# Patient Record
Sex: Female | Born: 1965 | Race: Black or African American | Hispanic: No | Marital: Single | Smoking: Never smoker
Health system: Southern US, Community
[De-identification: ages and names within clinical notes are randomized; demographics above are authoritative.]

## PROBLEM LIST (undated history)

## (undated) DIAGNOSIS — G8929 Other chronic pain: Secondary | ICD-10-CM

## (undated) DIAGNOSIS — E785 Hyperlipidemia, unspecified: Secondary | ICD-10-CM

## (undated) DIAGNOSIS — E669 Obesity, unspecified: Secondary | ICD-10-CM

## (undated) DIAGNOSIS — C801 Malignant (primary) neoplasm, unspecified: Secondary | ICD-10-CM

## (undated) DIAGNOSIS — I10 Essential (primary) hypertension: Secondary | ICD-10-CM

## (undated) DIAGNOSIS — Z8541 Personal history of malignant neoplasm of cervix uteri: Secondary | ICD-10-CM

## (undated) DIAGNOSIS — J45909 Unspecified asthma, uncomplicated: Secondary | ICD-10-CM

## (undated) DIAGNOSIS — Z8543 Personal history of malignant neoplasm of ovary: Secondary | ICD-10-CM

## (undated) HISTORY — DX: Obesity, unspecified: E66.9

## (undated) HISTORY — DX: Personal history of malignant neoplasm of cervix uteri: Z85.41

## (undated) HISTORY — DX: Personal history of malignant neoplasm of ovary: Z85.43

## (undated) HISTORY — DX: Unspecified asthma, uncomplicated: J45.909

## (undated) HISTORY — DX: Other chronic pain: G89.29

## (undated) HISTORY — DX: Hyperlipidemia, unspecified: E78.5

## (undated) HISTORY — PX: ABDOMINAL HYSTERECTOMY: SHX81

---

## 2009-04-14 ENCOUNTER — Encounter: Admission: RE | Admit: 2009-04-14 | Discharge: 2009-04-14 | Payer: Self-pay | Admitting: Internal Medicine

## 2010-05-01 ENCOUNTER — Emergency Department (HOSPITAL_COMMUNITY)
Admission: EM | Admit: 2010-05-01 | Discharge: 2010-05-01 | Payer: Self-pay | Source: Home / Self Care | Admitting: Emergency Medicine

## 2010-05-01 ENCOUNTER — Encounter (INDEPENDENT_AMBULATORY_CARE_PROVIDER_SITE_OTHER): Payer: Self-pay | Admitting: Emergency Medicine

## 2010-05-01 ENCOUNTER — Ambulatory Visit: Payer: Self-pay | Admitting: Surgery

## 2010-10-08 LAB — URIC ACID: Uric Acid, Serum: 8.2 mg/dL — ABNORMAL HIGH (ref 2.4–7.0)

## 2011-09-26 ENCOUNTER — Emergency Department (HOSPITAL_COMMUNITY): Payer: Medicare Other

## 2011-09-26 ENCOUNTER — Other Ambulatory Visit: Payer: Self-pay

## 2011-09-26 ENCOUNTER — Encounter (HOSPITAL_COMMUNITY): Payer: Self-pay | Admitting: Emergency Medicine

## 2011-09-26 ENCOUNTER — Emergency Department (HOSPITAL_COMMUNITY)
Admission: EM | Admit: 2011-09-26 | Discharge: 2011-09-26 | Disposition: A | Discharge status: Home / Self Care | Payer: Medicare Other | Attending: Emergency Medicine | Admitting: Emergency Medicine

## 2011-09-26 DIAGNOSIS — E119 Type 2 diabetes mellitus without complications: Secondary | ICD-10-CM | POA: Insufficient documentation

## 2011-09-26 DIAGNOSIS — E876 Hypokalemia: Secondary | ICD-10-CM

## 2011-09-26 DIAGNOSIS — I10 Essential (primary) hypertension: Secondary | ICD-10-CM | POA: Insufficient documentation

## 2011-09-26 DIAGNOSIS — R42 Dizziness and giddiness: Secondary | ICD-10-CM | POA: Insufficient documentation

## 2011-09-26 DIAGNOSIS — R5381 Other malaise: Secondary | ICD-10-CM | POA: Insufficient documentation

## 2011-09-26 DIAGNOSIS — Z79899 Other long term (current) drug therapy: Secondary | ICD-10-CM | POA: Insufficient documentation

## 2011-09-26 HISTORY — DX: Hyperlipidemia, unspecified: E78.5

## 2011-09-26 HISTORY — DX: Essential (primary) hypertension: I10

## 2011-09-26 HISTORY — DX: Malignant (primary) neoplasm, unspecified: C80.1

## 2011-09-26 LAB — DIFFERENTIAL
Basophils Absolute: 0 10*3/uL (ref 0.0–0.1)
Basophils Relative: 0 % (ref 0–1)
Eosinophils Absolute: 0.2 10*3/uL (ref 0.0–0.7)
Eosinophils Relative: 3 % (ref 0–5)
Monocytes Absolute: 0.5 10*3/uL (ref 0.1–1.0)
Neutro Abs: 5 10*3/uL (ref 1.7–7.7)
Neutrophils Relative %: 63 % (ref 43–77)

## 2011-09-26 LAB — POCT I-STAT, CHEM 8
BUN: 18 mg/dL (ref 6–23)
Chloride: 96 mEq/L (ref 96–112)
Creatinine, Ser: 1 mg/dL (ref 0.50–1.10)
Glucose, Bld: 87 mg/dL (ref 70–99)
HCT: 38 % (ref 36.0–46.0)
Hemoglobin: 12.9 g/dL (ref 12.0–15.0)
Potassium: 2.6 mEq/L — CL (ref 3.5–5.1)
Sodium: 140 mEq/L (ref 135–145)
TCO2: 33 mmol/L (ref 0–100)

## 2011-09-26 LAB — URINALYSIS, ROUTINE W REFLEX MICROSCOPIC
Bilirubin Urine: NEGATIVE
Glucose, UA: NEGATIVE mg/dL
Hgb urine dipstick: NEGATIVE
Ketones, ur: NEGATIVE mg/dL
Nitrite: NEGATIVE
Protein, ur: NEGATIVE mg/dL
Specific Gravity, Urine: 1.022 (ref 1.005–1.030)
Urobilinogen, UA: 1 mg/dL (ref 0.0–1.0)
pH: 6.5 (ref 5.0–8.0)

## 2011-09-26 LAB — COMPREHENSIVE METABOLIC PANEL
ALT: 15 U/L (ref 0–35)
AST: 21 U/L (ref 0–37)
Albumin: 3.9 g/dL (ref 3.5–5.2)
Alkaline Phosphatase: 83 U/L (ref 39–117)
Calcium: 9.9 mg/dL (ref 8.4–10.5)
Creatinine, Ser: 0.85 mg/dL (ref 0.50–1.10)
GFR calc Af Amer: >90 mL/min (ref >90)
Glucose, Bld: 87 mg/dL (ref 70–99)
Potassium: 2.5 mEq/L — CL (ref 3.5–5.1)
Sodium: 137 mEq/L (ref 135–145)
Total Bilirubin: 0.4 mg/dL (ref 0.3–1.2)
Total Protein: 8 g/dL (ref 6.0–8.3)

## 2011-09-26 LAB — GLUCOSE, CAPILLARY: Glucose-Capillary: 86 mg/dL (ref 70–99)

## 2011-09-26 LAB — CBC
MCH: 29.5 pg (ref 26.0–34.0)
MCHC: 34.3 g/dL (ref 30.0–36.0)
MCV: 86.1 fL (ref 78.0–100.0)
Platelets: 275 10*3/uL (ref 150–400)
RBC: 4.17 MIL/uL (ref 3.87–5.11)
RDW: 14.1 % (ref 11.5–15.5)

## 2011-09-26 MED ORDER — SODIUM CHLORIDE 0.9 % IV BOLUS (SEPSIS)
1000.0000 mL | Freq: Once | INTRAVENOUS | Status: AC
  Administered 2011-09-26: 1000 mL via INTRAVENOUS

## 2011-09-26 MED ORDER — POTASSIUM CHLORIDE CRYS ER 20 MEQ PO TBCR
40.0000 meq | EXTENDED_RELEASE_TABLET | Freq: Once | ORAL | Status: AC
  Administered 2011-09-26: 40 meq via ORAL
  Filled 2011-09-26: qty 2

## 2011-09-26 MED ORDER — MECLIZINE HCL 50 MG PO TABS: 50.0000 mg | ORAL_TABLET | Freq: Once | ORAL | Status: AC

## 2011-09-26 MED ORDER — POTASSIUM CHLORIDE 10 MEQ/100ML IV SOLN
10.0000 meq | Freq: Once | INTRAVENOUS | Status: AC
  Administered 2011-09-26: 10 meq via INTRAVENOUS
  Filled 2011-09-26: qty 100

## 2011-09-26 MED ORDER — MECLIZINE HCL 25 MG PO TABS
50.0000 mg | ORAL_TABLET | Freq: Once | ORAL | Status: AC
  Administered 2011-09-26: 50 mg via ORAL
  Filled 2011-09-26 (×2): qty 1

## 2011-09-26 NOTE — ED Notes (Signed)
VWU:[id number]<BJ> Expected date:09/26/11<BR> Expected time: 1:27 PM<BR> Means of arrival:Ambulance<BR> Comments:<BR> M4. 46 yo f. Dizzy, near syncope from church. 25 mins.

## 2011-09-26 NOTE — ED Notes (Signed)
Unable to tolerate IV potassium-stated it made her veins tight-MD informed-IV d/ced

## 2011-09-26 NOTE — Discharge Instructions (Signed)
Benign Positional Vertigo Vertigo means you feel like you or your surroundings are moving when they are not. Benign positional vertigo is the most common form of vertigo. Benign means that the cause of your condition is not serious. Benign positional vertigo is more common in older adults. CAUSES  Benign positional vertigo is the result of an upset in the labyrinth system. This is an area in the middle ear that helps control your balance. This may be caused by a viral infection, head injury, or repetitive motion. However, often no specific cause is found. SYMPTOMS  Symptoms of benign positional vertigo occur when you move your head or eyes in different directions. Some of the symptoms may include:  Loss of balance and falls.   Vomiting.   Blurred vision.   Dizziness.   Nausea.   Involuntary eye movements (nystagmus).  DIAGNOSIS  Benign positional vertigo is usually diagnosed by physical exam. If the specific cause of your benign positional vertigo is unknown, your caregiver may perform imaging tests, such as magnetic resonance imaging (MRI) or computed tomography (CT). TREATMENT  Your caregiver may recommend movements or procedures to correct the benign positional vertigo. Medicines such as meclizine, benzodiazepines, and medicines for nausea may be used to treat your symptoms. In rare cases, if your symptoms are caused by certain conditions that affect the inner ear, you may need surgery. HOME CARE INSTRUCTIONS   Follow your caregiver's instructions.   Move slowly. Do not make sudden body or head movements.   Avoid driving.   Avoid operating heavy machinery.   Avoid performing any tasks that would be dangerous to you or others during a vertigo episode.   Drink enough fluids to keep your urine clear or pale yellow.  SEEK IMMEDIATE MEDICAL CARE IF:   You develop problems with walking, weakness, numbness, or using your arms, hands, or legs.   You have difficulty speaking.   You  develop severe headaches.   Your nausea or vomiting continues or gets worse.   You develop visual changes.   Your family or friends notice any behavioral changes.   Your condition gets worse.   You have a fever.   You develop a stiff neck or sensitivity to light.  MAKE SURE YOU:   Understand these instructions.   Will watch your condition.   Will get help right away if you are not doing well or get worse.  Document Released: 04/19/2006 Document Revised: 07/01/2011 Document Reviewed: 04/01/2011 [facility] Patient Information 2012 [employer], [place].Hypokalemia Hypokalemia means a low potassium level in the blood.Potassium is an electrolyte that helps regulate the amount of fluid in the body. It also stimulates muscle contraction and maintains a stable acid-base balance.Most of the body's potassium is inside of cells, and only a very small amount is in the blood. Because the amount in the blood is so small, minor changes can have big effects. PREPARATION FOR TEST Testing for potassium requires taking a blood sample taken by needle from a vein in the arm. The skin is cleaned thoroughly before the sample is drawn. There is no other special preparation needed. NORMAL VALUES Potassium levels below 3.5 mEq/L are abnormally low. Levels above 5.1 mEq/L are abnormally high. Ranges for normal findings may vary among different laboratories and hospitals. You should always check with your doctor after having lab work or other tests done to discuss the meaning of your test results and whether your values are considered within normal limits. MEANING OF TEST  Your caregiver will go over  the test results with you and discuss the importance and meaning of your results, as well as treatment options and the need for additional tests, if necessary. A potassium level is frequently part of a routine medical exam. It is usually included as part of a whole "panel" of tests for several blood salts (such as Sodium  and Chloride). It may be done as part of follow-up when a low potassium level was found in the past or other blood salts are suspected of being out of balance. A low potassium level might be suspected if you have one or more of the following:  Symptoms of weakness.   Abnormal heart rhythms.   High blood pressure and are taking medication to control this, especially water pills (diuretics).   Kidney disease that can affect your potassium level .   Diabetes requiring the use of insulin. The potassium may fall after taking insulin, especially if the diabetes had been out of control for a while.   A condition requiring the use of cortisone-type medication or certain types of antibiotics.   Vomiting and/or diarrhea for more than a day or two.   A stomach or intestinal condition that may not permit appropriate absorption of potassium.   Fainting episodes.   Mental confusion.  OBTAINING TEST RESULTS It is your responsibility to obtain your test results. Ask the lab or department performing the test when and how you will get your results.  Please contact your caregiver directly if you have not received the results within one week. At that time, ask if there is anything different or new you should be doing in relation to the results. TREATMENT Hypokalemia can be treated with potassium supplements taken by mouth and/or adjustments in your current medications. A diet high in potassium is also helpful. Foods with high potassium content are:  Peas, lentils, lima beans, nuts, and dried fruit.   Whole grain and bran cereals and breads.   Fresh fruit, vegetables (bananas, cantaloupe, grapefruit, oranges, tomatoes, honeydew melons, potatoes).   Orange and tomato juices.   Meats. If potassium supplement has been prescribed for you today or your medications have been adjusted, see your personal caregiver in time02 for a re-check.  SEEK MEDICAL CARE IF:  There is a feeling of worsening  weakness.   You experience repeated chest palpitations.   You are diabetic and having difficulty keeping your blood sugars in the normal range.   You are experiencing vomiting and/or diarrhea.   You are having difficulty with any of your regular medications.  SEEK IMMEDIATE MEDICAL CARE IF:  You experience chest pain, shortness of breath, or episodes of dizziness.   You have been having vomiting or diarrhea for more than 2 days.   You have a fainting episode.  MAKE SURE YOU:   Understand these instructions.   Will watch your condition.   Will get help right away if you are not doing well or get worse.  Document Released: 07/12/2005 Document Revised: 07/01/2011 Document Reviewed: 06/22/2008 [employer] Patient Information 2012 [place], [place].

## 2011-09-26 NOTE — ED Notes (Signed)
Per EMS was at church and felt weak and dizzy upon standing

## 2011-09-26 NOTE — ED Notes (Signed)
Gave I Stat 8 results tp PA Thomasene Lot

## 2011-09-26 NOTE — ED Provider Notes (Signed)
History     CSN: [id number]  Arrival date & time 09/26/11  1405   First MD Initiated Contact with Patient 09/26/11 1501     HPI  Patient is a 46 y.o. female presenting with weakness.  Weakness The primary symptoms include dizziness. Primary symptoms do not include headaches, fever, nausea or vomiting.  Dizziness does not occur with nausea, vomiting or weakness.  Additional symptoms do not include weakness.    Past Medical History  Diagnosis Date  . Diabetes mellitus   . Hypertension   . Gout   . Hyperlipidemia   . Cancer     No past surgical history on file.  No family history on file.  History  Substance Use Topics  . Smoking status: Not on file  . Smokeless tobacco: Not on file  . Alcohol Use: No    OB History    Grav Para Term Preterm Abortions TAB SAB Ect Mult Living                  Review of Systems  Constitutional: Negative for fever and chills.  Respiratory: Negative for cough and shortness of breath.   Cardiovascular: Negative for chest pain.  Gastrointestinal: Negative for nausea, vomiting and abdominal pain.  Musculoskeletal: Negative for back pain.  Neurological: Positive for dizziness. Negative for facial asymmetry, speech difficulty, weakness, light-headedness, numbness and headaches.  All other systems reviewed and are negative.    Allergies  Ace inhibitors  Home Medications   Current Outpatient Rx  Name Route Sig Dispense Refill  . ALLOPURINOL 300 MG PO TABS Oral Take 300 mg by mouth daily.    . ATENOLOL 100 MG PO TABS Oral Take 100 mg by mouth daily.    . CHLORTHALIDONE 25 MG PO TABS Oral Take 25 mg by mouth daily.    . FUROSEMIDE 40 MG PO TABS Oral Take 40 mg by mouth daily.    Marland Kitchen LIRAGLUTIDE 18 MG/3ML [place] SOLN Subcutaneous Inject 1.8 mLs into the skin daily.    Marland Kitchen OVER THE COUNTER MEDICATION Oral Take 2 tablets by mouth daily. fiber    . POTASSIUM CHLORIDE CRYS ER 20 MEQ PO TBCR Oral Take 20 mEq by mouth daily.    Marland Kitchen ROSUVASTATIN  CALCIUM 10 MG PO TABS Oral Take 10 mg by mouth daily.    . TRAMADOL HCL 50 MG PO TABS Oral Take 50 mg by mouth daily.      BP 132/82  Pulse 71  Temp(Src) 98.2 F (36.8 C) (Oral)  Resp 22  SpO2 98%  Physical Exam  Vitals reviewed. Constitutional: She is oriented to person, place, and time. Vital signs are normal. She appears well-developed and well-nourished. No distress.  HENT:  Head: Normocephalic and atraumatic.  Right Ear: External ear normal.  Left Ear: External ear normal.  Nose: Nose normal.  Mouth/Throat: Oropharynx is clear and moist. No oropharyngeal exudate.  Eyes: Conjunctivae and EOM are normal. Pupils are equal, round, and reactive to light.  Neck: Neck supple.  Cardiovascular: Normal rate, regular rhythm and normal heart sounds.  Exam reveals no gallop and no friction rub.   No murmur heard. Pulmonary/Chest: Effort normal. She has no wheezes. She has no rales. She exhibits no tenderness.  Abdominal: Soft. Bowel sounds are normal. She exhibits no distension. There is no tenderness.  Lymphadenopathy:    She has no cervical adenopathy.  Neurological: She is alert and oriented to person, place, and time. No cranial nerve deficit. She exhibits normal muscle tone.  Coordination normal.       Symptoms reproducible when sitting patient up  Skin: Skin is warm and dry. No rash noted. No erythema. No pallor.  Psychiatric: She has a normal mood and affect. Her behavior is normal.    ED Course  Procedures   Results for orders placed during the hospital encounter of 09/26/11  GLUCOSE, CAPILLARY      Component Value Range   Glucose-Capillary 86  70 - 99 (mg/dL)   Comment 1 Notify RN    CBC      Component Value Range   WBC 7.9  4.0 - 10.5 (K/uL)   RBC 4.17  3.87 - 5.11 (MIL/uL)   Hemoglobin 12.3  12.0 - 15.0 (g/dL)   HCT 40.1 (*) 02.7 - 46.0 (%)   MCV 86.1  78.0 - 100.0 (fL)   MCH 29.5  26.0 - 34.0 (pg)   MCHC 34.3  30.0 - 36.0 (g/dL)   RDW 25.3  66.4 - 40.3 (%)    Platelets 275  150 - 400 (K/uL)  DIFFERENTIAL      Component Value Range   Neutrophils Relative 63  43 - 77 (%)   Neutro Abs 5.0  1.7 - 7.7 (K/uL)   Lymphocytes Relative 28  12 - 46 (%)   Lymphs Abs 2.2  0.7 - 4.0 (K/uL)   Monocytes Relative 7  3 - 12 (%)   Monocytes Absolute 0.5  0.1 - 1.0 (K/uL)   Eosinophils Relative 3  0 - 5 (%)   Eosinophils Absolute 0.2  0.0 - 0.7 (K/uL)   Basophils Relative 0  0 - 1 (%)   Basophils Absolute 0.0  0.0 - 0.1 (K/uL)  COMPREHENSIVE METABOLIC PANEL      Component Value Range   Sodium 137  135 - 145 (mEq/L)   Potassium 2.5 (*) 3.5 - 5.1 (mEq/L)   Chloride 94 (*) 96 - 112 (mEq/L)   CO2 35 (*) 19 - 32 (mEq/L)   Glucose, Bld 87  70 - 99 (mg/dL)   BUN 18  6 - 23 (mg/dL)   Creatinine, Ser 4.74  0.50 - 1.10 (mg/dL)   Calcium 9.9  8.4 - 25.9 (mg/dL)   Total Protein 8.0  6.0 - 8.3 (g/dL)   Albumin 3.9  3.5 - 5.2 (g/dL)   AST 21  0 - 37 (U/L)   ALT 15  0 - 35 (U/L)   Alkaline Phosphatase 83  39 - 117 (U/L)   Total Bilirubin 0.4  0.3 - 1.2 (mg/dL)   GFR calc non Af Amer 81 (*) >90 (mL/min)   GFR calc Af Amer >90  >90 (mL/min)  URINALYSIS, ROUTINE W REFLEX MICROSCOPIC      Component Value Range   Color, Urine YELLOW  YELLOW    APPearance CLEAR  CLEAR    Specific Gravity, Urine 1.022  1.005 - 1.030    pH 6.5  5.0 - 8.0    Glucose, UA NEGATIVE  NEGATIVE (mg/dL)   Hgb urine dipstick NEGATIVE  NEGATIVE    Bilirubin Urine NEGATIVE  NEGATIVE    Ketones, ur NEGATIVE  NEGATIVE (mg/dL)   Protein, ur NEGATIVE  NEGATIVE (mg/dL)   Urobilinogen, UA 1.0  0.0 - 1.0 (mg/dL)   Nitrite NEGATIVE  NEGATIVE    Leukocytes, UA NEGATIVE  NEGATIVE   POCT I-STAT, CHEM 8      Component Value Range   Sodium 140  135 - 145 (mEq/L)   Potassium 2.6 (*) 3.5 - 5.1 (mEq/L)  Chloride 96  96 - 112 (mEq/L)   BUN 18  6 - 23 (mg/dL)   Creatinine, Ser 1.61  0.50 - 1.10 (mg/dL)   Glucose, Bld 87  70 - 99 (mg/dL)   Calcium, Ion 0.96  0.45 - 1.32 (mmol/L)   TCO2 33  0 - 100  (mmol/L)   Hemoglobin 12.9  12.0 - 15.0 (g/dL)   HCT 40.9  81.1 - 91.4 (%)   Comment NOTIFIED PHYSICIAN     Ct Head Wo Contrast  09/26/2011  *RADIOLOGY REPORT*  Clinical Data: Weak and dizzy  CT HEAD WITHOUT CONTRAST  Technique:  Contiguous axial images were obtained from the base of the skull through the vertex without contrast.  Comparison: None.  Findings: Ventricle size is normal.  Negative for hemorrhage, mass, or infarct.  No edema or mass effect is present.  Calvarium is intact.  IMPRESSION: Normal  Original Report Authenticated By: Camelia Phenes, M.D.     Date: 09/26/2011  Rate: 57  Rhythm: normal sinus rhythm  QRS Axis: normal  Intervals: normal  ST/T Wave abnormalities: normal  Conduction Disutrbances: none  Narrative Interpretation: NML    MDM    5:20 PM Patient reports symptoms significantly improved after medication. Patient ambulated without difficulty and without vertigo. Will discharge home and advised to take meclizine. 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 Patient reports IV was uncomfortable and requests to have it removed prior to having potassium infusion. She has hypokalemia. Has not had EKG changes. Patient states she did not take her potassium is 4. Advised taking her regular dose when she returns home as well. Pt voices understanding and is ready for discharge.      Thomasene Lot, PA-C 09/26/11 1748  Thomasene Lot, PA-C 09/26/11 1749

## 2011-09-27 NOTE — ED Provider Notes (Signed)
Medical screening examination/treatment/procedure(s) were performed by non-physician practitioner and as supervising physician I was immediately available for consultation/collaboration.  Gerhard Munch, MD 09/27/11 Rich Fuchs

## 2015-10-16 DIAGNOSIS — R0602 Shortness of breath: Secondary | ICD-10-CM | POA: Diagnosis not present

## 2015-10-16 DIAGNOSIS — G43909 Migraine, unspecified, not intractable, without status migrainosus: Secondary | ICD-10-CM | POA: Diagnosis not present

## 2015-10-16 DIAGNOSIS — E1140 Type 2 diabetes mellitus with diabetic neuropathy, unspecified: Secondary | ICD-10-CM | POA: Diagnosis not present

## 2015-10-16 DIAGNOSIS — I10 Essential (primary) hypertension: Secondary | ICD-10-CM | POA: Diagnosis not present

## 2015-10-21 ENCOUNTER — Other Ambulatory Visit: Payer: Self-pay

## 2015-10-21 DIAGNOSIS — Z1231 Encounter for screening mammogram for malignant neoplasm of breast: Secondary | ICD-10-CM

## 2015-10-24 ENCOUNTER — Ambulatory Visit
Admission: RE | Admit: 2015-10-24 | Discharge: 2015-10-24 | Disposition: A | Discharge status: Home / Self Care | Payer: Medicare Other | Source: Ambulatory Visit

## 2015-10-24 DIAGNOSIS — Z1231 Encounter for screening mammogram for malignant neoplasm of breast: Secondary | ICD-10-CM | POA: Diagnosis not present

## 2015-11-12 DIAGNOSIS — J029 Acute pharyngitis, unspecified: Secondary | ICD-10-CM | POA: Diagnosis not present

## 2015-11-12 DIAGNOSIS — R0982 Postnasal drip: Secondary | ICD-10-CM | POA: Diagnosis not present

## 2015-11-19 DIAGNOSIS — E7800 Pure hypercholesterolemia, unspecified: Secondary | ICD-10-CM | POA: Diagnosis not present

## 2015-11-19 DIAGNOSIS — R0602 Shortness of breath: Secondary | ICD-10-CM | POA: Diagnosis not present

## 2015-11-19 DIAGNOSIS — I10 Essential (primary) hypertension: Secondary | ICD-10-CM | POA: Diagnosis not present

## 2015-11-19 DIAGNOSIS — R079 Chest pain, unspecified: Secondary | ICD-10-CM | POA: Diagnosis not present

## 2015-11-24 DIAGNOSIS — R0789 Other chest pain: Secondary | ICD-10-CM | POA: Diagnosis not present

## 2015-12-02 DIAGNOSIS — R0609 Other forms of dyspnea: Secondary | ICD-10-CM | POA: Diagnosis not present

## 2015-12-09 DIAGNOSIS — R0602 Shortness of breath: Secondary | ICD-10-CM | POA: Diagnosis not present

## 2015-12-09 DIAGNOSIS — I10 Essential (primary) hypertension: Secondary | ICD-10-CM | POA: Diagnosis not present

## 2015-12-09 DIAGNOSIS — R079 Chest pain, unspecified: Secondary | ICD-10-CM | POA: Diagnosis not present

## 2015-12-09 DIAGNOSIS — E7800 Pure hypercholesterolemia, unspecified: Secondary | ICD-10-CM | POA: Diagnosis not present

## 2016-02-10 DIAGNOSIS — Z0000 Encounter for general adult medical examination without abnormal findings: Secondary | ICD-10-CM | POA: Diagnosis not present

## 2016-02-10 DIAGNOSIS — E559 Vitamin D deficiency, unspecified: Secondary | ICD-10-CM | POA: Diagnosis not present

## 2016-02-10 DIAGNOSIS — R413 Other amnesia: Secondary | ICD-10-CM | POA: Diagnosis not present

## 2016-02-10 DIAGNOSIS — I10 Essential (primary) hypertension: Secondary | ICD-10-CM | POA: Diagnosis not present

## 2016-02-10 DIAGNOSIS — E1140 Type 2 diabetes mellitus with diabetic neuropathy, unspecified: Secondary | ICD-10-CM | POA: Diagnosis not present

## 2017-03-20 ENCOUNTER — Inpatient Hospital Stay: Payer: Self-pay

## 2017-03-20 ENCOUNTER — Encounter: Payer: Self-pay | Admitting: Emergency Medicine

## 2017-03-20 ENCOUNTER — Observation Stay
Admission: EM | Admit: 2017-03-20 | Discharge: 2017-03-21 | Disposition: A | Discharge status: Home / Self Care | Payer: Self-pay | Attending: Internal Medicine | Admitting: Internal Medicine

## 2017-03-20 DIAGNOSIS — R0600 Dyspnea, unspecified: Secondary | ICD-10-CM

## 2017-03-20 DIAGNOSIS — L509 Urticaria, unspecified: Secondary | ICD-10-CM | POA: Insufficient documentation

## 2017-03-20 DIAGNOSIS — E119 Type 2 diabetes mellitus without complications: Secondary | ICD-10-CM | POA: Insufficient documentation

## 2017-03-20 DIAGNOSIS — T783XXA Angioneurotic edema, initial encounter: Secondary | ICD-10-CM

## 2017-03-20 DIAGNOSIS — Z79899 Other long term (current) drug therapy: Secondary | ICD-10-CM | POA: Insufficient documentation

## 2017-03-20 DIAGNOSIS — J0510 Acute epiglottitis without obstruction: Secondary | ICD-10-CM

## 2017-03-20 DIAGNOSIS — Z794 Long term (current) use of insulin: Secondary | ICD-10-CM | POA: Insufficient documentation

## 2017-03-20 DIAGNOSIS — T7840XA Allergy, unspecified, initial encounter: Principal | ICD-10-CM | POA: Insufficient documentation

## 2017-03-20 DIAGNOSIS — I10 Essential (primary) hypertension: Secondary | ICD-10-CM | POA: Insufficient documentation

## 2017-03-20 DIAGNOSIS — M109 Gout, unspecified: Secondary | ICD-10-CM | POA: Insufficient documentation

## 2017-03-20 DIAGNOSIS — R061 Stridor: Secondary | ICD-10-CM

## 2017-03-20 DIAGNOSIS — Z7982 Long term (current) use of aspirin: Secondary | ICD-10-CM | POA: Insufficient documentation

## 2017-03-20 DIAGNOSIS — E785 Hyperlipidemia, unspecified: Secondary | ICD-10-CM | POA: Insufficient documentation

## 2017-03-20 DIAGNOSIS — T782XXA Anaphylactic shock, unspecified, initial encounter: Secondary | ICD-10-CM | POA: Insufficient documentation

## 2017-03-20 LAB — CBC
HCT: 41.5 % (ref 35.0–47.0)
Hemoglobin: 14.2 g/dL (ref 12.0–16.0)
MCH: 28.9 pg (ref 26.0–34.0)
MCHC: 34.3 g/dL (ref 32.0–36.0)
MCV: 84.2 fL (ref 80.0–100.0)
PLATELETS: 371 10*3/uL (ref 150–440)
RBC: 4.93 MIL/uL (ref 3.80–5.20)
RDW: 14.6 % — ABNORMAL HIGH (ref 11.5–14.5)
WBC: 10.2 10*3/uL (ref 3.6–11.0)

## 2017-03-20 LAB — BASIC METABOLIC PANEL
ANION GAP: 8 (ref 5–15)
BUN: 16 mg/dL (ref 6–20)
CO2: 24 mmol/L (ref 22–32)
Calcium: 8.7 mg/dL — ABNORMAL LOW (ref 8.9–10.3)
Chloride: 106 mmol/L (ref 101–111)
Creatinine, Ser: 0.8 mg/dL (ref 0.44–1.00)
GFR calc Af Amer: >60 mL/min (ref >60)
GFR calc non Af Amer: >60 mL/min (ref >60)
GLUCOSE: 137 mg/dL — AB (ref 65–99)
POTASSIUM: 3.9 mmol/L (ref 3.5–5.1)
SODIUM: 138 mmol/L (ref 135–145)

## 2017-03-20 LAB — GLUCOSE, CAPILLARY
Glucose-Capillary: 166 mg/dL — ABNORMAL HIGH (ref 65–99)
Glucose-Capillary: 198 mg/dL — ABNORMAL HIGH (ref 65–99)

## 2017-03-20 MED ORDER — TRAMADOL HCL 50 MG PO TABS
50.0000 mg | ORAL_TABLET | Freq: Every day | ORAL | Status: DC
  Administered 2017-03-20 – 2017-03-21 (×2): 50 mg via ORAL
  Filled 2017-03-20 (×2): qty 1

## 2017-03-20 MED ORDER — ATENOLOL 100 MG PO TABS
100.0000 mg | ORAL_TABLET | Freq: Every day | ORAL | Status: DC
  Administered 2017-03-20 – 2017-03-21 (×2): 100 mg via ORAL
  Filled 2017-03-20 (×2): qty 1

## 2017-03-20 MED ORDER — EPINEPHRINE 0.3 MG/0.3ML IJ SOAJ
INTRAMUSCULAR | Status: AC
  Filled 2017-03-20: qty 0.3

## 2017-03-20 MED ORDER — EPINEPHRINE 0.3 MG/0.3ML IJ SOAJ
INTRAMUSCULAR | Status: AC
  Administered 2017-03-20: 0.3 mg via INTRAMUSCULAR
  Filled 2017-03-20: qty 0.3

## 2017-03-20 MED ORDER — INSULIN ASPART 100 UNIT/ML ~~LOC~~ SOLN
0.0000 [IU] | Freq: Three times a day (TID) | SUBCUTANEOUS | Status: DC
  Administered 2017-03-20: 2 [IU] via SUBCUTANEOUS
  Administered 2017-03-21 (×2): 3 [IU] via SUBCUTANEOUS
  Filled 2017-03-20 (×3): qty 1

## 2017-03-20 MED ORDER — EPINEPHRINE PF 1 MG/ML IJ SOLN
INTRAMUSCULAR | Status: AC
  Filled 2017-03-20: qty 1

## 2017-03-20 MED ORDER — SODIUM CHLORIDE 0.9 % IV SOLN
25.0000 mg | INTRAVENOUS | Status: DC | PRN
  Administered 2017-03-21: 25 mg via INTRAVENOUS
  Filled 2017-03-20: qty 0.5

## 2017-03-20 MED ORDER — EPINEPHRINE 0.3 MG/0.3ML IJ SOAJ
0.3000 mg | Freq: Once | INTRAMUSCULAR | Status: AC
  Administered 2017-03-20: 0.3 mg via INTRAMUSCULAR

## 2017-03-20 MED ORDER — ASPIRIN EC 81 MG PO TBEC
81.0000 mg | DELAYED_RELEASE_TABLET | Freq: Every day | ORAL | Status: DC
  Administered 2017-03-20 – 2017-03-21 (×2): 81 mg via ORAL
  Filled 2017-03-20 (×2): qty 1

## 2017-03-20 MED ORDER — ALLOPURINOL 300 MG PO TABS
300.0000 mg | ORAL_TABLET | Freq: Every day | ORAL | Status: DC
  Administered 2017-03-20 – 2017-03-21 (×2): 300 mg via ORAL
  Filled 2017-03-20 (×2): qty 1

## 2017-03-20 MED ORDER — ALBUTEROL SULFATE (2.5 MG/3ML) 0.083% IN NEBU
2.5000 mg | INHALATION_SOLUTION | Freq: Once | RESPIRATORY_TRACT | Status: AC
  Administered 2017-03-20: 2.5 mg via RESPIRATORY_TRACT
  Filled 2017-03-20: qty 3

## 2017-03-20 MED ORDER — FAMOTIDINE IN NACL 20-0.9 MG/50ML-% IV SOLN
20.0000 mg | Freq: Once | INTRAVENOUS | Status: AC
  Administered 2017-03-20: 20 mg via INTRAVENOUS

## 2017-03-20 MED ORDER — POTASSIUM 99 MG PO TABS: 1.0000 | ORAL_TABLET | Freq: Every day | ORAL | Status: DC

## 2017-03-20 MED ORDER — CHLORTHALIDONE 25 MG PO TABS
25.0000 mg | ORAL_TABLET | Freq: Every day | ORAL | Status: DC
  Administered 2017-03-20 – 2017-03-21 (×2): 25 mg via ORAL
  Filled 2017-03-20 (×2): qty 1

## 2017-03-20 MED ORDER — METHYLPREDNISOLONE SODIUM SUCC 125 MG IJ SOLR
60.0000 mg | Freq: Three times a day (TID) | INTRAMUSCULAR | Status: DC
  Administered 2017-03-20 – 2017-03-21 (×3): 60 mg via INTRAVENOUS
  Filled 2017-03-20 (×3): qty 2

## 2017-03-20 MED ORDER — ACETAMINOPHEN 650 MG RE SUPP: 650.0000 mg | Freq: Four times a day (QID) | RECTAL | Status: DC | PRN

## 2017-03-20 MED ORDER — DIPHENHYDRAMINE HCL 50 MG/ML IJ SOLN
50.0000 mg | Freq: Once | INTRAMUSCULAR | Status: AC
  Administered 2017-03-20: 50 mg via INTRAVENOUS

## 2017-03-20 MED ORDER — METHYLPREDNISOLONE SODIUM SUCC 125 MG IJ SOLR
125.0000 mg | Freq: Once | INTRAMUSCULAR | Status: AC
  Administered 2017-03-20: 125 mg via INTRAVENOUS

## 2017-03-20 MED ORDER — LIRAGLUTIDE 18 MG/3ML ~~LOC~~ SOLN: 10.8000 mg | Freq: Every day | SUBCUTANEOUS | Status: DC

## 2017-03-20 MED ORDER — POTASSIUM CHLORIDE CRYS ER 20 MEQ PO TBCR
20.0000 meq | EXTENDED_RELEASE_TABLET | Freq: Every day | ORAL | Status: DC
  Administered 2017-03-20 – 2017-03-21 (×2): 20 meq via ORAL
  Filled 2017-03-20 (×2): qty 1

## 2017-03-20 MED ORDER — FUROSEMIDE 40 MG PO TABS
40.0000 mg | ORAL_TABLET | Freq: Every day | ORAL | Status: DC
  Administered 2017-03-20 – 2017-03-21 (×2): 40 mg via ORAL
  Filled 2017-03-20 (×2): qty 1

## 2017-03-20 MED ORDER — METHYLPREDNISOLONE SODIUM SUCC 125 MG IJ SOLR
INTRAMUSCULAR | Status: AC
  Filled 2017-03-20: qty 2

## 2017-03-20 MED ORDER — EPINEPHRINE 0.3 MG/0.3ML IJ SOAJ
0.3000 mg | INTRAMUSCULAR | Status: DC | PRN
  Filled 2017-03-20: qty 0.3

## 2017-03-20 MED ORDER — ACETAMINOPHEN 325 MG PO TABS: 650.0000 mg | ORAL_TABLET | Freq: Four times a day (QID) | ORAL | Status: DC | PRN

## 2017-03-20 MED ORDER — ROSUVASTATIN CALCIUM 10 MG PO TABS
10.0000 mg | ORAL_TABLET | Freq: Every day | ORAL | Status: DC
  Administered 2017-03-20 – 2017-03-21 (×2): 10 mg via ORAL
  Filled 2017-03-20 (×2): qty 1

## 2017-03-20 MED ORDER — FAMOTIDINE 20 MG PO TABS
20.0000 mg | ORAL_TABLET | Freq: Two times a day (BID) | ORAL | Status: DC
  Administered 2017-03-20 – 2017-03-21 (×2): 20 mg via ORAL
  Filled 2017-03-20 (×2): qty 1

## 2017-03-20 MED ORDER — FERROUS SULFATE 325 (65 FE) MG PO TABS
325.0000 mg | ORAL_TABLET | Freq: Every day | ORAL | Status: DC
  Administered 2017-03-21: 325 mg via ORAL
  Filled 2017-03-20: qty 1

## 2017-03-20 MED ORDER — DIPHENHYDRAMINE HCL 50 MG/ML IJ SOLN
INTRAMUSCULAR | Status: AC
  Filled 2017-03-20: qty 1

## 2017-03-20 MED ORDER — ENOXAPARIN SODIUM 40 MG/0.4ML ~~LOC~~ SOLN
40.0000 mg | Freq: Two times a day (BID) | SUBCUTANEOUS | Status: DC
  Administered 2017-03-20 – 2017-03-21 (×2): 40 mg via SUBCUTANEOUS
  Filled 2017-03-20 (×2): qty 0.4

## 2017-03-20 MED ORDER — SENNOSIDES-DOCUSATE SODIUM 8.6-50 MG PO TABS: 1.0000 | ORAL_TABLET | Freq: Every evening | ORAL | Status: DC | PRN

## 2017-03-20 MED ORDER — EPINEPHRINE PF 1 MG/10ML IJ SOSY
PREFILLED_SYRINGE | INTRAMUSCULAR | Status: AC
  Filled 2017-03-20: qty 10

## 2017-03-20 MED ORDER — FAMOTIDINE IN NACL 20-0.9 MG/50ML-% IV SOLN
INTRAVENOUS | Status: AC
  Administered 2017-03-20: 20 mg via INTRAVENOUS
  Filled 2017-03-20: qty 50

## 2017-03-20 MED ORDER — DIPHENHYDRAMINE HCL 50 MG/ML IJ SOLN
50.0000 mg | Freq: Once | INTRAMUSCULAR | Status: AC
  Administered 2017-03-20: 50 mg via INTRAVENOUS
  Filled 2017-03-20: qty 1

## 2017-03-20 MED ORDER — ALBUTEROL SULFATE (2.5 MG/3ML) 0.083% IN NEBU
2.5000 mg | INHALATION_SOLUTION | RESPIRATORY_TRACT | Status: DC | PRN
  Administered 2017-03-20: 2.5 mg via RESPIRATORY_TRACT
  Filled 2017-03-20: qty 3

## 2017-03-20 NOTE — H&P (Signed)
[facility] [place] NAME: Nancy Pham    MR#:  [id number]  DATE OF BIRTH:  November 15, 1965  DATE OF ADMISSION:  03/20/2017  PRIMARY CARE PHYSICIAN: Patient, No Pcp Per   REQUESTING/REFERRING PHYSICIAN: Rip Harbour  CHIEF COMPLAINT:  Hives, swelling of the face  HISTORY OF PRESENT ILLNESS:  Nancy Pham  is a 51 y.o. female with a known history of diabetes, hyperlipidemia,hypertension,asthma and obesity is presenting to the ED with a chief complaint of peptic reaction. Patient had a new brand of pineapple juice on Friday following which she noticed hives on her body. Patient took Benadryl multiple times on Friday night and started feeling better on Saturday, but by Saturday evening she started noticing hives and swelling of her face eyelids, lips associated with the whole body itching and tightness in her chest. As patient was short of breath she came to the ED today morning  patient was given EpiPen,Solu-Medrol, [place] Pepcid and hospitalist team is called to admit the patient. During my examination patient is reporting tightness in her chest associated with hoarseness of voice. Reports difficulty swallowing  PAST MEDICAL HISTORY:   Past Medical History:  Diagnosis Date  . Cancer ([place])   . Diabetes mellitus   . Gout   . Hyperlipidemia   . Hypertension     PAST SURGICAL HISTOIRY:  History reviewed. No pertinent surgical history.  SOCIAL HISTORY:   Social History  Substance Use Topics  . Smoking status: Not on file  . Smokeless tobacco: Not on file  . Alcohol use No    FAMILY HISTORY:  History reviewed. No pertinent family history.  DRUG ALLERGIES:   Allergies  Allergen Reactions  . Ace Inhibitors Swelling    REVIEW OF SYSTEMS:  CONSTITUTIONAL: No fever, fatigue or weakness.  EYES: No blurred or double vision.  EARS, NOSE, AND THROAT: No tinnitus or ear pain. Reporting hoarse voice RESPIRATORY: No cough,patient is  reporting tightness in her chest and shortness of breath, reporting minimalwheezing , no hemoptysis.  CARDIOVASCULAR: No chest pain, orthopnea, edema.  GASTROINTESTINAL: No nausea, vomiting, diarrhea or abdominal pain.  GENITOURINARY: No dysuria, hematuria.  ENDOCRINE: No polyuria, nocturia,  HEMATOLOGY: No anemia, easy bruising or bleeding SKIN: reporting diffuse rash with itching MUSCULOSKELETAL: No joint pain or arthritis.   NEUROLOGIC: No tingling, numbness, weakness.  PSYCHIATRY: No anxiety or depression.   MEDICATIONS AT HOME:   Prior to Admission medications   Medication Sig Start Date End Date Taking? Authorizing Provider  aspirin EC 81 MG tablet Take 81 mg by mouth.   Yes [provider]  atenolol (TENORMIN) 100 MG tablet Take 100 mg by mouth daily.   Yes [provider]  ferrous sulfate 325 (65 FE) MG tablet Take 325 mg by mouth daily with breakfast.   Yes [provider]  Liraglutide (VICTOZA) 18 MG/3ML SOLN Inject 1.8 mLs into the skin daily.   Yes [provider]  Potassium 99 MG TABS Take 1 tablet by mouth daily.   Yes [provider]  traMADol (ULTRAM) 50 MG tablet Take 50 mg by mouth daily.   Yes [provider]  allopurinol (ZYLOPRIM) 300 MG tablet Take 300 mg by mouth daily.    [provider]  chlorthalidone (HYGROTON) 25 MG tablet Take 25 mg by mouth daily.    [provider]  furosemide (LASIX) 40 MG tablet Take 40 mg by mouth daily.    [provider]  potassium chloride SA (  K-DUR,KLOR-CON) 20 MEQ tablet Take 20 mEq by mouth daily.    [provider]  rosuvastatin (CRESTOR) 10 MG tablet Take 10 mg by mouth daily.    [provider]      VITAL SIGNS:  Blood pressure 116/80, pulse 83, resp. rate 17, SpO2 95 %.  PHYSICAL EXAMINATION:  GENERAL:  51 y.o.-year-old patient lying in the bed with no acute distress.  EYES: Pupils equal, round, reactive to light and  accommodation. No scleral icterus. Extraocular muscles intact.  HEENT: Head atraumatic, normocephalic. Oropharynx and nasopharynx clear. Lips and eyelids are edematous No tongue swelling noticed, hoarse voice NECK:  Supple, no jugular venous distention. No thyroid enlargement, no tenderness.  LUNGS: moderatebreath sounds bilaterally, diffuse wheezing, no rales,rhonchi or crepitation. No use of accessory muscles of respiration.  CARDIOVASCULAR: S1, S2 normal. No murmurs, rubs, or gallops.  ABDOMEN: Soft, nontender, nondistended. Bowel sounds present. No organomegaly or mass.  EXTREMITIES: No pedal edema, cyanosis, or clubbing.  NEUROLOGIC: Cranial nerves II through XII are intact. Muscle strength 5/5 in all extremities. Sensation intact. Gait not checked.  PSYCHIATRIC: The patient is alert and ORIENTED SKIN : DIFFUSE HIVES  LABORATORY PANEL:   CBC  Recent Labs Lab 03/20/17 1059  WBC 10.2  HGB 14.2  HCT 41.5  PLT 371   ------------------------------------------------------------------------------------------------------------------  Chemistries   Recent Labs Lab 03/20/17 1059  NA 138  K 3.9  CL 106  CO2 24  GLUCOSE 137*  BUN 16  CREATININE 0.80  CALCIUM 8.7*   ------------------------------------------------------------------------------------------------------------------  Cardiac Enzymes No results for input(s): TROPONINI in the last 168 hours. ------------------------------------------------------------------------------------------------------------------  RADIOLOGY:  Dg Chest Port 1 View  Result Date: 03/20/2017 CLINICAL DATA:  Wheezing.  Epiglottitis.  Dyspnea. EXAM: PORTABLE CHEST 1 VIEW COMPARISON:  None. FINDINGS: Both lungs are clear. Heart and mediastinum are within normal limits. The trachea is midline. No acute bone abnormality. IMPRESSION: No active disease. Electronically Signed   By: Markus Daft M.D.   On: 03/20/2017 12:04    EKG:   Orders placed or  performed during the hospital encounter of 09/26/11  . EKG    IMPRESSION AND PLAN:  Nancy Pham  is a 51 y.o. female with a known history of diabetes, hyperlipidemia,hypertension,asthma and obesity is presenting to the ED with a chief complaint of peptic reaction. Patient had a new brand of pineapple juice on Friday following which she noticed hives on her body   #anaphylactic reaction Admit to telemetry with close monitoring Patient has received EpiPen , Solu-Medrol, Pepcid and Benadryl in the ED We'll continue IV Solu-Medrol, Pepcid and Benadryl as needed Follow-up with intensivist Follow-up Urticaria with angioedema workup with tryptase level, C1 esterase level, C4 Albuterol nebulizer treatments as needed for shortness of breath   #asthma exacerbation triggered by anaphylactic reaction Solu-Medrol, and Nebulizer treatments   #Insulin requiring diabetes mellitus Continue victoza n ISS Check hemoglobin A1c level in a.m.  #Essential hypertension Continue home medications atenolol   #Hyperlipidemia-continue home med Crestor  GI prophylaxis with Pepcid DVT prophylaxis with Lovenox subcutaneous  All the records are reviewed and case discussed with ED provider. Management plans discussed with the patient, family and they are in agreement.  CODE STATUS: fc,healthcare power of attorney [place] TAKING CARE OF THIS PATIENT: 50  minutes.   Note: This dictation was prepared with Dragon dictation along with smaller phrase technology. Any transcriptional errors that result from this process are unintentional.  Nicholes Mango M.D on 03/20/2017  at 12:09 PM  Between 7am to 6pm - Pager - [phone]  After 6pm go to [email or web address] - password [email or web address] [facility]  Office  [phone]  CC: Primary care physician; Patient, No Pcp Per

## 2017-03-20 NOTE — Progress Notes (Signed)
Pharmacist - Prescriber Communication   Enoxaparin dose modified from 40 mg subcutaneously once daily to 40 mg subcutaneously twice daily due to BMI > 40.  Arthur Speagle A. [place], [place].D., BCPS Clinical Pharmacist 03/20/2017 15:23

## 2017-03-20 NOTE — ED Notes (Signed)
Admitting MD notified of pt passing swallow screen

## 2017-03-20 NOTE — ED Notes (Signed)
Pt states tightness all over and throat swelling.

## 2017-03-20 NOTE — Consult Note (Signed)
[facility] Consultation    ASSESSMENT/PLAN   Patient with prior history of ACE inhibitor and induced angioedema now presents with urticaria with globus sensation in the back of her throat. Agree with close monitoring, will check chest x-ray and lateral neck film, agree with Solu-Medrol, H2 blockers, Benadryl as needed. Patient has received epinephrine.Further workup of her urticaria with angioedema will check a tryptase level, C1 esterase level,C4, C1q. If patient's respiratory status worsens will move to the intensive care unit but at this point widely patent airway  Name: Nancy Pham MRN: [id number] DOB: 04/07/1966    ADMISSION DATE:  03/20/2017 CONSULTATION DATE: 03/20/2017  REFERRING MD :  Dr. Margaretmary Eddy  CHIEF COMPLAINT:  Itching and swelling   HISTORY OF PRESENT ILLNESS:    Nancy Pham is a 51 y.o. female with a past medical history remarkable for hypertension, hyperlipidemia, diabetes, gout who has a prior history of ACE inhibitor induced angioedema developed hives on Friday. She also had some facial swelling and sensation with throat closing up. She took Benadryl got better but then when she woke up this morning she was swollen all over itchy all over her throat felt closing up again and she had some trouble with hoarseness. She had some pineapple drink Friday before this started but there are no new medications or any other unusual foods no new detergents etc. In the emergency department she received appropriate therapy to include steroids, Benadryl, epinephrine, H2 blockers. On exam she has a widely patent airway, minimal tongue swelling, still complaining of diffuse itching stable oxygen saturations without labored breathi  PAST MEDICAL HISTORY :  Past Medical History:  Diagnosis Date  . Cancer ([place])   . Diabetes mellitus   . Gout   . Hyperlipidemia   . Hypertension    History reviewed. No pertinent surgical history. Prior to Admission medications     Medication Sig Start Date End Date Taking? Authorizing Provider  aspirin EC 81 MG tablet Take 81 mg by mouth.   Yes [provider]  atenolol (TENORMIN) 100 MG tablet Take 100 mg by mouth daily.   Yes [provider]  ferrous sulfate 325 (65 FE) MG tablet Take 325 mg by mouth daily with breakfast.   Yes [provider]  Liraglutide (VICTOZA) 18 MG/3ML SOLN Inject 1.8 mLs into the skin daily.   Yes [provider]  Potassium 99 MG TABS Take 1 tablet by mouth daily.   Yes [provider]  traMADol (ULTRAM) 50 MG tablet Take 50 mg by mouth daily.   Yes [provider]  allopurinol (ZYLOPRIM) 300 MG tablet Take 300 mg by mouth daily.    [provider]  chlorthalidone (HYGROTON) 25 MG tablet Take 25 mg by mouth daily.    [provider]  furosemide (LASIX) 40 MG tablet Take 40 mg by mouth daily.    [provider]  potassium chloride SA (K-DUR,KLOR-CON) 20 MEQ tablet Take 20 mEq by mouth daily.    [provider]  rosuvastatin (CRESTOR) 10 MG tablet Take 10 mg by mouth daily.    [provider]   Allergies  Allergen Reactions  . Ace Inhibitors Swelling    FAMILY HISTORY:  History reviewed. No pertinent family history. SOCIAL HISTORY:  reports that she does not drink alcohol or use drugs. Her tobacco history is not on file.  REVIEW OF SYSTEMS:   Constitutional: Feels well. Cardiovascular: No chest pain.  Pulmonary: Denies dyspnea.   The remainder of  systems were reviewed and were found to be negative other than what is documented in the HPI.    VITAL SIGNS: Pulse Rate:  [74-86] 74 (08/26 1100) Resp:  [0-22] 17 (08/26 1100) BP: (121-136)/(66-75) 121/66 (08/26 1100) SpO2:  [95 %-98 %] 95 % (08/26 1100) HEMODYNAMICS:    Physical Examination:   VS: BP 121/66   Pulse 74   Resp 17   SpO2 95%   General Appearance: complaining of diffuse itching Neuro:without focal findings, mental  status, speech normal,. HEENT: patient with minimal tongue and lip swelling, widely patent posterior pharynx no stridor, no accessory muscle utilization Pulmonary: normal breath sounds., diaphragmatic excursion normal. CardiovascularNormal S1,S2.  No m/r/g.    Abdomen: Benign, Soft, non-tender, No masses, hepatosplenomegaly, No lymphadenopathy Endoc: No evident thyromegaly, no signs of acromegaly. Skin:   Warm, mild swelling, difficult to assess urticaria Extremities: normal, no cyanosis, clubbing, no edema, warm with normal capillary refill.    LABS: Reviewed   LABORATORY PANEL:   CBC  Recent Labs Lab 03/20/17 1059  WBC 10.2  HGB 14.2  HCT 41.5  PLT 371    Chemistries  No results for input(s): NA, K, CL, CO2, GLUCOSE, BUN, CREATININE, CALCIUM, MG, PHOS, AST, ALT, ALKPHOS, BILITOT in the last 168 hours.  Invalid input(s): GFRCGP  No results for input(s): GLUCAP in the last 168 hours. No results for input(s): PHART, PCO2ART, PO2ART in the last 168 hours. No results for input(s): AST, ALT, ALKPHOS, BILITOT, ALBUMIN in the last 168 hours.  Cardiac Enzymes No results for input(s): TROPONINI in the last 168 hours.  RADIOLOGY:  No results found.   Nancy Dellen, DO 03/20/2017, 11:31 AM

## 2017-03-20 NOTE — ED Notes (Addendum)
Pt began to cough and said "I cant breathe".  Pt put on non rebreather 15L due to resp distress.  Dr. Cinda Quest to bedside and This RN went to get an Epi-Pen 0.3mg  injection.  Epi-Pen was then injected into pts left thigh.  Solu-Medrol, Benadryl, and Pepcid were all then ordered per Dr. Cinda Quest.

## 2017-03-20 NOTE — ED Triage Notes (Addendum)
Patient arrives to [facility] ED with c/o facial swelling since Friday with the sensation of "my threat closing up". Patient is having difficulty speaking, however is managing secretions and airway at this time. Denies new medications or unusual foods

## 2017-03-20 NOTE — Plan of Care (Signed)
Problem: Education: Goal: Knowledge of [facility] Education information/materials will improve Outcome: Progressing Patient admitted for anaphylaxis reaction. Plan of care includes continuous cardiopulmonary monitoring, blood sugars checks, pain management and all other health conditions will be monitored while in the hospital.   Problem: Safety: Goal: Ability to remain free from injury will improve Outcome: Progressing Patient will be free from injury or fall during the shift. Bed in lowest position, call bell in reach.  Problem: Skin Integrity: Goal: Risk for impaired skin integrity will decrease Outcome: Progressing Skin will be free from blisters and hives during the shift.

## 2017-03-20 NOTE — Progress Notes (Signed)
Patient arrived on the unit via stretcher from the  ED. Patient AAOx3. NAD noted. IV to right FA intact. VS obtained, patient oriented to the room. Plan of care discussed with patient. Will continue to monitor.

## 2017-03-20 NOTE — ED Provider Notes (Signed)
[facility] Emergency Department Provider Note   ____________________________________________   First MD Initiated Contact with Patient 03/20/17 0940     (approximate)  I have reviewed the triage vital signs and the nursing notes.   HISTORY  Chief Complaint Facial Swelling    HPI Nancy Pham is a 51 y.o. female who developed hives on Friday. She also had some facial swelling and sensation with throat closing up. She took Benadryl got better but then when she woke up this morning she was swollen all over itchy all over her throat felt closing up again and she had some trouble with hoarseness. She had some pineapple drink Friday before this started but there are no new medications or any other unusual foods no new detergents etc.   Past Medical History:  Diagnosis Date  . Cancer ([place])   . Diabetes mellitus   . Gout   . Hyperlipidemia   . Hypertension     There are no active problems to display for this patient.   History reviewed. No pertinent surgical history.  Prior to Admission medications   Medication Sig Start Date End Date Taking? Authorizing Provider  allopurinol (ZYLOPRIM) 300 MG tablet Take 300 mg by mouth daily.    [provider]  atenolol (TENORMIN) 100 MG tablet Take 100 mg by mouth daily.    [provider]  chlorthalidone (HYGROTON) 25 MG tablet Take 25 mg by mouth daily.    [provider]  furosemide (LASIX) 40 MG tablet Take 40 mg by mouth daily.    [provider]  Liraglutide (VICTOZA) 18 MG/3ML SOLN Inject 1.8 mLs into the skin daily.    [provider]  OVER THE COUNTER MEDICATION Take 2 tablets by mouth daily. fiber    [provider]  potassium chloride SA (K-DUR,KLOR-CON) 20 MEQ tablet Take 20 mEq by mouth daily.    [provider]  rosuvastatin (CRESTOR) 10 MG tablet Take 10 mg by mouth daily.    [provider]  traMADol (ULTRAM) 50 MG tablet Take  50 mg by mouth daily.    [provider]    Allergies Ace inhibitors  History reviewed. No pertinent family history. Patient has a history of diabetes and mitral valve prolapse Social History Social History  Substance Use Topics  . Smoking status: Not on file  . Smokeless tobacco: Not on file  . Alcohol use No    Review of Systems  Constitutional: No fever/chills Eyes: No visual changes. ENT: No sore throat. Cardiovascular: Denies chest pain. Respiratory: Denies shortness of breath. Gastrointestinal: No abdominal pain.  No nausea, no vomiting.  No diarrhea.  No constipation. Genitourinary: Negative for dysuria. Musculoskeletal: Negative for back pain. Skin: Negative for rash. Neurological: Negative for headaches, focal weakness or numbness.  ____________________________________________   PHYSICAL EXAM:  VITAL SIGNS: ED Triage Vitals [03/20/17 0940]  Enc Vitals Group     BP      Pulse      Resp      Temp      Temp src      SpO2      Weight      Height      Head Circumference      Peak Flow      Pain Score 6     Pain Loc      Pain Edu?      Excl. in [place]?     Constitutional: Alert and oriented.. Eyes: Conjunctivae are normal  Head: Atraumatic. Nose: No congestion/rhinnorhea. Mouth/Throat: Mucous membranes are moist.  Oropharynx non-erythematous. Neck:  stridor.   Cardiovascular: Normal rate, regular rhythm. Grossly normal heart sounds.  Good peripheral circulation. Respiratory: Normal respiratory effort.  No retractions. Lungs referred stridorous sounds Gastrointestinal: Soft and nontender. No distention. No abdominal bruits. No CVA tenderness. Musculoskeletal: No lower extremity tenderness nor edema.  No joint effusions. Neurologic:  Normal speech and language. No gross focal neurologic deficits are appreciated Skin:  Skin is warm, dry and intact. Patient has diffuse blotchy red areas that are itchy Psychiatric: Mood and affect are normal. Speech  and behavior are normal.  ____________________________________________   LABS (all labs ordered are listed, but only abnormal results are displayed)  Labs Reviewed - No data to display ____________________________________________  EKG   ____________________________________________  RADIOLOGY   ____________________________________________   PROCEDURES  Procedure(s) performed:  Procedures  Critical Care performed:   ____________________________________________   INITIAL IMPRESSION / ASSESSMENT AND PLAN / ED COURSE  Pertinent labs & imaging results that were available during my care of the patient were reviewed by me and considered in my medical decision making (see chart for details).  Because of the patient's stridor and shortness of breath and facial swelling and swelling in her arms and legs I decided to give the patient IM epi. Patient got a shot and then started wheezing worse. Apparently she got very anxious we were able to calm her down and give her some oxygen and she got markedly better rapidly stridor disappeared .      ____________________________________________   FINAL CLINICAL IMPRESSION(S) / ED DIAGNOSES  Final diagnoses:  Angioedema, initial encounter  Allergic reaction, initial encounter  Stridor      NEW MEDICATIONS STARTED DURING THIS VISIT:  New Prescriptions   No medications on file     Note:  This document was prepared using Dragon voice recognition software and may include unintentional dictation errors.    Nena Polio, MD 03/20/17 1027

## 2017-03-20 NOTE — Progress Notes (Signed)
Pulmonary/critical care  Follow-up several hours later. Patient is doing quite well. Resting comfortably, states itching is significantly better, breathing without any difficulty. Chest x-ray and lateral neck views reviewed. 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 No additional Rex at this time. We'll be available if patient's status changes otherwise we'll sign off  Hermelinda Dellen, D.O.

## 2017-03-21 LAB — HEMOGLOBIN A1C
Hgb A1c MFr Bld: 6.1 % — ABNORMAL HIGH (ref 4.8–5.6)
Mean Plasma Glucose: 128.37 mg/dL

## 2017-03-21 LAB — GLUCOSE, CAPILLARY
GLUCOSE-CAPILLARY: 160 mg/dL — AB (ref 65–99)
Glucose-Capillary: 160 mg/dL — ABNORMAL HIGH (ref 65–99)

## 2017-03-21 LAB — C1 ESTERASE INHIBITOR: C1INH SerPl-mCnc: 38 mg/dL (ref 21–39)

## 2017-03-21 LAB — C4 COMPLEMENT: COMPLEMENT C4, BODY FLUID: 38 mg/dL (ref 14–44)

## 2017-03-21 MED ORDER — FUROSEMIDE 40 MG PO TABS: 40.0000 mg | ORAL_TABLET | Freq: Every day | ORAL | 1 refills | Status: DC

## 2017-03-21 MED ORDER — ASPIRIN EC 81 MG PO TBEC: 81.0000 mg | DELAYED_RELEASE_TABLET | Freq: Every day | ORAL | 2 refills | Status: DC

## 2017-03-21 MED ORDER — DIPHENHYDRAMINE HCL 25 MG PO CAPS: 25.0000 mg | ORAL_CAPSULE | Freq: Four times a day (QID) | ORAL | 0 refills | Status: DC | PRN

## 2017-03-21 MED ORDER — ATENOLOL 100 MG PO TABS: 100.0000 mg | ORAL_TABLET | Freq: Every day | ORAL | 1 refills | Status: DC

## 2017-03-21 MED ORDER — ALBUTEROL SULFATE HFA 108 (90 BASE) MCG/ACT IN AERS
1.0000 | INHALATION_SPRAY | Freq: Four times a day (QID) | RESPIRATORY_TRACT | Status: DC | PRN
  Filled 2017-03-21: qty 6.7

## 2017-03-21 MED ORDER — PREDNISONE 20 MG PO TABS: 40.0000 mg | ORAL_TABLET | Freq: Every day | ORAL | Status: DC

## 2017-03-21 MED ORDER — POTASSIUM CHLORIDE CRYS ER 20 MEQ PO TBCR: 20.0000 meq | EXTENDED_RELEASE_TABLET | Freq: Every day | ORAL | 0 refills | Status: DC

## 2017-03-21 MED ORDER — DIPHENHYDRAMINE HCL 25 MG PO CAPS
25.0000 mg | ORAL_CAPSULE | Freq: Four times a day (QID) | ORAL | Status: DC | PRN
Start: 2017-03-21 — End: 2017-03-21

## 2017-03-21 MED ORDER — EPINEPHRINE 0.3 MG/0.3ML IJ SOAJ: 0.3000 mg | INTRAMUSCULAR | 1 refills | Status: DC | PRN

## 2017-03-21 MED ORDER — LIRAGLUTIDE 18 MG/3ML ~~LOC~~ SOLN: 10.8000 mg | Freq: Every day | SUBCUTANEOUS | 0 refills | Status: DC

## 2017-03-21 MED ORDER — ALBUTEROL SULFATE HFA 108 (90 BASE) MCG/ACT IN AERS: 1.0000 | INHALATION_SPRAY | Freq: Four times a day (QID) | RESPIRATORY_TRACT | 2 refills | Status: DC | PRN

## 2017-03-21 MED ORDER — ROSUVASTATIN CALCIUM 10 MG PO TABS: 10.0000 mg | ORAL_TABLET | Freq: Every day | ORAL | 1 refills | Status: DC

## 2017-03-21 MED ORDER — FAMOTIDINE 20 MG PO TABS: 20.0000 mg | ORAL_TABLET | Freq: Two times a day (BID) | ORAL | 0 refills | Status: DC

## 2017-03-21 MED ORDER — CHLORTHALIDONE 25 MG PO TABS: 25.0000 mg | ORAL_TABLET | Freq: Every day | ORAL | 1 refills | Status: DC

## 2017-03-21 NOTE — Progress Notes (Addendum)
Discharge instructions reviewed with patient. Patient verbalized understanding of instructions, where to pick medications with application. IV removed, pressure dressing applied. NAD noted upon discharge.

## 2017-03-21 NOTE — Care Management Note (Addendum)
Case Management Note  Patient Details  Name: Nancy Pham MRN: [id number] Date of Birth: [birth year]  Subjective/Objective:                  RNCM met with patient to discuss discharge planning. Patient states she lost her insurance and disability in December. She states she lives with her sister and her niece pays for her cell phone. She states she walks independently. She states she has no source of income.  She agrees to referral to [facility] and Medication management which have been made. Good Rx card shared also with patient.    Action/Plan:   Expected Discharge Date:  03/21/17               Expected Discharge Plan:     In-House Referral:     Discharge planning Services  CM Consult, Medication Assistance, [facility]  Post Acute Care Choice:    Choice offered to:  Patient  DME Arranged:    DME Agency:     HH Arranged:    [place] Agency:     Status of Service:     If discussed at [employer], dates discussed:    Additional Comments:  Marshell Garfinkel, RN 03/21/2017, 10:38 AM

## 2017-03-21 NOTE — Care Management (Signed)
Patient's ride will not be here until after [facility] closes per patient. I have faxed Rx copies to [facility] for review as arrangements are being made to have medications picked up. Patient states her sister gets off at 3:30P [facility] closes at 47P). I encouraged patient to have her sister to come pick her up directly after work so that she can arrive to [facility] in time to receive her medications- she agrees. [facility] will call this RNCM back to let me know what medications they can assist with. Patient advised to take remaining medications to [facility] and use Good Rx card.  She shared that she would have difficulty paying for her medications. I encouraged her to have family assist her until she can get back on her feet with employment.

## 2017-03-21 NOTE — Discharge Instructions (Signed)
Heart healthy and ADA diet. Diet control and excercise

## 2017-03-21 NOTE — Discharge Summary (Signed)
[place] [facility] [place] NAME: Nancy Pham    MR#:  [id number]  DATE OF BIRTH:  10-15-1965  DATE OF ADMISSION:  03/20/2017   ADMITTING PHYSICIAN: Nicholes Mango, MD  DATE OF DISCHARGE: 03/21/2017  PRIMARY CARE PHYSICIAN: Patient, No Pcp Per   ADMISSION DIAGNOSIS:  Stridor [R06.1] Epiglottitis [J05.10] Dyspnea [R06.00] Allergic reaction, initial encounter [T78.40XA] Angioedema, initial encounter [T78.3XXA] Anaphylaxis [T78.2XXA] DISCHARGE DIAGNOSIS:  Active Problems:   Anaphylaxis  SECONDARY DIAGNOSIS:   Past Medical History:  Diagnosis Date  . Cancer ([place])   . Diabetes mellitus   . Gout   . Hyperlipidemia   . Hypertension    HOSPITAL COURSE:  Nancy Pham  is a 51 y.o. female with a known history of diabetes, hyperlipidemia,hypertension,asthma and obesity is presenting to the ED with a chief complaint of peptic reaction. Patient had a new brand of pineapple juice on Friday following which she noticed hives on her body   #anaphylactic reaction Patient has received EpiPen , Solu-Medrol, Pepcid and Benadryl in the ED She is treated with IV Solu-Medrol, Pepcid and Benadryl as needed Follow-up Urticaria with angioedema workup with tryptase level, C1 esterase level, C4 as outpatient. Albuterol nebulizer treatments as needed for shortness of breath Her symptoms has much improved. She has no stridor or dysphagia or wheezing. She only complains of itch sometimes.  #asthma exacerbation triggered by anaphylactic reaction Improved with Solu-Medrol, and Nebulizer treatments  #Insulin requiring diabetes mellitus Continue victoza n ISS Follow-up hemoglobin A1c his outpatient.  #Essential hypertension Continue home medications atenolol  #Hyperlipidemia-continue home med Crestor  Morbid obesity.  Discussed with case manager to give patient medication before discharge.  DISCHARGE CONDITIONS:  Stable, discharge to home  today. CONSULTS OBTAINED:   DRUG ALLERGIES:   Allergies  Allergen Reactions  . Ace Inhibitors Swelling   DISCHARGE MEDICATIONS:   Allergies as of 03/21/2017      Reactions   Ace Inhibitors Swelling      Medication List    STOP taking these medications   allopurinol 300 MG tablet Commonly known as:  ZYLOPRIM   ferrous sulfate 325 (65 FE) MG tablet   Potassium 99 MG Tabs   traMADol 50 MG tablet Commonly known as:  ULTRAM     TAKE these medications   albuterol 108 (90 Base) MCG/ACT inhaler Commonly known as:  PROVENTIL HFA;VENTOLIN HFA Inhale 1-2 puffs into the lungs every 6 (six) hours as needed for wheezing or shortness of breath.   aspirin EC 81 MG tablet Take 1 tablet (81 mg total) by mouth daily. What changed:  when to take this   atenolol 100 MG tablet Commonly known as:  TENORMIN Take 1 tablet (100 mg total) by mouth daily.   chlorthalidone 25 MG tablet Commonly known as:  HYGROTON Take 1 tablet (25 mg total) by mouth daily.   diphenhydrAMINE 25 mg capsule Commonly known as:  BENADRYL Take 1 capsule (25 mg total) by mouth every 6 (six) hours as needed for itching or allergies.   EPINEPHrine 0.3 mg/0.3 mL Soaj injection Commonly known as:  EPI-PEN Inject 0.3 mLs (0.3 mg total) into the muscle as needed (acute resp failure/worsening anaphylaxis).   famotidine 20 MG tablet Commonly known as:  PEPCID Take 1 tablet (20 mg total) by mouth 2 (two) times daily.   furosemide 40 MG tablet Commonly known as:  LASIX Take 1 tablet (40 mg total) by mouth daily.   Liraglutide 18 MG/3ML Soln injection Commonly  known as:  VICTOZA Inject 1.8 mLs (10.8 mg total) into the skin daily.   potassium chloride SA 20 MEQ tablet Commonly known as:  K-DUR,KLOR-CON Take 1 tablet (20 mEq total) by mouth daily.   rosuvastatin 10 MG tablet Commonly known as:  CRESTOR Take 1 tablet (10 mg total) by mouth daily.            Discharge Care Instructions        Start      Ordered   03/21/17 0000  Increase activity slowly     03/21/17 1006   03/21/17 0000  Diet - low sodium heart healthy     03/21/17 1006   03/21/17 0000  aspirin EC 81 MG tablet  Daily     03/21/17 1018   03/21/17 0000  atenolol (TENORMIN) 100 MG tablet  Daily     03/21/17 1018   03/21/17 0000  chlorthalidone (HYGROTON) 25 MG tablet  Daily     03/21/17 1018   03/21/17 0000  Liraglutide (VICTOZA) 18 MG/3ML SOLN injection  Daily     03/21/17 1018   03/21/17 0000  rosuvastatin (CRESTOR) 10 MG tablet  Daily     03/21/17 1018   03/21/17 0000  EPINEPHrine 0.3 mg/0.3 mL IJ SOAJ injection  As needed     03/21/17 1018   03/21/17 0000  famotidine (PEPCID) 20 MG tablet  2 times daily     03/21/17 1018   03/21/17 0000  furosemide (LASIX) 40 MG tablet  Daily     03/21/17 1018   03/21/17 0000  potassium chloride SA (K-DUR,KLOR-CON) 20 MEQ tablet  Daily     03/21/17 1018   03/21/17 0000  albuterol (PROVENTIL HFA;VENTOLIN HFA) 108 (90 Base) MCG/ACT inhaler  Every 6 hours PRN     03/21/17 1024   03/21/17 0000  diphenhydrAMINE (BENADRYL) 25 mg capsule  Every 6 hours PRN     03/21/17 1024       DISCHARGE INSTRUCTIONS:  See AVS.  If you experience worsening of your admission symptoms, develop shortness of breath, life threatening emergency, suicidal or homicidal thoughts you must seek medical attention immediately by calling 911 or calling your MD immediately  if symptoms less severe.  You Must read complete instructions/literature along with all the possible adverse reactions/side effects for all the Medicines you take and that have been prescribed to you. Take any new Medicines after you have completely understood and accpet all the possible adverse reactions/side effects.   Please note  You were cared for by a hospitalist during your hospital stay. If you have any questions about your discharge medications or the care you received while you were in the hospital after you are discharged, you  can call the unit and asked to speak with the hospitalist on call if the hospitalist that took care of you is not available. Once you are discharged, your primary care physician will handle any further medical issues. Please note that NO REFILLS for any discharge medications will be authorized once you are discharged, as it is imperative that you return to your primary care physician (or establish a relationship with a primary care physician if you do not have one) for your aftercare needs so that they can reassess your need for medications and monitor your lab values.    On the day of Discharge:  VITAL SIGNS:  Blood pressure 138/74, pulse 75, temperature 98.7 F (37.1 C), temperature source Oral, resp. rate 14, height 5\' 5"  (1.651 m), weight  290 lb 3.2 oz (131.6 kg), SpO2 98 %. PHYSICAL EXAMINATION:  GENERAL:  50 y.o.-year-old patient lying in the bed with no acute distress. Morbid obesity. EYES: Pupils equal, round, reactive to light and accommodation. No scleral icterus. Extraocular muscles intact.  HEENT: Head atraumatic, normocephalic. Oropharynx and nasopharynx clear.  NECK:  Supple, no jugular venous distention. No thyroid enlargement, no tenderness.  LUNGS: Normal breath sounds bilaterally, no wheezing, rales,rhonchi or crepitation. No use of accessory muscles of respiration.  CARDIOVASCULAR: S1, S2 normal. No murmurs, rubs, or gallops.  ABDOMEN: Soft, non-tender, non-distended. Bowel sounds present. No organomegaly or mass.  EXTREMITIES: No pedal edema, cyanosis, or clubbing.  NEUROLOGIC: Cranial nerves II through XII are intact. Muscle strength 5/5 in all extremities. Sensation intact. Gait not checked.  PSYCHIATRIC: The patient is alert and oriented x 3.  SKIN: No obvious rash, lesion, or ulcer.  DATA REVIEW:   CBC  Recent Labs Lab 03/20/17 1059  WBC 10.2  HGB 14.2  HCT 41.5  PLT 371    Chemistries   Recent Labs Lab 03/20/17 1059  NA 138  K 3.9  CL 106  CO2 24   GLUCOSE 137*  BUN 16  CREATININE 0.80  CALCIUM 8.7*     Microbiology Results  No results found for this or any previous visit.  RADIOLOGY:  No results found.   Management plans discussed with the patient, family and they are in agreement.  CODE STATUS: Full Code   TOTAL TIME TAKING CARE OF THIS PATIENT: 37 minutes.    Demetrios Loll M.D on 03/21/2017 at 2:15 PM  Between 7am to 6pm - Pager - [phone]  After 6pm go to [email or web address] - Proofreader  [facility]  Office  [phone]  CC: Primary care physician; Patient, No Pcp Per   Note: This dictation was prepared with Dragon dictation along with smaller phrase technology. Any transcriptional errors that result from this process are unintentional.

## 2017-03-21 NOTE — Care Management (Addendum)
Reported to this RNCM by unit director that staff has patient on high fall risk because she is unstable with gate/balance. Per staff PT evaluation is not needed. RNCM will arrange medication pick up if patient's ride is not available. Patient's sister in here to pick patient up now per patient/RN. Advised patient to allow sister to take application to [facility] and medication Rx to [facility] while patient waits in hospital in air conditioning- all agrees. [facility] aware that sister will pick up medications and agrees. They can assist will all meds except EpiPen and OTC medications. Patient/sister will need to take these to [facility].

## 2017-03-22 LAB — HIV ANTIBODY (ROUTINE TESTING W REFLEX): HIV Screen 4th Generation wRfx: NONREACTIVE

## 2017-03-23 LAB — TRYPTASE: Tryptase: 26.3 ug/L — ABNORMAL HIGH (ref 2.2–13.2)

## 2017-03-24 ENCOUNTER — Telehealth: Payer: Self-pay | Admitting: Pharmacy Technician

## 2017-03-24 NOTE — Telephone Encounter (Signed)
Patient is not living in [facility] or seeing a provider in [place].  [facility] will be unable to provide medication assistance unless patient is receiving care from an [facility] provider.  Provided patient with contact information for [place].  [facility]

## 2017-03-27 ENCOUNTER — Encounter: Payer: Self-pay | Admitting: Emergency Medicine

## 2017-03-27 ENCOUNTER — Emergency Department: Payer: Self-pay

## 2017-03-27 ENCOUNTER — Observation Stay
Admission: EM | Admit: 2017-03-27 | Discharge: 2017-03-28 | Disposition: A | Discharge status: Home / Self Care | Payer: Self-pay | Attending: Internal Medicine | Admitting: Internal Medicine

## 2017-03-27 DIAGNOSIS — M7989 Other specified soft tissue disorders: Secondary | ICD-10-CM | POA: Insufficient documentation

## 2017-03-27 DIAGNOSIS — I200 Unstable angina: Secondary | ICD-10-CM

## 2017-03-27 DIAGNOSIS — Z7982 Long term (current) use of aspirin: Secondary | ICD-10-CM | POA: Insufficient documentation

## 2017-03-27 DIAGNOSIS — R001 Bradycardia, unspecified: Secondary | ICD-10-CM | POA: Insufficient documentation

## 2017-03-27 DIAGNOSIS — E119 Type 2 diabetes mellitus without complications: Secondary | ICD-10-CM | POA: Insufficient documentation

## 2017-03-27 DIAGNOSIS — Z79899 Other long term (current) drug therapy: Secondary | ICD-10-CM | POA: Insufficient documentation

## 2017-03-27 DIAGNOSIS — E876 Hypokalemia: Secondary | ICD-10-CM | POA: Insufficient documentation

## 2017-03-27 DIAGNOSIS — R0602 Shortness of breath: Secondary | ICD-10-CM | POA: Diagnosis present

## 2017-03-27 DIAGNOSIS — E785 Hyperlipidemia, unspecified: Secondary | ICD-10-CM | POA: Insufficient documentation

## 2017-03-27 DIAGNOSIS — I10 Essential (primary) hypertension: Secondary | ICD-10-CM | POA: Insufficient documentation

## 2017-03-27 DIAGNOSIS — I951 Orthostatic hypotension: Principal | ICD-10-CM | POA: Insufficient documentation

## 2017-03-27 LAB — BASIC METABOLIC PANEL
ANION GAP: 12 (ref 5–15)
Anion gap: 15 (ref 5–15)
BUN: 17 mg/dL (ref 6–20)
BUN: 16 mg/dL (ref 6–20)
CALCIUM: 9.5 mg/dL (ref 8.9–10.3)
CALCIUM: 9.3 mg/dL (ref 8.9–10.3)
CO2: 34 mmol/L — AB (ref 22–32)
CO2: 32 mmol/L (ref 22–32)
CREATININE: 0.89 mg/dL (ref 0.44–1.00)
CREATININE: 0.9 mg/dL (ref 0.44–1.00)
Chloride: 92 mmol/L — ABNORMAL LOW (ref 101–111)
Chloride: 91 mmol/L — ABNORMAL LOW (ref 101–111)
GFR calc Af Amer: >60 mL/min (ref >60)
GFR calc non Af Amer: >60 mL/min (ref >60)
GLUCOSE: 174 mg/dL — AB (ref 65–99)
Glucose, Bld: 144 mg/dL — ABNORMAL HIGH (ref 65–99)
Potassium: 2.7 mmol/L — CL (ref 3.5–5.1)
Potassium: 3.4 mmol/L — ABNORMAL LOW (ref 3.5–5.1)
SODIUM: 138 mmol/L (ref 135–145)
SODIUM: 138 mmol/L (ref 135–145)

## 2017-03-27 LAB — CBC
HCT: 39.1 % (ref 35.0–47.0)
Hemoglobin: 13.5 g/dL (ref 12.0–16.0)
MCH: 29.1 pg (ref 26.0–34.0)
MCHC: 34.5 g/dL (ref 32.0–36.0)
MCV: 84.4 fL (ref 80.0–100.0)
Platelets: 338 K/uL (ref 150–440)
RBC: 4.63 MIL/uL (ref 3.80–5.20)
RDW: 14.2 % (ref 11.5–14.5)
WBC: 7.2 K/uL (ref 3.6–11.0)

## 2017-03-27 LAB — TROPONIN I
Troponin I: <0.03 ng/mL (ref <0.03)
Troponin I: <0.03 ng/mL (ref <0.03)

## 2017-03-27 LAB — HEMOGLOBIN A1C
HEMOGLOBIN A1C: 6.1 % — AB (ref 4.8–5.6)
Mean Plasma Glucose: 128.37 mg/dL

## 2017-03-27 LAB — GLUCOSE, CAPILLARY
Glucose-Capillary: 142 mg/dL — ABNORMAL HIGH (ref 65–99)
Glucose-Capillary: 141 mg/dL — ABNORMAL HIGH (ref 65–99)

## 2017-03-27 MED ORDER — ALBUTEROL SULFATE (2.5 MG/3ML) 0.083% IN NEBU: 2.5000 mg | INHALATION_SOLUTION | Freq: Four times a day (QID) | RESPIRATORY_TRACT | Status: DC | PRN

## 2017-03-27 MED ORDER — ALBUTEROL SULFATE (2.5 MG/3ML) 0.083% IN NEBU
5.0000 mg | INHALATION_SOLUTION | Freq: Once | RESPIRATORY_TRACT | Status: AC
  Administered 2017-03-27: 5 mg via RESPIRATORY_TRACT
  Filled 2017-03-27: qty 6

## 2017-03-27 MED ORDER — ROSUVASTATIN CALCIUM 10 MG PO TABS
10.0000 mg | ORAL_TABLET | Freq: Every day | ORAL | Status: DC
  Administered 2017-03-27 – 2017-03-28 (×2): 10 mg via ORAL
  Filled 2017-03-27 (×2): qty 1

## 2017-03-27 MED ORDER — VITAMIN D 1000 UNITS PO TABS
5000.0000 [IU] | ORAL_TABLET | Freq: Every day | ORAL | Status: DC
  Administered 2017-03-27 – 2017-03-28 (×2): 5000 [IU] via ORAL
  Filled 2017-03-27 (×2): qty 5

## 2017-03-27 MED ORDER — HEPARIN SODIUM (PORCINE) 5000 UNIT/ML IJ SOLN: 5000.0000 [IU] | Freq: Three times a day (TID) | INTRAMUSCULAR | Status: DC

## 2017-03-27 MED ORDER — HYDROCODONE-ACETAMINOPHEN 5-325 MG PO TABS: 1.0000 | ORAL_TABLET | ORAL | Status: DC | PRN

## 2017-03-27 MED ORDER — INSULIN ASPART 100 UNIT/ML ~~LOC~~ SOLN
0.0000 [IU] | Freq: Three times a day (TID) | SUBCUTANEOUS | Status: DC
  Administered 2017-03-27 – 2017-03-28 (×2): 3 [IU] via SUBCUTANEOUS
  Filled 2017-03-27 (×2): qty 1

## 2017-03-27 MED ORDER — BISACODYL 5 MG PO TBEC: 5.0000 mg | DELAYED_RELEASE_TABLET | Freq: Every day | ORAL | Status: DC | PRN

## 2017-03-27 MED ORDER — POTASSIUM CHLORIDE CRYS ER 20 MEQ PO TBCR
40.0000 meq | EXTENDED_RELEASE_TABLET | ORAL | Status: AC
  Administered 2017-03-27 (×2): 40 meq via ORAL
  Filled 2017-03-27 (×2): qty 2

## 2017-03-27 MED ORDER — ENOXAPARIN SODIUM 40 MG/0.4ML ~~LOC~~ SOLN
40.0000 mg | Freq: Two times a day (BID) | SUBCUTANEOUS | Status: DC
  Administered 2017-03-27 – 2017-03-28 (×2): 40 mg via SUBCUTANEOUS
  Filled 2017-03-27 (×2): qty 0.4

## 2017-03-27 MED ORDER — ASPIRIN EC 81 MG PO TBEC
81.0000 mg | DELAYED_RELEASE_TABLET | Freq: Every day | ORAL | Status: DC
  Administered 2017-03-28: 81 mg via ORAL
  Filled 2017-03-27: qty 1

## 2017-03-27 MED ORDER — ACETAMINOPHEN 650 MG RE SUPP: 650.0000 mg | Freq: Four times a day (QID) | RECTAL | Status: DC | PRN

## 2017-03-27 MED ORDER — ONDANSETRON HCL 4 MG/2ML IJ SOLN: 4.0000 mg | Freq: Four times a day (QID) | INTRAMUSCULAR | Status: DC | PRN

## 2017-03-27 MED ORDER — LIRAGLUTIDE 18 MG/3ML ~~LOC~~ SOLN: 10.8000 mg | Freq: Every day | SUBCUTANEOUS | Status: DC

## 2017-03-27 MED ORDER — INSULIN ASPART 100 UNIT/ML ~~LOC~~ SOLN: 0.0000 [IU] | Freq: Every day | SUBCUTANEOUS | Status: DC

## 2017-03-27 MED ORDER — POTASSIUM CHLORIDE 20 MEQ/15ML (10%) PO SOLN
40.0000 meq | Freq: Once | ORAL | Status: AC
  Administered 2017-03-27: 40 meq via ORAL
  Filled 2017-03-27: qty 30

## 2017-03-27 MED ORDER — ASPIRIN 81 MG PO CHEW
324.0000 mg | CHEWABLE_TABLET | Freq: Once | ORAL | Status: AC
  Administered 2017-03-27: 324 mg via ORAL
  Filled 2017-03-27: qty 4

## 2017-03-27 MED ORDER — ONDANSETRON HCL 4 MG PO TABS
4.0000 mg | ORAL_TABLET | Freq: Four times a day (QID) | ORAL | Status: DC | PRN
Start: 2017-03-27 — End: 2017-03-28

## 2017-03-27 MED ORDER — DOCUSATE SODIUM 100 MG PO CAPS
100.0000 mg | ORAL_CAPSULE | Freq: Two times a day (BID) | ORAL | Status: DC
  Administered 2017-03-27 – 2017-03-28 (×3): 100 mg via ORAL
  Filled 2017-03-27 (×3): qty 1

## 2017-03-27 MED ORDER — ATENOLOL 25 MG PO TABS
25.0000 mg | ORAL_TABLET | Freq: Two times a day (BID) | ORAL | Status: DC
  Administered 2017-03-28: 25 mg via ORAL
  Filled 2017-03-27 (×2): qty 1

## 2017-03-27 MED ORDER — FAMOTIDINE 20 MG PO TABS: 20.0000 mg | ORAL_TABLET | Freq: Two times a day (BID) | ORAL | Status: DC | PRN

## 2017-03-27 MED ORDER — TRAZODONE HCL 50 MG PO TABS: 25.0000 mg | ORAL_TABLET | Freq: Every evening | ORAL | Status: DC | PRN

## 2017-03-27 MED ORDER — ACETAMINOPHEN 325 MG PO TABS: 650.0000 mg | ORAL_TABLET | Freq: Four times a day (QID) | ORAL | Status: DC | PRN

## 2017-03-27 MED ORDER — CHLORTHALIDONE 25 MG PO TABS
25.0000 mg | ORAL_TABLET | Freq: Every day | ORAL | Status: DC
  Administered 2017-03-27: 25 mg via ORAL
  Filled 2017-03-27 (×2): qty 1

## 2017-03-27 MED ORDER — RISAQUAD PO CAPS
1.0000 | ORAL_CAPSULE | Freq: Every day | ORAL | Status: DC
  Administered 2017-03-27 – 2017-03-28 (×2): 1 via ORAL
  Filled 2017-03-27 (×2): qty 1

## 2017-03-27 MED ORDER — SODIUM CHLORIDE 0.9 % IV SOLN
INTRAVENOUS | Status: DC
  Administered 2017-03-27: 17:00:00 via INTRAVENOUS

## 2017-03-27 NOTE — ED Triage Notes (Signed)
[place] [facility] via [facility]. Patient reports she was walking through church and started having shortness of breath. Reports history of asthma but reports her at home is empty. Upon EMS arrival, patient was sitting with easy breathing. Patient denies CP, cough or fever. Patient able to ambulate to stretcher with no assistance. Speaking in complete sentences. A&O x4.

## 2017-03-27 NOTE — Progress Notes (Signed)
Anticoagulation monitoring(Lovenox):  51 yo  female ordered Lovenox 40 mg Q24h  Filed Weights   03/27/17 1133  Weight: 290 lb (131.5 kg)   BMI 48.3   Lab Results  Component Value Date   CREATININE 0.89 03/27/2017   CREATININE 0.80 03/20/2017   CREATININE 1.00 09/26/2011   Estimated Creatinine Clearance: 101.3 mL/min (by C-G formula based on SCr of 0.89 mg/dL). Hemoglobin & Hematocrit     Component Value Date/Time   HGB 13.5 03/27/2017 1135   HCT 39.1 03/27/2017 1135     Per Protocol for Patient with estCrcl > 30 ml/min and BMI > 40, will transition to Lovenox 40 mg Q12h.

## 2017-03-27 NOTE — H&P (Signed)
[place] [facility] [place] NAME: Nancy Pham    MR#:  [id number]  DATE OF BIRTH:  May 04, 1966  DATE OF ADMISSION:  03/27/2017  PRIMARY CARE PHYSICIAN: Patient, No Pcp Per   REQUESTING/REFERRING PHYSICIAN: Dr. Conni Slipper  CHIEF COMPLAINT: Shortness of breath    Chief Complaint  Patient presents with  . Shortness of Breath    HISTORY OF PRESENT ILLNESS:  Nancy Pham  is a 51 y.o. female with a known history of Essential hypertension, diabetes mellitus, hyperlipidemia comes in because of shortness of breath of sudden onset this afternoon associated with dizziness, chest tightness. She went to sit down and after 10-15 minutes chest tightness went away and also shortness of breath resolved. Patient denies any compliance at this time. Patient EKG showed down sloping of ST segment in V4, V5, V6. She  has no PCP. recentlly was admitted recently on August 26 and discharged on August 27 that time she is admitted for anaphylaxis.  PAST MEDICAL HISTORY:   Past Medical History:  Diagnosis Date  . Cancer ([place])   . Diabetes mellitus   . Gout   . Hyperlipidemia   . Hypertension     PAST SURGICAL HISTOIRY:  History reviewed. No pertinent surgical history.  SOCIAL HISTORY:   Social History  Substance Use Topics  . Smoking status: Never Smoker  . Smokeless tobacco: Never Used  . Alcohol use No    FAMILY HISTORY:  No family history on file.  DRUG ALLERGIES:   Allergies  Allergen Reactions  . Ace Inhibitors Swelling    REVIEW OF SYSTEMS:  CONSTITUTIONAL: No fever, fatigue or weakness.  EYES: No blurred or double vision.  EARS, NOSE, AND THROAT: No tinnitus or ear pain.  RESPIRATORY: No cough, shortness of breath, wheezing or hemoptysis.  CARDIOVASCULAR: No chest pain, orthopnea, edema.  GASTROINTESTINAL: No nausea, vomiting, diarrhea or abdominal pain.  GENITOURINARY: No dysuria, hematuria.  ENDOCRINE: No polyuria, nocturia,   HEMATOLOGY: No anemia, easy bruising or bleeding SKIN: No rash or lesion. MUSCULOSKELETAL: No joint pain or arthritis.   NEUROLOGIC: No tingling, numbness, weakness.  PSYCHIATRY: No anxiety or depression.   MEDICATIONS AT HOME:   Prior to Admission medications   Medication Sig Start Date End Date Taking? Authorizing Provider  albuterol (PROVENTIL HFA;VENTOLIN HFA) 108 (90 Base) MCG/ACT inhaler Inhale 1-2 puffs into the lungs every 6 (six) hours as needed for wheezing or shortness of breath. 03/21/17  Yes Demetrios Loll, MD  aspirin EC 81 MG tablet Take 1 tablet (81 mg total) by mouth daily. 03/21/17  Yes Demetrios Loll, MD  atenolol (TENORMIN) 100 MG tablet Take 1 tablet (100 mg total) by mouth daily. 03/21/17  Yes Demetrios Loll, MD  chlorthalidone (HYGROTON) 25 MG tablet Take 1 tablet (25 mg total) by mouth daily. 03/21/17  Yes Demetrios Loll, MD  Cholecalciferol 5000 units capsule Take 5,000 Units by mouth daily.   Yes [provider]  diphenhydrAMINE (BENADRYL) 25 mg capsule Take 1 capsule (25 mg total) by mouth every 6 (six) hours as needed for itching or allergies. 03/21/17  Yes Demetrios Loll, MD  famotidine (PEPCID) 20 MG tablet Take 1 tablet (20 mg total) by mouth 2 (two) times daily. 03/21/17  Yes Demetrios Loll, MD  furosemide (LASIX) 40 MG tablet Take 1 tablet (40 mg total) by mouth daily. 03/21/17  Yes Demetrios Loll, MD  Liraglutide (VICTOZA) 18 MG/3ML SOLN injection Inject 1.8 mLs (10.8 mg total) into the skin daily.  03/21/17  Yes Demetrios Loll, MD  potassium chloride SA (K-DUR,KLOR-CON) 20 MEQ tablet Take 1 tablet (20 mEq total) by mouth daily. 03/21/17  Yes Demetrios Loll, MD  Probiotic Product (PROBIOTIC DAILY PO) Take 1 capsule by mouth daily.   Yes [provider]  rosuvastatin (CRESTOR) 10 MG tablet Take 1 tablet (10 mg total) by mouth daily. 03/21/17  Yes Demetrios Loll, MD  EPINEPHrine 0.3 mg/0.3 mL IJ SOAJ injection Inject 0.3 mLs (0.3 mg total) into the muscle as needed (acute resp  failure/worsening anaphylaxis). Patient not taking: Reported on 03/27/2017 03/21/17   Demetrios Loll, MD      VITAL SIGNS:  Blood pressure 129/71, pulse (!) 59, temperature 98.4 F (36.9 C), temperature source Oral, resp. rate (!) 22, height 5\' 5"  (1.651 m), weight 131.5 kg (290 lb), SpO2 99 %.  PHYSICAL EXAMINATION:  GENERAL:  51 y.o.-year-old patient lying in the bed with no acute distress.  EYES: Pupils equal, round, reactive to light and accommodation. No scleral icterus. Extraocular muscles intact.  HEENT: Head atraumatic, normocephalic. Oropharynx and nasopharynx clear.  NECK:  Supple, no jugular venous distention. No thyroid enlargement, no tenderness.  LUNGS: Normal breath sounds bilaterally, no wheezing, rales,rhonchi or crepitation. No use of accessory muscles of respiration.  CARDIOVASCULAR: S1, S2 normal. No murmurs, rubs, or gallops.  ABDOMEN: Soft, nontender, nondistended. Bowel sounds present. No organomegaly or mass.  EXTREMITIES: No pedal edema, cyanosis, or clubbing.  NEUROLOGIC: Cranial nerves II through XII are intact. Muscle strength 5/5 in all extremities. Sensation intact. Gait not checked.  PSYCHIATRIC: The patient is alert and oriented x 3.  SKIN: No obvious rash, lesion, or ulcer.   LABORATORY PANEL:   CBC  Recent Labs Lab 03/27/17 1135  WBC 7.2  HGB 13.5  HCT 39.1  PLT 338   ------------------------------------------------------------------------------------------------------------------  Chemistries   Recent Labs Lab 03/27/17 1135  NA 138  K 2.7*  CL 92*  CO2 34*  GLUCOSE 144*  BUN 17  CREATININE 0.89  CALCIUM 9.5   ------------------------------------------------------------------------------------------------------------------  Cardiac Enzymes  Recent Labs Lab 03/27/17 1340  TROPONINI <0.03   ------------------------------------------------------------------------------------------------------------------  RADIOLOGY:  Dg Chest 2  View  Result Date: 03/27/2017 CLINICAL DATA:  New onset dizziness today. EXAM: CHEST  2 VIEW COMPARISON:  One-view chest x-ray 03/20/2017 FINDINGS: The heart size is normal. Lungs are clear. There is no edema or effusion. No focal airspace disease is present. Mild curvature in the upper thoracic spine is again noted. Cholecystectomy is present. IMPRESSION: Negative two view chest x-ray Electronically Signed   By: San Morelle M.D.   On: 03/27/2017 12:12    EKG:   Orders placed or performed during the hospital encounter of 03/27/17  . ED EKG  . ED EKG  . EKG 12-Lead  . EKG 12-Lead  . Repeat EKG  . Repeat EKG  Patient's EKG showed sinus bradycardia with 58 bpm with downsloping of ST segment in V4, V5, V6 these changes are new compared to 2013 EKG. Patient was admitted recently on August 26 but that  timer there is no EKG. In computer.  IMPRESSION AND PLAN:   #75/.51 year old obese female with history of diabetes, essential hypertension had sudden onset of dizziness, shortness of breath, chest tightness: Patient has no PCP and did not see any PCP since December of last year. Patient troponins are negative or 2 times but because of her symptoms admit her to telemetry, get echocardiogram, continue aspirin, low-dose beta blockers, statins, nitrates, further plan based on echocardiogram.  #  2 .hypokalemia: The patient's potassium 2.7. Dizziness probably also secondary to hypokalemia. Replace the potassium. She received 40 MG Q IV potassium in the emergency room. Also ordered oral potassium supplements, recheck potassium in the evening. Hold diuretics. #3. bradycardia: Decreased the dose of atenolol. dizziness probably secondary to bradycardia also. #5 .diabetes mellitus type 2: Check hemoglobin A1c, continue Victoza, sliding coverage. #6 .hyperlipidemia: Continue statins, check fasting lipids.   All the records are reviewed and case discussed with ED provider. Management plans discussed with  the patient, family and they are in agreement.  CODE STATUS: full  TOTAL TIME TAKING CARE OF THIS PATIENT: 9minutes.    Epifanio Lesches M.D on 03/27/2017 at 3:32 PM  Between 7am to 6pm - Pager - [phone]  After 6pm go to [email or web address] - password [email or web address] [facility]  Office  [phone]  CC: Primary care physician; Patient, No Pcp Per  Note: This dictation was prepared with Dragon dictation along with smaller phrase technology. Any transcriptional errors that result from this process are unintentional.

## 2017-03-27 NOTE — Progress Notes (Signed)
Patient admitted from ED with SOB and abnormal EKG.  Alert awake and in no distress. Lungs clear, no dyspnea with rest or exertion.  Telemetry shows NSR.

## 2017-03-27 NOTE — ED Provider Notes (Signed)
[facility] Emergency Department Provider Note   ____________________________________________   First MD Initiated Contact with Patient 03/27/17 1148     (approximate)  I have reviewed the triage vital signs and the nursing notes.   HISTORY  Chief Complaint Shortness of Breath    HPI Nancy Pham is a 51 y.o. female Who reports she was walking at church and then stood for a minute or 2 at the door and then got short of breath and had chest tightness and felt like she would pass out. She went to sit down and after 10 or 15 minutes the tightness went away. Patient has not had this kind of this  Before. She does have asthma but did not have wheezing.she feels well now   Past Medical History:  Diagnosis Date  . Cancer ([place])   . Diabetes mellitus   . Gout   . Hyperlipidemia   . Hypertension     Patient Active Problem List   Diagnosis Date Noted  . Anaphylaxis 03/20/2017    History reviewed. No pertinent surgical history.  Prior to Admission medications   Medication Sig Start Date End Date Taking? Authorizing Provider  albuterol (PROVENTIL HFA;VENTOLIN HFA) 108 (90 Base) MCG/ACT inhaler Inhale 1-2 puffs into the lungs every 6 (six) hours as needed for wheezing or shortness of breath. 03/21/17  Yes Demetrios Loll, MD  aspirin EC 81 MG tablet Take 1 tablet (81 mg total) by mouth daily. 03/21/17  Yes Demetrios Loll, MD  atenolol (TENORMIN) 100 MG tablet Take 1 tablet (100 mg total) by mouth daily. 03/21/17  Yes Demetrios Loll, MD  chlorthalidone (HYGROTON) 25 MG tablet Take 1 tablet (25 mg total) by mouth daily. 03/21/17  Yes Demetrios Loll, MD  Cholecalciferol 5000 units capsule Take 5,000 Units by mouth daily.   Yes [provider]  diphenhydrAMINE (BENADRYL) 25 mg capsule Take 1 capsule (25 mg total) by mouth every 6 (six) hours as needed for itching or allergies. 03/21/17  Yes Demetrios Loll, MD  famotidine (PEPCID) 20 MG tablet Take 1 tablet (20 mg total) by mouth  2 (two) times daily. 03/21/17  Yes Demetrios Loll, MD  furosemide (LASIX) 40 MG tablet Take 1 tablet (40 mg total) by mouth daily. 03/21/17  Yes Demetrios Loll, MD  Liraglutide (VICTOZA) 18 MG/3ML SOLN injection Inject 1.8 mLs (10.8 mg total) into the skin daily. 03/21/17  Yes Demetrios Loll, MD  potassium chloride SA (K-DUR,KLOR-CON) 20 MEQ tablet Take 1 tablet (20 mEq total) by mouth daily. 03/21/17  Yes Demetrios Loll, MD  Probiotic Product (PROBIOTIC DAILY PO) Take 1 capsule by mouth daily.   Yes [provider]  rosuvastatin (CRESTOR) 10 MG tablet Take 1 tablet (10 mg total) by mouth daily. 03/21/17  Yes Demetrios Loll, MD  EPINEPHrine 0.3 mg/0.3 mL IJ SOAJ injection Inject 0.3 mLs (0.3 mg total) into the muscle as needed (acute resp failure/worsening anaphylaxis). Patient not taking: Reported on 03/27/2017 03/21/17   Demetrios Loll, MD    Allergies Ace inhibitors  No family history on file.  Social History Social History  Substance Use Topics  . Smoking status: Never Smoker  . Smokeless tobacco: Never Used  . Alcohol use No    Review of Systems  Constitutional: No fever/chills Eyes: No visual changes. ENT: No sore throat. Cardiovascular: Denies chest pain at present. Respiratory: Denies shortness of breath at present. Gastrointestinal: No abdominal pain.  No nausea, no vomiting.  No diarrhea.  No constipation. Genitourinary: Negative for dysuria.  Musculoskeletal: Negative for back pain. Skin: Negative for rash. Neurological: Negative for headaches, focal weakness  ____________________________________________   PHYSICAL EXAM:  VITAL SIGNS: ED Triage Vitals  Enc Vitals Group     BP 03/27/17 1132 (!) 125/97     Pulse Rate 03/27/17 1132 62     Resp 03/27/17 1132 16     Temp 03/27/17 1132 98.4 F (36.9 C)     Temp Source 03/27/17 1132 Oral     SpO2 03/27/17 1132 97 %     Weight 03/27/17 1133 290 lb (131.5 kg)     Height 03/27/17 1133 5\' 5"  (1.651 m)     Head Circumference --      Peak  Flow --      Pain Score --      Pain Loc --      Pain Edu? --      Excl. in [place]? --     Constitutional: Alert and oriented. Well appearing and in no acute distress. Eyes: Conjunctivae are normal.  Head: Atraumatic. Nose: No congestion/rhinnorhea. Mouth/Throat: Mucous membranes are moist.  Oropharynx non-erythematous. Neck: No stridor.   Cardiovascular: Normal rate, regular rhythm. Grossly normal heart sounds.  Good peripheral circulation. Respiratory: Normal respiratory effort.  No retractions. Lungs CTAB. Gastrointestinal: Soft and nontender. No distention. No abdominal bruits. No CVA tenderness. Musculoskeletal: No lower extremity tenderness trace edema.  No joint effusions. Neurologic:  Normal speech and language. No gross focal neurologic deficits are appreciated. Skin:  Skin is warm, dry and intact. No rash noted. Psychiatric: Mood and affect are normal. Speech and behavior are normal.  ____________________________________________   LABS (all labs ordered are listed, but only abnormal results are displayed)  Labs Reviewed  BASIC METABOLIC PANEL - Abnormal; Notable for the following:       Result Value   Potassium 2.7 (*)    Chloride 92 (*)    CO2 34 (*)    Glucose, Bld 144 (*)    All other components within normal limits  CBC  TROPONIN I  TROPONIN I   ____________________________________________  EKG  EKG read and interpreted by meshows sinus bradycardia at 58 normal axis flipped T's in 3 and F From 2013 also downsloping ST segments in V4 5 and 6 also new from 2013 _EKG #2 read and interpreted by me shows normal sinus rhythm at 62 normal axis no real change from #1___________________________________________  RADIOLOGY  IMPRESSION: Negative two view chest x-ray   Electronically Signed   By: San Morelle M.D.   On: 03/27/2017 12:12  ____________________________________________   PROCEDURES  Procedure(s) performed:   Procedures  Critical  Care performed:   ____________________________________________   INITIAL IMPRESSION / ASSESSMENT AND PLAN / ED COURSE  Pertinent labs & imaging results that were available during my care of the patient were reviewed by me and considered in my medical decision making (see chart for details).        ____________________________________________   FINAL CLINICAL IMPRESSION(S) / ED DIAGNOSES  Final diagnoses:  Unstable angina pectoris ([place])   Actual diagnosis is chest pain possible new onset angina   NEW MEDICATIONS STARTED DURING THIS VISIT:  New Prescriptions   No medications on file     Note:  This document was prepared using Dragon voice recognition software and may include unintentional dictation errors.    Nena Polio, MD 03/27/17 [phone]

## 2017-03-28 ENCOUNTER — Observation Stay
Admit: 2017-03-28 | Discharge: 2017-03-28 | Disposition: A | Discharge status: Home / Self Care | Payer: Self-pay | Attending: Internal Medicine | Admitting: Internal Medicine

## 2017-03-28 LAB — BASIC METABOLIC PANEL
Anion gap: 9 (ref 5–15)
BUN: 14 mg/dL (ref 6–20)
CHLORIDE: 97 mmol/L — AB (ref 101–111)
CO2: 31 mmol/L (ref 22–32)
Calcium: 8.6 mg/dL — ABNORMAL LOW (ref 8.9–10.3)
Creatinine, Ser: 0.69 mg/dL (ref 0.44–1.00)
GFR calc non Af Amer: >60 mL/min (ref >60)
Glucose, Bld: 126 mg/dL — ABNORMAL HIGH (ref 65–99)
POTASSIUM: 3.1 mmol/L — AB (ref 3.5–5.1)
SODIUM: 137 mmol/L (ref 135–145)

## 2017-03-28 LAB — CBC
HEMATOCRIT: 36.7 % (ref 35.0–47.0)
HEMOGLOBIN: 12.2 g/dL (ref 12.0–16.0)
MCH: 28.8 pg (ref 26.0–34.0)
MCHC: 33.3 g/dL (ref 32.0–36.0)
MCV: 86.5 fL (ref 80.0–100.0)
Platelets: 279 10*3/uL (ref 150–440)
RBC: 4.24 MIL/uL (ref 3.80–5.20)
RDW: 14.2 % (ref 11.5–14.5)
WBC: 7.7 10*3/uL (ref 3.6–11.0)

## 2017-03-28 LAB — LIPID PANEL
CHOL/HDL RATIO: 4.1 ratio
Cholesterol: 127 mg/dL (ref 0–200)
HDL: 31 mg/dL — AB (ref >40)
LDL CALC: 44 mg/dL (ref 0–99)
Triglycerides: 258 mg/dL — ABNORMAL HIGH (ref <150)
VLDL: 52 mg/dL — AB (ref 0–40)

## 2017-03-28 LAB — GLUCOSE, CAPILLARY
GLUCOSE-CAPILLARY: 120 mg/dL — AB (ref 65–99)
GLUCOSE-CAPILLARY: 149 mg/dL — AB (ref 65–99)

## 2017-03-28 MED ORDER — SODIUM CHLORIDE 0.9 % IV BOLUS (SEPSIS)
500.0000 mL | Freq: Once | INTRAVENOUS | Status: AC
  Administered 2017-03-28: 500 mL via INTRAVENOUS

## 2017-03-28 MED ORDER — ATENOLOL 25 MG PO TABS
25.0000 mg | ORAL_TABLET | Freq: Every day | ORAL | Status: DC
  Filled 2017-03-28: qty 1

## 2017-03-28 MED ORDER — POTASSIUM CHLORIDE CRYS ER 20 MEQ PO TBCR
40.0000 meq | EXTENDED_RELEASE_TABLET | Freq: Every day | ORAL | Status: DC
  Administered 2017-03-28: 40 meq via ORAL
  Filled 2017-03-28: qty 2

## 2017-03-28 MED ORDER — ATENOLOL 25 MG PO TABS: 25.0000 mg | ORAL_TABLET | Freq: Every day | ORAL | 0 refills | Status: DC

## 2017-03-28 MED ORDER — MAGNESIUM SULFATE 2 GM/50ML IV SOLN
2.0000 g | Freq: Once | INTRAVENOUS | Status: AC
  Administered 2017-03-28: 2 g via INTRAVENOUS
  Filled 2017-03-28: qty 50

## 2017-03-28 MED ORDER — POTASSIUM CHLORIDE CRYS ER 20 MEQ PO TBCR
40.0000 meq | EXTENDED_RELEASE_TABLET | Freq: Once | ORAL | Status: AC
  Administered 2017-03-28: 40 meq via ORAL
  Filled 2017-03-28: qty 2

## 2017-03-28 NOTE — Care Management (Signed)
Patient says that she has completed an application for [facility] and has a pending appointment next week.  CM attempted to confirm because patient resides in [facility] but  unable to confirm due to the labor day holiday.    Discussed concern with patient that [place] residents are not usually accepted by [facility] clinic. Provided contact information for [facility].  Unable to contact [facility] due to the holiday.  patient is not discharging home on any new medications

## 2017-03-28 NOTE — Progress Notes (Signed)
Patient is discharge home in a stable condition, summary and f/u care given, verbalized understanding . 

## 2017-03-28 NOTE — Progress Notes (Signed)
Potassium level 3.1. MD Pyreddy made aware. One time dose 40 mEq PO ordered.

## 2017-03-28 NOTE — Discharge Summary (Signed)
[facility] NAME: Nancy Pham    MR#:  [id number]  DATE OF BIRTH:  07/12/1966  DATE OF ADMISSION:  03/27/2017 ADMITTING PHYSICIAN: Epifanio Lesches, MD  DATE OF DISCHARGE: 03/28/2017  PRIMARY CARE PHYSIC[facility]    ADMISSION DIAGNOSIS:  Unstable angina pectoris ([place]) [I20.0]  DISCHARGE DIAGNOSIS:  Active Problems:   SOB (shortness of breath)   SECONDARY DIAGNOSIS:   Past Medical History:  Diagnosis Date  . Cancer ([place])   . Diabetes mellitus   . Gout   . Hyperlipidemia   . Hypertension     HOSPITAL COURSE:   1. Orthostatic hypotension. The patient was given a fluid bolus and this has resolved the orthostatic hypotension. The patient stated she recently started all of her medications one week ago. I think that the dosages of these medications this probably too much for her. Discontinue Hygroton. The patient wants to continue on the Lasix because of swelling of the lower extremities. I will decrease the dose of atenolol. 2. Hypokalemia likely secondary to hygroton. This medication was discontinued. magnesium and potassium replaced. 3. Bradycardia. Decrease dose of atenolol. 4. Type 2 diabetes mellitus. Last hemoglobin A1c 6.1. On Victoza 5. Hyperlipidemia  on statin. 6. The patient had some shortness of breath when she came in. Chest  X-ray negative. No further symptoms. Wondering if this could be an anxiety attack. Cardiac enzymes negative. 7. Recent allergic reaction. Refer to allergist as outpatient.  Echocardiogram pending  DISCHARGE CONDITIONS:   satisfactory  CONSULTS OBTAINED:  none  DRUG ALLERGIES:   Allergies  Allergen Reactions  . Ace Inhibitors Swelling    DISCHARGE MEDICATIONS:   Current Discharge Medication List    CONTINUE these medications which have CHANGED   Details  atenolol (TENORMIN) 25 MG tablet Take 1 tablet (25 mg total) by mouth at bedtime. Qty: 30 tablet, Refills:  0      CONTINUE these medications which have NOT CHANGED   Details  albuterol (PROVENTIL HFA;VENTOLIN HFA) 108 (90 Base) MCG/ACT inhaler Inhale 1-2 puffs into the lungs every 6 (six) hours as needed for wheezing or shortness of breath. Qty: 8 g, Refills: 2    aspirin EC 81 MG tablet Take 1 tablet (81 mg total) by mouth daily. Qty: 30 tablet, Refills: 2    Cholecalciferol 5000 units capsule Take 5,000 Units by mouth daily.    famotidine (PEPCID) 20 MG tablet Take 1 tablet (20 mg total) by mouth 2 (two) times daily. Qty: 14 tablet, Refills: 0    furosemide (LASIX) 40 MG tablet Take 1 tablet (40 mg total) by mouth daily. Qty: 30 tablet, Refills: 1    Liraglutide (VICTOZA) 18 MG/3ML SOLN injection Inject 1.8 mLs (10.8 mg total) into the skin daily. Qty: 50 mL, Refills: 0    potassium chloride SA (K-DUR,KLOR-CON) 20 MEQ tablet Take 1 tablet (20 mEq total) by mouth daily. Qty: 30 tablet, Refills: 0    Probiotic Product (PROBIOTIC DAILY PO) Take 1 capsule by mouth daily.    rosuvastatin (CRESTOR) 10 MG tablet Take 1 tablet (10 mg total) by mouth daily. Qty: 30 tablet, Refills: 1    EPINEPHrine 0.3 mg/0.3 mL IJ SOAJ injection Inject 0.3 mLs (0.3 mg total) into the muscle as needed (acute resp failure/worsening anaphylaxis). Qty: 3 Device, Refills: 1      STOP taking these medications     chlorthalidone (HYGROTON) 25 MG tablet      diphenhydrAMINE (BENADRYL) 25 mg  capsule          DISCHARGE INSTRUCTIONS:   [facility] Follow-up allergist as outpatient  If you experience worsening of your admission symptoms, develop shortness of breath, life threatening emergency, suicidal or homicidal thoughts you must seek medical attention immediately by calling 911 or calling your MD immediately  if symptoms less severe.  You Must read complete instructions/literature along with all the possible adverse reactions/side effects for all the Medicines you take and  that have been prescribed to you. Take any new Medicines after you have completely understood and accept all the possible adverse reactions/side effects.   Please note  You were cared for by a hospitalist during your hospital stay. If you have any questions about your discharge medications or the care you received while you were in the hospital after you are discharged, you can call the unit and asked to speak with the hospitalist on call if the hospitalist that took care of you is not available. Once you are discharged, your primary care physician will handle any further medical issues. Please note that NO REFILLS for any discharge medications will be authorized once you are discharged, as it is imperative that you return to your primary care physician (or establish a relationship with a primary care physician if you do not have one) for your aftercare needs so that they can reassess your need for medications and monitor your lab values.    Today   CHIEF COMPLAINT:   Chief Complaint  Patient presents with  . Shortness of Breath    HISTORY OF PRESENT ILLNESS:  Nancy Pham  is a 51 y.o. female resented with shortness of breath   VITAL SIGNS:  Blood pressure 131/84, pulse 80, temperature 98.4 F (36.9 C), temperature source Oral, resp. rate 20, height 5\' 5"  (1.651 m), weight 129.7 kg (286 lb), SpO2 97 %.   PHYSICAL EXAMINATION:  GENERAL:  51 y.o.-year-old patient lying in the bed with no acute distress.  EYES: Pupils equal, round, reactive to light and accommodation. No scleral icterus. Extraocular muscles intact.  HEENT: Head atraumatic, normocephalic. Oropharynx and nasopharynx clear.  NECK:  Supple, no jugular venous distention. No thyroid enlargement, no tenderness.  LUNGS: Normal breath sounds bilaterally, no wheezing, rales,rhonchi or crepitation. No use of accessory muscles of respiration.  CARDIOVASCULAR: S1, S2 normal. No murmurs, rubs, or gallops.  ABDOMEN: Soft,  non-tender, non-distended. Bowel sounds present. No organomegaly or mass.  EXTREMITIES: 3+edema, nocyanosis, or clubbing.  NEUROLOGIC: Cranial nerves II through XII are intact. Muscle strength 5/5 in all extremities. Sensation intact. Gait not checked.  PSYCHIATRIC: The patient is alert and oriented x 3.  SKIN: No obvious rash, lesion, or ulcer.   DATA REVIEW:   CBC  Recent Labs Lab 03/28/17 0434  WBC 7.7  HGB 12.2  HCT 36.7  PLT 279    Chemistries   Recent Labs Lab 03/28/17 0434  NA 137  K 3.1*  CL 97*  CO2 31  GLUCOSE 126*  BUN 14  CREATININE 0.69  CALCIUM 8.6*    Cardiac Enzymes  Recent Labs Lab 03/27/17 1340  TROPONINI <0.03      RADIOLOGY:  Dg Chest 2 View  Result Date: 03/27/2017 CLINICAL DATA:  New onset dizziness today. EXAM: CHEST  2 VIEW COMPARISON:  One-view chest x-ray 03/20/2017 FINDINGS: The heart size is normal. Lungs are clear. There is no edema or effusion. No focal airspace disease is present. Mild curvature in the  upper thoracic spine is again noted. Cholecystectomy is present. IMPRESSION: Negative two view chest x-ray Electronically Signed   By: San Morelle M.D.   On: 03/27/2017 12:12      Management plans discussed with the patient, and she is in agreement.  CODE STATUS:     Code Status Orders        Start     Ordered   03/27/17 1518  Full code  Continuous     03/27/17 1519    Code Status History    Date Active Date Inactive Code Status Order ID Comments User Context   03/20/2017  1:25 PM 03/21/2017  9:16 PM Full Code [id number]  Nicholes Mango, MD Inpatient      TOTAL TIME TAKING CARE OF THIS PATIENT: 35 minutes.    Loletha Grayer M.D on 03/28/2017 at 2:33 PM  Between 7am to 6pm - Pager - [phone]  After 6pm go to [email or web address] - Proofreader  [facility] Office  [phone]  CC: Primary care physician; [facility]

## 2017-03-29 LAB — ECHOCARDIOGRAM COMPLETE
Height: 65 in
WEIGHTICAEL: 4576 [oz_av]

## 2017-04-04 ENCOUNTER — Ambulatory Visit: Payer: Self-pay | Admitting: Pharmacy Technician

## 2017-04-04 DIAGNOSIS — Z79899 Other long term (current) drug therapy: Secondary | ICD-10-CM

## 2017-04-04 NOTE — Progress Notes (Signed)
Met with patient completed financial assistance application for [place] due to recent hospital visit.  Patient agreed to be responsible for gathering financial information and forwarding to appropriate department in [facility].    Completed [facility] application and contract.  Patient agreed to all terms of the [facility] contract.  Patient approved to receive medication assistance through 2018 at [facility], as long as eligibility criteria continues to be met.   Provided patient with community resource material based on her particular needs.    Referred patient to [facility]. Patient refused.  Wanted to see a provider in [place] since patient lives in [place].  [facility].  Not accepting new patients.  Contacted the Patient [place].  New patient appointment made for patient on 05/04/17 '@1' :00p.m.  Referred patient for MTM.  [facility]

## 2017-04-27 ENCOUNTER — Ambulatory Visit: Payer: Self-pay | Admitting: Family Medicine

## 2017-05-11 ENCOUNTER — Encounter: Payer: Self-pay | Admitting: Family Medicine

## 2017-05-11 ENCOUNTER — Ambulatory Visit (INDEPENDENT_AMBULATORY_CARE_PROVIDER_SITE_OTHER): Payer: Self-pay | Admitting: Family Medicine

## 2017-05-11 VITALS — BP 140/86 | HR 54 | Temp 98.0°F | Resp 12 | Ht 65.0 in | Wt 289.0 lb

## 2017-05-11 DIAGNOSIS — E119 Type 2 diabetes mellitus without complications: Secondary | ICD-10-CM

## 2017-05-11 DIAGNOSIS — I10 Essential (primary) hypertension: Secondary | ICD-10-CM

## 2017-05-11 DIAGNOSIS — R0602 Shortness of breath: Secondary | ICD-10-CM

## 2017-05-11 DIAGNOSIS — R001 Bradycardia, unspecified: Secondary | ICD-10-CM

## 2017-05-11 DIAGNOSIS — Z794 Long term (current) use of insulin: Secondary | ICD-10-CM

## 2017-05-11 MED ORDER — ALBUTEROL SULFATE HFA 108 (90 BASE) MCG/ACT IN AERS: 1.0000 | INHALATION_SPRAY | Freq: Four times a day (QID) | RESPIRATORY_TRACT | 2 refills | Status: DC | PRN

## 2017-05-11 MED ORDER — FUROSEMIDE 40 MG PO TABS: 40.0000 mg | ORAL_TABLET | Freq: Every day | ORAL | 2 refills | Status: DC

## 2017-05-11 MED ORDER — ASPIRIN EC 81 MG PO TBEC: 81.0000 mg | DELAYED_RELEASE_TABLET | Freq: Every day | ORAL | 2 refills | Status: DC

## 2017-05-11 MED ORDER — MONTELUKAST SODIUM 10 MG PO TABS: 10.0000 mg | ORAL_TABLET | Freq: Every day | ORAL | 3 refills | Status: DC

## 2017-05-11 MED ORDER — ROSUVASTATIN CALCIUM 10 MG PO TABS: 10.0000 mg | ORAL_TABLET | Freq: Every day | ORAL | 3 refills | Status: DC

## 2017-05-11 MED ORDER — EPINEPHRINE 0.3 MG/0.3ML IJ SOAJ: 0.3000 mg | INTRAMUSCULAR | 1 refills | Status: DC | PRN

## 2017-05-11 MED ORDER — POTASSIUM CHLORIDE CRYS ER 20 MEQ PO TBCR
20.0000 meq | EXTENDED_RELEASE_TABLET | Freq: Every day | ORAL | 2 refills | Status: DC
Start: 2017-05-11 — End: 2017-07-07

## 2017-05-11 MED ORDER — NIFEDIPINE ER OSMOTIC RELEASE 30 MG PO TB24: 30.0000 mg | ORAL_TABLET | Freq: Every day | ORAL | 1 refills | Status: DC

## 2017-05-11 NOTE — Patient Instructions (Addendum)
Discontinue atenolol. For hypertension management I am starting you on Procardia 30 mg once daily. I have given you the [place] assistance application, please complete and return to the address indicated on the application.  All other medications have been refilled and submitted to the pharmacy indicated.  The only medication I'm adding new for your asthma management is Singulair which she will take once daily.      Shortness of Breath, Adult Shortness of breath means you have trouble breathing. Your lungs are organs for breathing. Follow these instructions at home: Pay attention to any changes in your symptoms. Take these actions to help with your condition:  Do not smoke. Smoking can cause shortness of breath. If you need help to quit smoking, ask your doctor.  Avoid things that can make it harder to breathe, such as: ? Mold. ? Dust. ? Air pollution. ? Chemical smells. ? Things that can cause allergy symptoms (allergens), if you have allergies.  Keep your living space clean and free of mold and dust.  Rest as needed. Slowly return to your usual activities.  Take over-the-counter and prescription medicines, including oxygen and inhaled medicines, only as told by your doctor.  Keep all follow-up visits as told by your doctor. This is important.  Contact a doctor if:  Your condition does not get better as soon as expected.  You have a hard time doing your normal activities, even after you rest.  You have new symptoms. Get help right away if:  You have trouble breathing when you are resting.  You feel light-headed or you faint.  You have a cough that is not helped by medicines.  You cough up blood.  You have pain with breathing.  You have pain in your chest, arms, shoulders, or belly (abdomen).  You have a fever.  You cannot walk up stairs.  You cannot exercise the way you normally do. This information is not intended to replace advice given to you  by your health care provider. Make sure you discuss any questions you have with your health care provider. Document Released: 12/29/2007 Document Revised: 07/29/2016 Document Reviewed: 07/29/2016 Elsevier Interactive Patient Education  2017 [employer].

## 2017-05-11 NOTE — Progress Notes (Signed)
Patient ID: Nancy Pham, female    DOB: 03/07/1966, 51 y.o.   MRN: [id number]  PCP: Scot Jun, FNP  Chief Complaint  Patient presents with  . Establish Care  . Hospitalization Follow-up   Subjective:  HPI Nancy Pham is a 51 y.o. female presents to establish care and hospital follow-up. Medical problems significant for obesity, allergies, diabetes. On 03/27/2017, [name] was admitted for unstable angina pectoris.Echocardiogram was unremarkable. EKG was only significant for bradycardia. During the hospitalization she reports difficulty breathing and chest wall tightness with a sensation that she was having difficulty moving air.  Reports a medical history significant for allergies and asthma.  Though she currently denies any recent chest pain since her hospitalization she does continue to complain of shortness of breath sensation of chest tightness.  [name] also suffers from type 2 diabetes.  Last A1c 6.1 case great control on Victoza. She is currently on aspirin therapy and has taken atenolol chronically. She was prescribed atenolol for BP control. She had a reaction in the past to ace inhibitors,  During hospitalization due to bradycardia her atenolol dose was decreased however she continues to have persistent asymptomatic bradycardia. She also reports that she suffered angioedema about 2 months ago and was supposed to receive an EpiPen however never received it.  She reports being enrolled in a medical assistance program in which she is able to get her medications for free.  [name] has no additional complaints today and requests refills of her chronic meds.  Social History   Social History  . Marital status: Legally Separated    Spouse name: N/A  . Number of children: N/A  . Years of education: N/A   Occupational History  . Not on file.   Social History Main Topics  . Smoking status: Never Smoker  . Smokeless tobacco: Never Used  . Alcohol use No  . Drug use: No  .  Sexual activity: Not on file   Other Topics Concern  . Not on file   Social History Narrative  . No narrative on file    Family History  Problem Relation Age of Onset  . Family history unknown: Yes   Review of Systems See HPI Patient Active Problem List   Diagnosis Date Noted  . SOB (shortness of breath) 03/27/2017  . Anaphylaxis 03/20/2017    Allergies  Allergen Reactions  . Ace Inhibitors Swelling    Prior to Admission medications   Medication Sig Start Date End Date Taking? Authorizing Provider  albuterol (PROVENTIL HFA;VENTOLIN HFA) 108 (90 Base) MCG/ACT inhaler Inhale 1-2 puffs into the lungs every 6 (six) hours as needed for wheezing or shortness of breath. 03/21/17  Yes Demetrios Loll, MD  aspirin EC 81 MG tablet Take 1 tablet (81 mg total) by mouth daily. 03/21/17  Yes Demetrios Loll, MD  atenolol (TENORMIN) 25 MG tablet Take 1 tablet (25 mg total) by mouth at bedtime. 03/28/17  Yes Loletha Grayer, MD  Cholecalciferol 5000 units capsule Take 5,000 Units by mouth daily.   Yes [provider]  EPINEPHrine 0.3 mg/0.3 mL IJ SOAJ injection Inject 0.3 mLs (0.3 mg total) into the muscle as needed (acute resp failure/worsening anaphylaxis). 03/21/17  Yes Demetrios Loll, MD  furosemide (LASIX) 40 MG tablet Take 1 tablet (40 mg total) by mouth daily. 03/21/17  Yes Demetrios Loll, MD  Liraglutide (VICTOZA) 18 MG/3ML SOLN injection Inject 1.8 mLs (10.8 mg total) into the skin daily. 03/21/17  Yes Demetrios Loll, MD  potassium chloride SA (  K-DUR,KLOR-CON) 20 MEQ tablet Take 1 tablet (20 mEq total) by mouth daily. 03/21/17  Yes Demetrios Loll, MD  Probiotic Product (PROBIOTIC DAILY PO) Take 1 capsule by mouth daily.   Yes [provider]  rosuvastatin (CRESTOR) 10 MG tablet Take 1 tablet (10 mg total) by mouth daily. 03/21/17  Yes Demetrios Loll, MD  famotidine (PEPCID) 20 MG tablet Take 1 tablet (20 mg total) by mouth 2 (two) times daily. Patient not taking: Reported on 05/11/2017 03/21/17   Demetrios Loll, MD    Past Medical, Surgical Family and Social History reviewed and updated.    Objective:   Today's Vitals   05/11/17 1335  BP: 140/86  Pulse: (!) 54  Resp: 12  Temp: 98 F (36.7 C)  TempSrc: Oral  SpO2: 99%  Weight: 289 lb (131.1 kg)  Height: 5\' 5"  (1.651 m)    Wt Readings from Last 3 Encounters:  05/11/17 289 lb (131.1 kg)  03/28/17 286 lb (129.7 kg)  03/20/17 290 lb 3.2 oz (131.6 kg)   Physical Exam  Constitutional: She is oriented to person, place, and time. She appears well-developed and well-nourished.  HENT:  Head: Normocephalic and atraumatic.  Eyes: Pupils are equal, round, and reactive to light. Conjunctivae and EOM are normal.  Neck: No thyromegaly present.  Cardiovascular: Regular rhythm, normal heart sounds and intact distal pulses.  Bradycardia present.   Pulmonary/Chest: Effort normal and breath sounds normal. She has no wheezes.  Abdominal: Soft. Bowel sounds are normal.  Lymphadenopathy:    She has no cervical adenopathy.  Neurological: She is alert and oriented to person, place, and time.  Skin: Skin is warm and dry.  Psychiatric: She has a normal mood and affect. Her behavior is normal. Judgment and thought content normal.   Assessment & Plan:  1. Shortness of breath, history of asthma, start albuterol 1-2 puffs every 4-6 hours as needed for shortness of breath. Resume use of Singulair 10 mg daily at bedtime.  2. Morbid obesity ([place]), encourage physical activity as tolerated.  Will also continue Victoza. 3. Bradycardia, continue on atenolol. 4. Hypertension , Essential, uncontrolled, goal 130/80 or less -Continued atenolol start nifedipine 30 mg once daily ER 5.  Type 2 diabetes mellitus, long-term current use of insulin,A1c is 6.1, continue Victoza, increase physical activity as tolerated.   Meds ordered this encounter  Medications  . NIFEdipine (PROCARDIA XL) 30 MG 24 hr tablet    Sig: Take 1 tablet (30 mg total) by mouth daily.     Dispense:  30 tablet    Refill:  1    Order Specific Question:   Supervising Provider    Answer:   Tresa Garter [id number]  . furosemide (LASIX) 40 MG tablet    Sig: Take 1 tablet (40 mg total) by mouth daily.    Dispense:  30 tablet    Refill:  2    Order Specific Question:   Supervising Provider    Answer:   Tresa Garter [id number]  . rosuvastatin (CRESTOR) 10 MG tablet    Sig: Take 1 tablet (10 mg total) by mouth daily.    Dispense:  30 tablet    Refill:  3    Order Specific Question:   Supervising Provider    Answer:   Tresa Garter [id number]  . potassium chloride SA (K-DUR,KLOR-CON) 20 MEQ tablet    Sig: Take 1 tablet (20 mEq total) by mouth daily.    Dispense:  30 tablet  Refill:  2    Order Specific Question:   Supervising Provider    Answer:   Tresa Garter [id number]  . EPINEPHrine 0.3 mg/0.3 mL IJ SOAJ injection    Sig: Inject 0.3 mLs (0.3 mg total) into the muscle as needed (acute resp failure/worsening anaphylaxis).    Dispense:  3 Device    Refill:  1    Order Specific Question:   Supervising Provider    Answer:   Tresa Garter [id number]  . aspirin EC 81 MG tablet    Sig: Take 1 tablet (81 mg total) by mouth daily.    Dispense:  30 tablet    Refill:  2    Order Specific Question:   Supervising Provider    Answer:   Tresa Garter [id number]  . albuterol (PROVENTIL HFA;VENTOLIN HFA) 108 (90 Base) MCG/ACT inhaler    Sig: Inhale 1-2 puffs into the lungs every 6 (six) hours as needed for wheezing or shortness of breath.    Dispense:  8 g    Refill:  2    Order Specific Question:   Supervising Provider    Answer:   Tresa Garter [id number]  . montelukast (SINGULAIR) 10 MG tablet    Sig: Take 1 tablet (10 mg total) by mouth at bedtime.    Dispense:  30 tablet    Refill:  3    Order Specific Question:   Supervising Provider    Answer:   Tresa Garter [[id number]]   Return for care in 3 months   Carroll Sage.  Kenton Kingfisher, MSN, FNP-C [facility]  [address] [place], [place] [address] [phone]

## 2017-05-25 ENCOUNTER — Ambulatory Visit: Payer: Self-pay | Admitting: Family Medicine

## 2017-05-25 VITALS — BP 142/80 | HR 68

## 2017-05-25 DIAGNOSIS — Z0130 Encounter for examination of blood pressure without abnormal findings: Secondary | ICD-10-CM

## 2017-05-25 MED ORDER — NIFEDIPINE ER 60 MG PO TB24: 60.0000 mg | ORAL_TABLET | Freq: Every day | ORAL | 1 refills | Status: DC

## 2017-05-25 NOTE — Progress Notes (Signed)
Increase nifedipine 60 mg once daily to achieve blood pressure of 130/80.  Carroll Sage. Kenton Kingfisher, MSN, FNP-C [facility]  [address] [place], [place] [address] [phone]

## 2017-06-13 ENCOUNTER — Ambulatory Visit: Payer: Self-pay | Admitting: Pharmacist

## 2017-06-13 ENCOUNTER — Other Ambulatory Visit: Payer: Self-pay

## 2017-06-13 ENCOUNTER — Encounter (INDEPENDENT_AMBULATORY_CARE_PROVIDER_SITE_OTHER): Payer: Self-pay

## 2017-06-13 VITALS — BP 142/72 | HR 72 | Wt 287.0 lb

## 2017-06-13 DIAGNOSIS — Z79899 Other long term (current) drug therapy: Secondary | ICD-10-CM

## 2017-06-13 MED ORDER — FUROSEMIDE 40 MG PO TABS: 40.0000 mg | ORAL_TABLET | Freq: Every day | ORAL | 2 refills | Status: DC

## 2017-06-13 NOTE — Telephone Encounter (Signed)
Refill for furosemide sent into pharmacy. Thanks!  

## 2017-06-13 NOTE — Progress Notes (Signed)
  Medication Management Clinic Visit Note  Patient: Nancy Pham MRN: [id number] Date of Birth: 1965-10-13 PCP: Scot Jun, FNP   Dellia Cloud 51 y.o. female presents for a medication management clinic with pharmacist today.  BP (!) 142/72   Pulse 72   Wt 287 lb (130.2 kg)   BMI 47.76 kg/m   Patient Information   Past Medical History:  Diagnosis Date  . Asthma   . Cancer ([place])   . Diabetes mellitus   . Gout   . Hyperlipidemia   . Hypertension      No past surgical history on file.   Family History  Family history unknown: Yes     Social History   Substance and Sexual Activity  Alcohol Use No     Social History   Tobacco Use  Smoking Status Never Smoker  Smokeless Tobacco Never Used      Health Maintenance  Topic Date Due  . PNEUMOCOCCAL POLYSACCHARIDE VACCINE (1) 02/11/1967  . FOOT EXAM  02/11/1975  . OPHTHALMOLOGY EXAM  02/11/1975  . URINE MICROALBUMIN  02/11/1975  . PAP SMEAR  02/10/1986  . COLONOSCOPY  02/11/2015  . INFLUENZA VACCINE  10/23/2017 (Originally 02/23/2017)  . TETANUS/TDAP  05/12/2027 (Originally 02/11/1984)  . HEMOGLOBIN A1C  09/24/2017  . MAMMOGRAM  10/23/2017  . HIV Screening  Completed     Assessment and Plan:  Provided pill box for patient to use. Reports has difficulty with transportation.   1. Hypertension: Uncontrolled with BP 142/72 today. Has recently started nifedipine and discontinued atenolol due to bradycardia. HR 72 bpm at visit today. Does not check BP at home and denies any signs or symptoms of low blood pressure. Continuing furosemide for fluid retention. Patient states has lost ~10 pounds since starting furosemide and follows low sodium diet. Does have scale at home and counseled patient on weighing daily and calling doctor if large increase in weight (>5pounds in a week).   2. Type II Diabetes: Controlled, most recent A1c 6.1%. Patient states does not check BG daily, but knows she should. When she does check,  FBG ~90mg /dL and post-prandial BG ~180mg /dL. Denies any recent lows (70mg /dL) and denies any signs or symptoms of low BG. Counseled patient on what to do when experiencing low BG. States has not been able to walk for exercise due to 'body pain' which she says is fibromyalgia. Talked about the importance of exercise.   3. Shortness of breath: Controlled; States has not needed albuterol inhaler for the past week. Patient thinks SOB was exacerbated by air freshener she had been using in her house-has stopped using air freshener and SOB has improved. Counseled patient on proper use of inhaler. Continue montelukast every night at bedtime.   4. Gout: Patient states has not filled allopurinol prescription yet. Denies any recent gout attacks. Reviewed gout diet and printed off list of foods to avoid and foods to eat to decrease risk of gout reoccurrence.   5. Pain; Fibromyalgia: Patient states she had tramadol for the pain which helped and said she cannot take Lyrica. Patient to follow up with PCP at next appointment.   6. Trouble sleeping: Patient states has trouble sleeping but is scared to start any medications for sleep. Talked to patient about melatonin and still somewhat resistant to this idea. Will follow up with PCP.   Will see patient back in medication management clinic in 6 months of sooner if any questions or concerns arise.   Candelaria Stagers, PharmD Pharmacy Resident

## 2017-06-24 ENCOUNTER — Encounter: Payer: Self-pay | Admitting: Family Medicine

## 2017-06-24 ENCOUNTER — Ambulatory Visit (INDEPENDENT_AMBULATORY_CARE_PROVIDER_SITE_OTHER): Payer: Self-pay | Admitting: Family Medicine

## 2017-06-24 VITALS — BP 140/82 | HR 86 | Temp 97.7°F | Resp 16 | Ht 65.0 in | Wt 286.0 lb

## 2017-06-24 DIAGNOSIS — E786 Lipoprotein deficiency: Secondary | ICD-10-CM

## 2017-06-24 DIAGNOSIS — E119 Type 2 diabetes mellitus without complications: Secondary | ICD-10-CM

## 2017-06-24 DIAGNOSIS — E781 Pure hyperglyceridemia: Secondary | ICD-10-CM

## 2017-06-24 DIAGNOSIS — M545 Low back pain, unspecified: Secondary | ICD-10-CM

## 2017-06-24 DIAGNOSIS — I10 Essential (primary) hypertension: Secondary | ICD-10-CM

## 2017-06-24 LAB — POCT URINALYSIS DIP (DEVICE)
Bilirubin Urine: NEGATIVE
Glucose, UA: NEGATIVE mg/dL
HGB URINE DIPSTICK: NEGATIVE
Ketones, ur: NEGATIVE mg/dL
LEUKOCYTES UA: NEGATIVE
Nitrite: NEGATIVE
PROTEIN: NEGATIVE mg/dL
SPECIFIC GRAVITY, URINE: 1.015 (ref 1.005–1.030)
UROBILINOGEN UA: 0.2 mg/dL (ref 0.0–1.0)
pH: 6 (ref 5.0–8.0)

## 2017-06-24 MED ORDER — CYCLOBENZAPRINE HCL 10 MG PO TABS: 10.0000 mg | ORAL_TABLET | Freq: Three times a day (TID) | ORAL | 1 refills | Status: DC | PRN

## 2017-06-24 MED ORDER — PREDNISONE 20 MG PO TABS: ORAL_TABLET | ORAL | 0 refills | Status: DC

## 2017-06-24 MED ORDER — NIFEDIPINE ER 60 MG PO TB24: 60.0000 mg | ORAL_TABLET | Freq: Every day | ORAL | 1 refills | Status: DC

## 2017-06-24 NOTE — Progress Notes (Signed)
Patient ID: Nancy Pham, female    DOB: 03-15-1966, 51 y.o.   MRN: [id number]  PCP: Scot Jun, FNP  Chief Complaint  Patient presents with  . Hypertension Follow-up    Subjective:  HPI Nancy Pham is a 51 y.o. female presents for evaluation of hypertension, and type 2 diabetes.  Recently blood pressure has been persistently elevated and not at goal of 130/80. She complains of shortness of breath with with activity such as going up stair, although she is negative of dyspnea at rest.  Denies chest pain, weakness, or dizziness. [name] is also prediabetic, last A1C 6.1. She doesn't exercise. She doesn't monitor her blood glucose level at home.  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 She complains of lateral side foot pain of right foot. This is a new problems. Denies injury and notes pain is exacerbated by prolonged walking and certain foot wear. [name] continues to suffer from low lumbar back pain which has been a chronic problem. Typically pains is intermitted, although, most recent episode flared up and remained present for a few weeks ago. This current episode has been occurring for several weeks. The pain is characterized as aching and she has not attempted relief with  NSAIDs or any other OTC.  Social History   Socioeconomic History  . Marital status: Legally Separated    Spouse name: Not on file  . Number of children: Not on file  . Years of education: Not on file  . Highest education level: Not on file  Social Needs  . Financial resource strain: Not on file  . Food insecurity - worry: Not on file  . Food insecurity - inability: Not on file  . Transportation needs - medical: Not on file  . Transportation needs - non-medical: Not on file  Occupational History  . Not on file  Tobacco Use  . Smoking status: Never Smoker  . Smokeless tobacco: Never Used   Substance and Sexual Activity  . Alcohol use: No  . Drug use: No  . Sexual activity: Not on file  Other Topics Concern  . Not on file  Social History Narrative  . Not on file    Family History  Family history unknown: Yes   Review of Systems Constitutional: Negative for fever, chills, diaphoresis, activity change, appetite change and fatigue. HENT: Negative for ear pain, nosebleeds, congestion, facial swelling, rhinorrhea, neck pain, neck stiffness and ear discharge.  Eyes: Negative for pain, discharge, redness, itching and visual disturbance. Respiratory: Negative for cough, choking, chest tightness, shortness of breath, wheezing and stridor.  Cardiovascular: Negative for chest pain, palpitations and leg swelling. Gastrointestinal: Negative for abdominal distention. Genitourinary: Negative for dysuria, urgency, frequency, hematuria, flank pain, decreased urine volume, difficulty urinating and dyspareunia.  Musculoskeletal: Positive  for back pain, Positive right foot pain,  negative joint swelling, negative arthralgia and gait problem. Neurological: Negative for dizziness, tremors, seizures, syncope, facial asymmetry, speech difficulty, weakness, light-headedness, numbness and headaches.  Hematological: Negative for adenopathy. Does not bruise/bleed easily. Psychiatric/Behavioral: Negative for hallucinations, behavioral problems, confusion, dysphoric mood, decreased concentration and agitation.  Patient Active Problem List   Diagnosis Date Noted  . SOB (shortness of breath) 03/27/2017  . Anaphylaxis 03/20/2017    Allergies  Allergen Reactions  . Ace Inhibitors Swelling    Prior to Admission medications   Medication Sig Start Date End Date Taking? Authorizing Provider  albuterol (PROVENTIL HFA;VENTOLIN  HFA) 108 (90 Base) MCG/ACT inhaler Inhale 1-2 puffs into the lungs every 6 (six) hours as needed for wheezing or shortness of breath. 05/11/17  Yes Scot Jun, FNP   allopurinol (ZYLOPRIM) 300 MG tablet Take 300 mg daily by mouth.   Yes [provider]  aspirin EC 81 MG tablet Take 1 tablet (81 mg total) by mouth daily. 05/11/17  Yes Scot Jun, FNP  Cholecalciferol 5000 units capsule Take 5,000 Units by mouth daily.   Yes [provider]  EPINEPHrine 0.3 mg/0.3 mL IJ SOAJ injection Inject 0.3 mLs (0.3 mg total) into the muscle as needed (acute resp failure/worsening anaphylaxis). 05/11/17  Yes Scot Jun, FNP  famotidine (PEPCID) 20 MG tablet Take 1 tablet (20 mg total) by mouth 2 (two) times daily. 03/21/17  Yes Demetrios Loll, MD  furosemide (LASIX) 40 MG tablet Take 1 tablet (40 mg total) daily by mouth. 06/13/17  Yes Dorena Dew, FNP  Liraglutide (VICTOZA) 18 MG/3ML SOLN injection Inject 1.8 mLs (10.8 mg total) into the skin daily. 03/21/17  Yes Demetrios Loll, MD  montelukast (SINGULAIR) 10 MG tablet Take 1 tablet (10 mg total) by mouth at bedtime. 05/11/17  Yes Scot Jun, FNP  NIFEdipine (PROCARDIA-XL/ADALAT CC) 60 MG 24 hr tablet Take 1 tablet (60 mg total) by mouth daily. 05/25/17  Yes Scot Jun, FNP  potassium chloride SA (K-DUR,KLOR-CON) 20 MEQ tablet Take 1 tablet (20 mEq total) by mouth daily. 05/11/17  Yes Scot Jun, FNP  Probiotic Product (PROBIOTIC DAILY PO) Take 1 capsule by mouth daily.   Yes [provider]  rosuvastatin (CRESTOR) 10 MG tablet Take 1 tablet (10 mg total) by mouth daily. 05/11/17  Yes Scot Jun, FNP    Past Medical, Surgical Family and Social History reviewed and updated.    Objective:   Today's Vitals   06/24/17 1443  BP: 140/82  Pulse: 86  Resp: 16  Temp: 97.7 F (36.5 C)  TempSrc: Oral  SpO2: 99%  Weight: 286 lb (129.7 kg)  Height: 5\' 5"  (1.651 m)    Wt Readings from Last 3 Encounters:  06/24/17 286 lb (129.7 kg)  06/13/17 287 lb (130.2 kg)  05/11/17 289 lb (131.1 kg)    Physical Exam Physical Exam: Constitutional: Patient  appears well-developed and well-nourished. No distress. HENT: Normocephalic, atraumatic, External right and left ear normal. Oropharynx is clear and moist.  Eyes: Conjunctivae and EOM are normal. PERRLA, no scleral icterus. Neck: Normal ROM. Neck supple. No JVD. No tracheal deviation. No thyromegaly. CVS: RRR, S1/S2 +, no murmurs, no gallops, no carotid bruit.  Pulmonary: Effort and breath sounds normal, no stridor, rhonchi, wheezes, rales.  Abdominal: Soft. BS +, no distension, tenderness, rebound or guarding.  Musculoskeletal: Normal range of motion. No edema and no tenderness reproduced with exam. Lymphadenopathy: No lymphadenopathy noted, cervical, inguinal or axillary Neuro: Alert. Normal reflexes, muscle tone coordination. No cranial nerve deficit. Skin: Skin is warm and dry. No rash noted. Not diaphoretic. No erythema. No pallor. Psychiatric: Normal mood and affect. Behavior, judgment, thought content normal.    Assessment & Plan:  1. Type 2 diabetes mellitus without complication, without long-term current use of insulin ([place]), A1C today 6.4.  Slight increase from prior A1c however remains within range and is well controlled.  Continue to encourage patient to engage in physical activity with the recommended of 150 minutes/week of vigorous activity.  Continue to reduce intake of foods simple carbs and simple sugars.  Continue Victoza.  2. 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  He is not fasting today will not repeat.  Will continue Crestor 10 mg daily.  Commended DASH diet   3. Essential hypertension, stable, not at goal today. Goal blood pressure 140/90. Continue current regimen.  Recommended DASH diet.  Recommend increase physical activity to recommend 150 minutes/week of vigorous activity.   4. Bilateral low back pain without sciatica, unspecified  chronicity, ongoing stable chronic condition, with an acute exacerbation.  Treat with prednisone taper.  Take medication as prescribed.  5. Right foot pain, acute, patient suffered no injury right foot.  She has been treated with prednisone for chronic ongoing back pain we will not add any additional medication for foot pain today.  Commended warm salt foot soaks and will evaluate the effectiveness of prednisone on improving foot pain.  Orders Placed This Encounter  Procedures  . Lipid panel  . CBC with Differential  . Basic metabolic panel  . Hemoglobin A1c  . POCT urinalysis dip (device)    Meds ordered this encounter  Medications  . DISCONTD: cyclobenzaprine (FLEXERIL) 10 MG tablet    Sig: Take 1 tablet (10 mg total) by mouth 3 (three) times daily as needed for muscle spasms.    Dispense:  90 tablet    Refill:  1    Order Specific Question:   Supervising Provider    Answer:   Tresa Garter [id number]  . DISCONTD: predniSONE (DELTASONE) 20 MG tablet    Sig: Take 3 PO QAM x3days, 2 PO QAM x3days, 1 PO QAM x3days    Dispense:  18 tablet    Refill:  0    Order Specific Question:   Supervising Provider    Answer:   Tresa Garter [id number]  . NIFEdipine (PROCARDIA-XL/ADALAT CC) 60 MG 24 hr tablet    Sig: Take 1 tablet (60 mg total) by mouth daily.    Dispense:  90 tablet    Refill:  1    Dose change    Order Specific Question:   Supervising Provider    Answer:   Tresa Garter [id number]  . cyclobenzaprine (FLEXERIL) 10 MG tablet    Sig: Take 1 tablet (10 mg total) by mouth 3 (three) times daily as needed for muscle spasms.    Dispense:  90 tablet    Refill:  1    Order Specific Question:   Supervising Provider    Answer:   Tresa Garter [id number]  . predniSONE (DELTASONE) 20 MG tablet    Sig: Take 3 PO QAM x3days, 2 PO QAM x3days, 1 PO QAM x3days    Dispense:  18 tablet    Refill:  0    Order Specific Question:   Supervising Provider    Answer:    Tresa Garter [[id number]]     RTC:  Keep scheduled follow-up on file.   Carroll Sage. Kenton Kingfisher, MSN, FNP-C [facility]  [address] C[address] [place], [place] [address] [phone]

## 2017-06-25 LAB — CBC WITH DIFFERENTIAL/PLATELET
BASOS PCT: 0.5 %
Basophils Absolute: 33 cells/uL (ref 0–200)
Eosinophils Absolute: 191 cells/uL (ref 15–500)
Eosinophils Relative: 2.9 %
HCT: 36.7 % (ref 35.0–45.0)
Hemoglobin: 12.4 g/dL (ref 11.7–15.5)
Lymphs Abs: 2528 cells/uL (ref 850–3900)
MCH: 28.6 pg (ref 27.0–33.0)
MCHC: 33.8 g/dL (ref 32.0–36.0)
MCV: 84.6 fL (ref 80.0–100.0)
MONOS PCT: 7.2 %
MPV: 9.8 fL (ref 7.5–12.5)
Neutro Abs: 3373 cells/uL (ref 1500–7800)
Neutrophils Relative %: 51.1 %
PLATELETS: 319 10*3/uL (ref 140–400)
RBC: 4.34 10*6/uL (ref 3.80–5.10)
RDW: 13.9 % (ref 11.0–15.0)
TOTAL LYMPHOCYTE: 38.3 %
WBC mixed population: 475 cells/uL (ref 200–950)
WBC: 6.6 10*3/uL (ref 3.8–10.8)

## 2017-06-25 LAB — BASIC METABOLIC PANEL
BUN: 14 mg/dL (ref 7–25)
CHLORIDE: 100 mmol/L (ref 98–110)
CO2: 32 mmol/L (ref 20–32)
Calcium: 9.8 mg/dL (ref 8.6–10.4)
Creat: 0.73 mg/dL (ref 0.50–1.05)
Glucose, Bld: 118 mg/dL — ABNORMAL HIGH (ref 65–99)
Potassium: 3.8 mmol/L (ref 3.5–5.3)
Sodium: 139 mmol/L (ref 135–146)

## 2017-06-25 LAB — LIPID PANEL
CHOLESTEROL: 190 mg/dL (ref <200)
HDL: 64 mg/dL (ref >50)
LDL CHOLESTEROL (CALC): 95 mg/dL
Non-HDL Cholesterol (Calc): 126 mg/dL (calc) (ref <130)
TRIGLYCERIDES: 211 mg/dL — AB (ref <150)
Total CHOL/HDL Ratio: 3 (calc) (ref <5.0)

## 2017-06-25 LAB — HEMOGLOBIN A1C
Hgb A1c MFr Bld: 6.4 % of total Hgb — ABNORMAL HIGH (ref <5.7)
Mean Plasma Glucose: 137 (calc)
eAG (mmol/L): 7.6 (calc)

## 2017-07-04 ENCOUNTER — Encounter: Payer: Self-pay | Admitting: Family Medicine

## 2017-07-04 NOTE — Progress Notes (Signed)
Mail attached letter to patient

## 2017-07-06 ENCOUNTER — Other Ambulatory Visit: Payer: Self-pay

## 2017-07-06 DIAGNOSIS — I10 Essential (primary) hypertension: Secondary | ICD-10-CM

## 2017-07-07 MED ORDER — ROSUVASTATIN CALCIUM 10 MG PO TABS: 10.0000 mg | ORAL_TABLET | Freq: Every day | ORAL | 3 refills | Status: DC

## 2017-07-07 MED ORDER — CYCLOBENZAPRINE HCL 10 MG PO TABS: 10.0000 mg | ORAL_TABLET | Freq: Three times a day (TID) | ORAL | 1 refills | Status: DC | PRN

## 2017-07-07 MED ORDER — FUROSEMIDE 40 MG PO TABS: 40.0000 mg | ORAL_TABLET | Freq: Every day | ORAL | 2 refills | Status: DC

## 2017-07-07 MED ORDER — MONTELUKAST SODIUM 10 MG PO TABS: 10.0000 mg | ORAL_TABLET | Freq: Every day | ORAL | 3 refills | Status: DC

## 2017-07-07 MED ORDER — POTASSIUM CHLORIDE CRYS ER 20 MEQ PO TBCR: 20.0000 meq | EXTENDED_RELEASE_TABLET | Freq: Every day | ORAL | 2 refills | Status: DC

## 2017-07-07 MED ORDER — NIFEDIPINE ER 60 MG PO TB24: 60.0000 mg | ORAL_TABLET | Freq: Every day | ORAL | 1 refills | Status: DC

## 2017-07-07 MED ORDER — ALBUTEROL SULFATE HFA 108 (90 BASE) MCG/ACT IN AERS: 1.0000 | INHALATION_SPRAY | Freq: Four times a day (QID) | RESPIRATORY_TRACT | 2 refills | Status: DC | PRN

## 2017-07-07 MED ORDER — ALLOPURINOL 300 MG PO TABS: 300.0000 mg | ORAL_TABLET | Freq: Every day | ORAL | 1 refills | Status: DC

## 2017-07-29 ENCOUNTER — Telehealth: Payer: Self-pay | Admitting: Pharmacist

## 2017-07-29 NOTE — Telephone Encounter (Signed)
07/29/17 Mailing denial letter to patient, unable to order Victoza & tips because patient never returned paperwork we mailed to her 05/25/17.Delos Haring

## 2017-08-12 ENCOUNTER — Encounter: Payer: Self-pay | Admitting: Family Medicine

## 2017-08-12 ENCOUNTER — Ambulatory Visit (INDEPENDENT_AMBULATORY_CARE_PROVIDER_SITE_OTHER): Payer: Self-pay | Admitting: Family Medicine

## 2017-08-12 VITALS — BP 140/86 | HR 93 | Temp 98.3°F | Resp 14 | Ht 65.0 in | Wt 280.0 lb

## 2017-08-12 DIAGNOSIS — I10 Essential (primary) hypertension: Secondary | ICD-10-CM

## 2017-08-12 DIAGNOSIS — E119 Type 2 diabetes mellitus without complications: Secondary | ICD-10-CM

## 2017-08-12 DIAGNOSIS — M79671 Pain in right foot: Secondary | ICD-10-CM

## 2017-08-12 LAB — POCT URINALYSIS DIP (DEVICE)
Bilirubin Urine: NEGATIVE
Glucose, UA: NEGATIVE mg/dL
Hgb urine dipstick: NEGATIVE
KETONES UR: NEGATIVE mg/dL
Leukocytes, UA: NEGATIVE
Nitrite: NEGATIVE
PH: 5.5 (ref 5.0–8.0)
PROTEIN: 30 mg/dL — AB
Urobilinogen, UA: 0.2 mg/dL (ref 0.0–1.0)

## 2017-08-12 LAB — GLUCOSE, CAPILLARY: Glucose-Capillary: 120 mg/dL — ABNORMAL HIGH (ref 65–99)

## 2017-08-12 MED ORDER — PREDNISONE 20 MG PO TABS: ORAL_TABLET | ORAL | 0 refills | Status: DC

## 2017-08-12 MED ORDER — POTASSIUM CHLORIDE CRYS ER 20 MEQ PO TBCR: 20.0000 meq | EXTENDED_RELEASE_TABLET | Freq: Every day | ORAL | 2 refills | Status: DC

## 2017-08-12 MED ORDER — BENZONATATE 100 MG PO CAPS: 100.0000 mg | ORAL_CAPSULE | Freq: Three times a day (TID) | ORAL | 0 refills | Status: DC | PRN

## 2017-08-12 NOTE — Progress Notes (Signed)
Patient ID: Nancy Pham, female    DOB: 1966/01/23, 52 y.o.   MRN: [id number]  PCP: Scot Jun, FNP  Chief Complaint  Patient presents with  . Follow-up    diabetes    Subjective:  HPI Nancy Pham is a 52 y.o. female presents for evaluation of diabetes and hypertension. Medical history significant for type 2 diabetes mellitus, gout, uncontrolled hypertension, and chronic pain. Reports that she has no taken any  medication prior to today's office visit. Last A1C 6.4. Khrista complains today of persistent right lateral foot pain which is worsened with standing. Previously treated with prednisone although report that she never pick-up medication. Foot pain is only persistent with weightbearing.  She characterizes pain at it's worst as a "ripping sensation". She has not attempted relief with any of other medications. Social History   Socioeconomic History  . Marital status: Legally Separated    Spouse name: Not on file  . Number of children: Not on file  . Years of education: Not on file  . Highest education level: Not on file  Social Needs  . Financial resource strain: Not on file  . Food insecurity - worry: Not on file  . Food insecurity - inability: Not on file  . Transportation needs - medical: Not on file  . Transportation needs - non-medical: Not on file  Occupational History  . Not on file  Tobacco Use  . Smoking status: Never Smoker  . Smokeless tobacco: Never Used  Substance and Sexual Activity  . Alcohol use: No  . Drug use: No  . Sexual activity: Not on file  Other Topics Concern  . Not on file  Social History Narrative  . Not on file    Family History  Family history unknown: Yes     Review of Systems  Patient Active Problem List   Diagnosis Date Noted  . SOB (shortness of breath) 03/27/2017  . Anaphylaxis 03/20/2017    Allergies  Allergen Reactions  . Ace Inhibitors Swelling    Prior to Admission medications   Medication Sig Start  Date End Date Taking? Authorizing Provider  albuterol (PROVENTIL HFA;VENTOLIN HFA) 108 (90 Base) MCG/ACT inhaler Inhale 1-2 puffs into the lungs every 6 (six) hours as needed for wheezing or shortness of breath. 07/07/17  Yes Scot Jun, FNP  allopurinol (ZYLOPRIM) 300 MG tablet Take 1 tablet (300 mg total) by mouth daily. 07/07/17  Yes Scot Jun, FNP  aspirin EC 81 MG tablet Take 1 tablet (81 mg total) by mouth daily. 05/11/17  Yes Scot Jun, FNP  Cholecalciferol 5000 units capsule Take 5,000 Units by mouth daily.   Yes [provider]  cyclobenzaprine (FLEXERIL) 10 MG tablet Take 1 tablet (10 mg total) by mouth 3 (three) times daily as needed for muscle spasms. 07/07/17  Yes Scot Jun, FNP  EPINEPHrine 0.3 mg/0.3 mL IJ SOAJ injection Inject 0.3 mLs (0.3 mg total) into the muscle as needed (acute resp failure/worsening anaphylaxis). 05/11/17  Yes Scot Jun, FNP  famotidine (PEPCID) 20 MG tablet Take 1 tablet (20 mg total) by mouth 2 (two) times daily. 03/21/17  Yes Demetrios Loll, MD  furosemide (LASIX) 40 MG tablet Take 1 tablet (40 mg total) by mouth daily. 07/07/17  Yes Scot Jun, FNP  Liraglutide (VICTOZA) 18 MG/3ML SOLN injection Inject 1.8 mLs (10.8 mg total) into the skin daily. 03/21/17  Yes Demetrios Loll, MD  montelukast (SINGULAIR) 10 MG tablet Take 1 tablet (10 mg  total) by mouth at bedtime. 07/07/17  Yes Scot Jun, FNP  NIFEdipine (PROCARDIA-XL/ADALAT CC) 60 MG 24 hr tablet Take 1 tablet (60 mg total) by mouth daily. 07/07/17  Yes Scot Jun, FNP  potassium chloride SA (K-DUR,KLOR-CON) 20 MEQ tablet Take 1 tablet (20 mEq total) by mouth daily. 07/07/17  Yes Scot Jun, FNP  Probiotic Product (PROBIOTIC DAILY PO) Take 1 capsule by mouth daily.   Yes [provider]  rosuvastatin (CRESTOR) 10 MG tablet Take 1 tablet (10 mg total) by mouth daily. 07/07/17  Yes Scot Jun, FNP  predniSONE  (DELTASONE) 20 MG tablet Take 3 PO QAM x3days, 2 PO QAM x3days, 1 PO QAM x3days Patient not taking: Reported on 08/12/2017 06/24/17   Scot Jun, FNP    Past Medical, Surgical Family and Social History reviewed and updated.    Objective:   Today's Vitals   08/12/17 1410  BP: 140/86  Pulse: 93  Resp: 14  Temp: 98.3 F (36.8 C)  TempSrc: Oral  SpO2: 92%  Weight: 280 lb (127 kg)  Height: 5\' 5"  (1.651 m)    Wt Readings from Last 3 Encounters:  08/12/17 280 lb (127 kg)  06/24/17 286 lb (129.7 kg)  06/13/17 287 lb (130.2 kg)    Physical Exam  Constitutional: She is oriented to person, place, and time. She appears well-developed and well-nourished.  HENT:  Head: Normocephalic and atraumatic.  Right Ear: External ear normal.  Left Ear: External ear normal.  Nose: Nose normal.  Mouth/Throat: Oropharynx is clear and moist.  Eyes: Pupils are equal, round, and reactive to light.  Neck: Normal range of motion. Neck supple. No thyromegaly present.  Cardiovascular: Normal rate, regular rhythm, normal heart sounds and intact distal pulses.  Pulmonary/Chest: Effort normal and breath sounds normal.  Musculoskeletal: Normal range of motion.       Right foot: There is tenderness. There is normal range of motion and no bony tenderness.       Feet:  Lymphadenopathy:    She has no cervical adenopathy.  Neurological: She is alert and oriented to person, place, and time.            Assessment & Plan:  1. Type 2 diabetes mellitus without complication, without long-term current use of insulin ([place]), A1C today 6.4.  Slight increase from prior A1c however remains within range and is well controlled.  Continue to encourage patient to engage in physical activity with the recommended of 150 minutes/week of vigorous activity.  Continue to reduce intake of foods simple carbs and simple sugars.  Continue Victoza.  2. Essential hypertension, stable BP today.  Goal blood pressure less than  130/90.  Reinforced the importance of continued compliance with antihypertension medication. We have discussed target BP range and blood pressure goal. I have advised patient to check BP regularly and to call us back or report to clinic if the numbers are consistently higher than 140/90. We discussed the importance of compliance with medical therapy and DASH diet recommended, consequences of uncontrolled hypertension discussed.    3. Right foot pain, lateral, negative for malleolus tenderness which rules out the likelihood of ankle involvement. Recommend prednisone taper and purchase otc orthotics for additional support with weight-bearing. If symptoms worsen will refer to podiatry once patient applies for [facility] financial application.   Meds ordered this encounter  Medications  . predniSONE (DELTASONE) 20 MG tablet    Sig: Take 3 PO QAM x3days, 2 PO QAM x3days,  1 PO QAM x3days    Dispense:  18 tablet    Refill:  0    Order Specific Question:   Supervising Provider    Answer:   Tresa Garter [id number]  . potassium chloride SA (K-DUR,KLOR-CON) 20 MEQ tablet    Sig: Take 1 tablet (20 mEq total) by mouth daily.    Dispense:  30 tablet    Refill:  2    Order Specific Question:   Supervising Provider    Answer:   Tresa Garter [id number]  . benzonatate (TESSALON) 100 MG capsule    Sig: Take 1-2 capsules (100-200 mg total) by mouth 3 (three) times daily as needed for cough.    Dispense:  40 capsule    Refill:  0    Order Specific Question:   Supervising Provider    Answer:   Tresa Garter [id number]    RTC: 3 months for chronic conditions.   Carroll Sage. Kenton Kingfisher, MSN, FNP-C [facility]  [address] [place], [place] [address] [phone]

## 2017-08-12 NOTE — Patient Instructions (Addendum)
Hypertension Hypertension is another name for high blood pressure. High blood pressure forces your heart to work harder to pump blood. This can cause problems over time. There are two numbers in a blood pressure reading. There is a top number (systolic) over a bottom number (diastolic). It is best to have a blood pressure below 120/80. Healthy choices can help lower your blood pressure. You may need medicine to help lower your blood pressure if:  Your blood pressure cannot be lowered with healthy choices.  Your blood pressure is higher than 130/80.  Follow these instructions at home: Eating and drinking  If directed, follow the DASH eating plan. This diet includes: ? Filling half of your plate at each meal with fruits and vegetables. ? Filling one quarter of your plate at each meal with whole grains. Whole grains include whole wheat pasta, brown rice, and whole grain bread. ? Eating or drinking low-fat dairy products, such as skim milk or low-fat yogurt. ? Filling one quarter of your plate at each meal with low-fat (lean) proteins. Low-fat proteins include fish, skinless chicken, eggs, beans, and tofu. ? Avoiding fatty meat, cured and processed meat, or chicken with skin. ? Avoiding premade or processed food.  Eat less than 1,500 mg of salt (sodium) a day.  Limit alcohol use to no more than 1 drink a day for nonpregnant women and 2 drinks a day for men. One drink equals 12 oz of beer, 5 oz of wine, or 1 oz of hard liquor. Lifestyle  Work with your doctor to stay at a healthy weight or to lose weight. Ask your doctor what the best weight is for you.  Get at least 30 minutes of exercise that causes your heart to beat faster (aerobic exercise) most days of the week. This may include walking, swimming, or biking.  Get at least 30 minutes of exercise that strengthens your muscles (resistance exercise) at least 3 days a week. This may include lifting weights or pilates.  Do not use any  products that contain nicotine or tobacco. This includes cigarettes and e-cigarettes. If you need help quitting, ask your doctor.  Check your blood pressure at home as told by your doctor.  Keep all follow-up visits as told by your doctor. This is important. Medicines  Take over-the-counter and prescription medicines only as told by your doctor. Follow directions carefully.  Do not skip doses of blood pressure medicine. The medicine does not work as well if you skip doses. Skipping doses also puts you at risk for problems.  Ask your doctor about side effects or reactions to medicines that you should watch for. Contact a doctor if:  You think you are having a reaction to the medicine you are taking.  You have headaches that keep coming back (recurring).  You feel dizzy.  You have swelling in your ankles.  You have trouble with your vision. Get help right away if:  You get a very bad headache.  You start to feel confused.  You feel weak or numb.  You feel faint.  You get very bad pain in your: ? Chest. ? Belly (abdomen).  You throw up (vomit) more than once.  You have trouble breathing. Summary  Hypertension is another name for high blood pressure.  Making healthy choices can help lower blood pressure. If your blood pressure cannot be controlled with healthy choices, you may need to take medicine. This information is not intended to replace advice given to you by your health care   care provider. Make sure you discuss any questions you have with your health care provider. Document Released: 12/29/2007 Document Revised: 06/09/2016 Document Reviewed: 06/09/2016 Elsevier Interactive Patient Education  2018 [employer].    Diabetes Mellitus and Nutrition When you have diabetes (diabetes mellitus), it is very important to have healthy eating habits because your blood sugar (glucose) levels are greatly affected by what you eat and drink. Eating healthy foods in the appropriate  amounts, at about the same times every day, can help you:  Control your blood glucose.  Lower your risk of heart disease.  Improve your blood pressure.  Reach or maintain a healthy weight.  Every person with diabetes is different, and each person has different needs for a meal plan. Your health care provider may recommend that you work with a diet and nutrition specialist (dietitian) to make a meal plan that is best for you. Your meal plan may vary depending on factors such as:  The calories you need.  The medicines you take.  Your weight.  Your blood glucose, blood pressure, and cholesterol levels.  Your activity level.  Other health conditions you have, such as heart or kidney disease.  How do carbohydrates affect me? Carbohydrates affect your blood glucose level more than any other type of food. Eating carbohydrates naturally increases the amount of glucose in your blood. Carbohydrate counting is a method for keeping track of how many carbohydrates you eat. Counting carbohydrates is important to keep your blood glucose at a healthy level, especially if you use insulin or take certain oral diabetes medicines. It is important to know how many carbohydrates you can safely have in each meal. This is different for every person. Your dietitian can help you calculate how many carbohydrates you should have at each meal and for snack. Foods that contain carbohydrates include:  Bread, cereal, rice, pasta, and crackers.  Potatoes and corn.  Peas, beans, and lentils.  Milk and yogurt.  Fruit and juice.  Desserts, such as cakes, cookies, ice cream, and candy.  How does alcohol affect me? Alcohol can cause a sudden decrease in blood glucose (hypoglycemia), especially if you use insulin or take certain oral diabetes medicines. Hypoglycemia can be a life-threatening condition. Symptoms of hypoglycemia (sleepiness, dizziness, and confusion) are similar to symptoms of having too much  alcohol. If your health care provider says that alcohol is safe for you, follow these guidelines:  Limit alcohol intake to no more than 1 drink per day for nonpregnant women and 2 drinks per day for men. One drink equals 12 oz of beer, 5 oz of wine, or 1 oz of hard liquor.  Do not drink on an empty stomach.  Keep yourself hydrated with water, diet soda, or unsweetened iced tea.  Keep in mind that regular soda, juice, and other mixers may contain a lot of sugar and must be counted as carbohydrates.  What are tips for following this plan? Reading food labels  Start by checking the serving size on the label. The amount of calories, carbohydrates, fats, and other nutrients listed on the label are based on one serving of the food. Many foods contain more than one serving per package.  Check the total grams (g) of carbohydrates in one serving. You can calculate the number of servings of carbohydrates in one serving by dividing the total carbohydrates by 15. For example, if a food has 30 g of total carbohydrates, it would be equal to 2 servings of carbohydrates.  Check the number  of grams (g) of saturated and trans fats in one serving. Choose foods that have low or no amount of these fats.  Check the number of milligrams (mg) of sodium in one serving. Most people should limit total sodium intake to less than 2,300 mg per day.  Always check the nutrition information of foods labeled as "low-fat" or "nonfat". These foods may be higher in added sugar or refined carbohydrates and should be avoided.  Talk to your dietitian to identify your daily goals for nutrients listed on the label. Shopping  Avoid buying canned, premade, or processed foods. These foods tend to be high in fat, sodium, and added sugar.  Shop around the outside edge of the grocery store. This includes fresh fruits and vegetables, bulk grains, fresh meats, and fresh dairy. Cooking  Use low-heat cooking methods, such as baking,  instead of high-heat cooking methods like deep frying.  Cook using healthy oils, such as olive, canola, or sunflower oil.  Avoid cooking with butter, cream, or high-fat meats. Meal planning  Eat meals and snacks regularly, preferably at the same times every day. Avoid going long periods of time without eating.  Eat foods high in fiber, such as fresh fruits, vegetables, beans, and whole grains. Talk to your dietitian about how many servings of carbohydrates you can eat at each meal.  Eat 4-6 ounces of lean protein each day, such as lean meat, chicken, fish, eggs, or tofu. 1 ounce is equal to 1 ounce of meat, chicken, or fish, 1 egg, or 1/4 cup of tofu.  Eat some foods each day that contain healthy fats, such as avocado, nuts, seeds, and fish. Lifestyle   Check your blood glucose regularly.  Exercise at least 30 minutes 5 or more days each week, or as told by your health care provider.  Take medicines as told by your health care provider.  Do not use any products that contain nicotine or tobacco, such as cigarettes and e-cigarettes. If you need help quitting, ask your health care provider.  Work with a Social worker or diabetes educator to identify strategies to manage stress and any emotional and social challenges. What are some questions to ask my health care provider?  Do I need to meet with a diabetes educator?  Do I need to meet with a dietitian?  What number can I call if I have questions?  When are the best times to check my blood glucose? Where to find more information:  American Diabetes Association: diabetes.org/food-and-fitness/food  Academy of Nutrition and Dietetics: PokerClues.dk  [employer] of Diabetes and Digestive and Kidney Diseases (NIH): ContactWire.be Summary  A healthy meal plan will help you control your blood glucose and  maintain a healthy lifestyle.  Working with a diet and nutrition specialist (dietitian) can help you make a meal plan that is best for you.  Keep in mind that carbohydrates and alcohol have immediate effects on your blood glucose levels. It is important to count carbohydrates and to use alcohol carefully. This information is not intended to replace advice given to you by your health care provider. Make sure you discuss any questions you have with your health care provider. Document Released: 04/08/2005 Document Revised: 08/16/2016 Document Reviewed: 08/16/2016 Elsevier Interactive Patient Education  [employer].

## 2017-08-13 LAB — BASIC METABOLIC PANEL
BUN / CREAT RATIO: 16 (ref 9–23)
BUN: 10 mg/dL (ref 6–24)
CO2: 24 mmol/L (ref 20–29)
CREATININE: 0.64 mg/dL (ref 0.57–1.00)
Calcium: 9.6 mg/dL (ref 8.7–10.2)
Chloride: 98 mmol/L (ref 96–106)
GFR, EST AFRICAN AMERICAN: 119 mL/min/{1.73_m2} (ref >59)
GFR, EST NON AFRICAN AMERICAN: 103 mL/min/{1.73_m2} (ref >59)
Glucose: 125 mg/dL — ABNORMAL HIGH (ref 65–99)
Potassium: 3.8 mmol/L (ref 3.5–5.2)
Sodium: 138 mmol/L (ref 134–144)

## 2017-08-17 ENCOUNTER — Telehealth: Payer: Self-pay | Admitting: Pharmacist

## 2017-08-17 NOTE — Telephone Encounter (Signed)
0/08/67 Faxed [place] application for PAP enrollment for Ventolin HFA 17mcg Inhale 1-2 puffs into the lungs every 6 hours or as needed for wheezing or shortness of breath.Delos Haring

## 2017-08-21 ENCOUNTER — Observation Stay (HOSPITAL_COMMUNITY)
Admission: EM | Admit: 2017-08-21 | Discharge: 2017-08-22 | Disposition: A | Discharge status: Home / Self Care | Payer: Self-pay | Attending: Emergency Medicine | Admitting: Emergency Medicine

## 2017-08-21 ENCOUNTER — Other Ambulatory Visit: Payer: Self-pay

## 2017-08-21 ENCOUNTER — Encounter (HOSPITAL_COMMUNITY): Payer: Self-pay | Admitting: *Deleted

## 2017-08-21 ENCOUNTER — Emergency Department (HOSPITAL_COMMUNITY): Payer: Self-pay

## 2017-08-21 DIAGNOSIS — E119 Type 2 diabetes mellitus without complications: Secondary | ICD-10-CM

## 2017-08-21 DIAGNOSIS — R079 Chest pain, unspecified: Secondary | ICD-10-CM

## 2017-08-21 DIAGNOSIS — J45909 Unspecified asthma, uncomplicated: Secondary | ICD-10-CM | POA: Diagnosis present

## 2017-08-21 DIAGNOSIS — J4520 Mild intermittent asthma, uncomplicated: Secondary | ICD-10-CM

## 2017-08-21 DIAGNOSIS — K219 Gastro-esophageal reflux disease without esophagitis: Secondary | ICD-10-CM | POA: Diagnosis present

## 2017-08-21 DIAGNOSIS — Z79899 Other long term (current) drug therapy: Secondary | ICD-10-CM | POA: Insufficient documentation

## 2017-08-21 DIAGNOSIS — E785 Hyperlipidemia, unspecified: Secondary | ICD-10-CM

## 2017-08-21 DIAGNOSIS — I10 Essential (primary) hypertension: Principal | ICD-10-CM

## 2017-08-21 DIAGNOSIS — Z7982 Long term (current) use of aspirin: Secondary | ICD-10-CM | POA: Insufficient documentation

## 2017-08-21 DIAGNOSIS — R0602 Shortness of breath: Secondary | ICD-10-CM | POA: Insufficient documentation

## 2017-08-21 DIAGNOSIS — M109 Gout, unspecified: Secondary | ICD-10-CM | POA: Diagnosis present

## 2017-08-21 LAB — BASIC METABOLIC PANEL
Anion gap: 14 (ref 5–15)
BUN: 12 mg/dL (ref 6–20)
CALCIUM: 9.5 mg/dL (ref 8.9–10.3)
CO2: 23 mmol/L (ref 22–32)
CREATININE: 0.67 mg/dL (ref 0.44–1.00)
Chloride: 102 mmol/L (ref 101–111)
Glucose, Bld: 159 mg/dL — ABNORMAL HIGH (ref 65–99)
Potassium: 3.7 mmol/L (ref 3.5–5.1)
SODIUM: 139 mmol/L (ref 135–145)

## 2017-08-21 LAB — I-STAT TROPONIN, ED
TROPONIN I, POC: 0 ng/mL (ref 0.00–0.08)
TROPONIN I, POC: 0 ng/mL (ref 0.00–0.08)

## 2017-08-21 LAB — CBC
HCT: 38.5 % (ref 36.0–46.0)
Hemoglobin: 12.9 g/dL (ref 12.0–15.0)
MCH: 29.4 pg (ref 26.0–34.0)
MCHC: 33.5 g/dL (ref 30.0–36.0)
MCV: 87.7 fL (ref 78.0–100.0)
PLATELETS: 298 10*3/uL (ref 150–400)
RBC: 4.39 MIL/uL (ref 3.87–5.11)
RDW: 14 % (ref 11.5–15.5)
WBC: 10.1 10*3/uL (ref 4.0–10.5)

## 2017-08-21 LAB — TROPONIN I: Troponin I: <0.03 ng/mL (ref <0.03)

## 2017-08-21 LAB — GLUCOSE, CAPILLARY: GLUCOSE-CAPILLARY: 146 mg/dL — AB (ref 65–99)

## 2017-08-21 LAB — BRAIN NATRIURETIC PEPTIDE: B Natriuretic Peptide: 16.7 pg/mL (ref 0.0–100.0)

## 2017-08-21 LAB — D-DIMER, QUANTITATIVE: D-Dimer, Quant: 0.63 ug/mL-FEU — ABNORMAL HIGH (ref 0.00–0.50)

## 2017-08-21 MED ORDER — RISAQUAD PO CAPS
ORAL_CAPSULE | Freq: Every day | ORAL | Status: DC
  Administered 2017-08-22: 1 via ORAL
  Filled 2017-08-21: qty 1

## 2017-08-21 MED ORDER — ROSUVASTATIN CALCIUM 10 MG PO TABS
10.0000 mg | ORAL_TABLET | Freq: Every day | ORAL | Status: DC
  Administered 2017-08-21: 10 mg via ORAL
  Filled 2017-08-21: qty 1

## 2017-08-21 MED ORDER — PREDNISONE 20 MG PO TABS
20.0000 mg | ORAL_TABLET | Freq: Every day | ORAL | Status: DC
  Administered 2017-08-22: 20 mg via ORAL
  Filled 2017-08-21: qty 1

## 2017-08-21 MED ORDER — ZOLPIDEM TARTRATE 5 MG PO TABS: 5.0000 mg | ORAL_TABLET | Freq: Every evening | ORAL | Status: DC | PRN

## 2017-08-21 MED ORDER — EPINEPHRINE 0.3 MG/0.3ML IJ SOAJ: 0.3000 mg | INTRAMUSCULAR | Status: DC | PRN

## 2017-08-21 MED ORDER — NIFEDIPINE ER 60 MG PO TB24
60.0000 mg | ORAL_TABLET | Freq: Every day | ORAL | Status: DC
  Administered 2017-08-22: 60 mg via ORAL
  Filled 2017-08-21: qty 1

## 2017-08-21 MED ORDER — FLUTICASONE PROPIONATE 50 MCG/ACT NA SUSP: 2.0000 | Freq: Every day | NASAL | Status: DC | PRN

## 2017-08-21 MED ORDER — FERROUS SULFATE 325 (65 FE) MG PO TABS
325.0000 mg | ORAL_TABLET | Freq: Every day | ORAL | Status: DC
  Administered 2017-08-22: 325 mg via ORAL
  Filled 2017-08-21 (×2): qty 1

## 2017-08-21 MED ORDER — MORPHINE SULFATE (PF) 4 MG/ML IV SOLN
4.0000 mg | Freq: Once | INTRAVENOUS | Status: AC
  Administered 2017-08-21: 4 mg via INTRAVENOUS
  Filled 2017-08-21: qty 1

## 2017-08-21 MED ORDER — HYDROMORPHONE HCL 1 MG/ML IJ SOLN
1.0000 mg | INTRAMUSCULAR | Status: DC | PRN
  Administered 2017-08-21: 1 mg via INTRAVENOUS
  Filled 2017-08-21: qty 1

## 2017-08-21 MED ORDER — HYDRALAZINE HCL 20 MG/ML IJ SOLN: 5.0000 mg | INTRAMUSCULAR | Status: DC | PRN

## 2017-08-21 MED ORDER — ASPIRIN 81 MG PO CHEW
324.0000 mg | CHEWABLE_TABLET | Freq: Once | ORAL | Status: AC
  Administered 2017-08-22: 324 mg via ORAL
  Filled 2017-08-21: qty 4

## 2017-08-21 MED ORDER — VITAMIN D3 125 MCG (5000 UT) PO CAPS: 5000.0000 [IU] | ORAL_CAPSULE | Freq: Every day | ORAL | Status: DC

## 2017-08-21 MED ORDER — ADULT MULTIVITAMIN W/MINERALS CH
1.0000 | ORAL_TABLET | Freq: Every day | ORAL | Status: DC
  Administered 2017-08-22: 1 via ORAL
  Filled 2017-08-21: qty 1

## 2017-08-21 MED ORDER — ALPRAZOLAM 0.25 MG PO TABS: 0.2500 mg | ORAL_TABLET | Freq: Two times a day (BID) | ORAL | Status: DC | PRN

## 2017-08-21 MED ORDER — MORPHINE SULFATE (PF) 4 MG/ML IV SOLN: 4.0000 mg | INTRAVENOUS | Status: DC | PRN

## 2017-08-21 MED ORDER — NITROGLYCERIN 0.4 MG SL SUBL
0.4000 mg | SUBLINGUAL_TABLET | SUBLINGUAL | Status: DC | PRN
  Administered 2017-08-21 (×2): 0.4 mg via SUBLINGUAL
  Filled 2017-08-21 (×3): qty 1

## 2017-08-21 MED ORDER — ENOXAPARIN SODIUM 40 MG/0.4ML ~~LOC~~ SOLN: 40.0000 mg | SUBCUTANEOUS | Status: DC

## 2017-08-21 MED ORDER — PNEUMOCOCCAL VAC POLYVALENT 25 MCG/0.5ML IJ INJ: 0.5000 mL | INJECTION | INTRAMUSCULAR | Status: DC

## 2017-08-21 MED ORDER — FAMOTIDINE 20 MG PO TABS
20.0000 mg | ORAL_TABLET | Freq: Two times a day (BID) | ORAL | Status: DC | PRN
  Administered 2017-08-21: 20 mg via ORAL
  Filled 2017-08-21: qty 1

## 2017-08-21 MED ORDER — VITAMIN D 1000 UNITS PO TABS
5000.0000 [IU] | ORAL_TABLET | Freq: Every day | ORAL | Status: DC
  Administered 2017-08-22: 5000 [IU] via ORAL
  Filled 2017-08-21: qty 5

## 2017-08-21 MED ORDER — ALBUTEROL SULFATE (2.5 MG/3ML) 0.083% IN NEBU: 2.5000 mg | INHALATION_SOLUTION | RESPIRATORY_TRACT | Status: DC | PRN

## 2017-08-21 MED ORDER — ACETAMINOPHEN 325 MG PO TABS: 650.0000 mg | ORAL_TABLET | Freq: Four times a day (QID) | ORAL | Status: DC | PRN

## 2017-08-21 MED ORDER — GI COCKTAIL ~~LOC~~
30.0000 mL | Freq: Once | ORAL | Status: AC
  Administered 2017-08-21: 30 mL via ORAL
  Filled 2017-08-21: qty 30

## 2017-08-21 MED ORDER — BENZONATATE 100 MG PO CAPS: 100.0000 mg | ORAL_CAPSULE | Freq: Three times a day (TID) | ORAL | Status: DC | PRN

## 2017-08-21 MED ORDER — ASPIRIN 81 MG PO CHEW
324.0000 mg | CHEWABLE_TABLET | Freq: Once | ORAL | Status: AC
  Administered 2017-08-21: 243 mg via ORAL
  Filled 2017-08-21: qty 4

## 2017-08-21 MED ORDER — ONDANSETRON HCL 4 MG/2ML IJ SOLN: 4.0000 mg | Freq: Four times a day (QID) | INTRAMUSCULAR | Status: DC | PRN

## 2017-08-21 MED ORDER — CENTRUM SILVER 50+WOMEN PO TABS: 1.0000 | ORAL_TABLET | Freq: Every day | ORAL | Status: DC

## 2017-08-21 MED ORDER — INSULIN ASPART 100 UNIT/ML ~~LOC~~ SOLN: 0.0000 [IU] | Freq: Every day | SUBCUTANEOUS | Status: DC

## 2017-08-21 MED ORDER — INSULIN ASPART 100 UNIT/ML ~~LOC~~ SOLN: 0.0000 [IU] | Freq: Three times a day (TID) | SUBCUTANEOUS | Status: DC

## 2017-08-21 MED ORDER — CYCLOBENZAPRINE HCL 10 MG PO TABS: 10.0000 mg | ORAL_TABLET | Freq: Three times a day (TID) | ORAL | Status: DC | PRN

## 2017-08-21 MED ORDER — PREDNISONE 10 MG PO TABS: 10.0000 mg | ORAL_TABLET | Freq: Every day | ORAL | Status: DC

## 2017-08-21 MED ORDER — ALLOPURINOL 300 MG PO TABS
300.0000 mg | ORAL_TABLET | Freq: Every day | ORAL | Status: DC
  Administered 2017-08-22: 300 mg via ORAL
  Filled 2017-08-21: qty 1

## 2017-08-21 MED ORDER — IOPAMIDOL (ISOVUE-370) INJECTION 76%
INTRAVENOUS | Status: AC
  Administered 2017-08-21: 100 mL via INTRAVENOUS
  Filled 2017-08-21: qty 100

## 2017-08-21 MED ORDER — FAMOTIDINE 20 MG PO TABS: 20.0000 mg | ORAL_TABLET | Freq: Two times a day (BID) | ORAL | Status: DC

## 2017-08-21 MED ORDER — FUROSEMIDE 40 MG PO TABS
40.0000 mg | ORAL_TABLET | Freq: Every day | ORAL | Status: DC
  Administered 2017-08-22: 40 mg via ORAL
  Filled 2017-08-21: qty 1

## 2017-08-21 MED ORDER — MONTELUKAST SODIUM 10 MG PO TABS
10.0000 mg | ORAL_TABLET | Freq: Every day | ORAL | Status: DC
  Administered 2017-08-21: 10 mg via ORAL
  Filled 2017-08-21: qty 1

## 2017-08-21 MED ORDER — PREDNISONE 20 MG PO TABS: 10.0000 mg | ORAL_TABLET | ORAL | Status: DC

## 2017-08-21 NOTE — ED Notes (Signed)
D-dimer added to blood in [facility] lab.

## 2017-08-21 NOTE — ED Notes (Signed)
Attempted to call report

## 2017-08-21 NOTE — ED Notes (Signed)
Patient transported to CT 

## 2017-08-21 NOTE — ED Provider Notes (Signed)
[facility] EMERGENCY DEPARTMENT Provider Note   CSN: [id number] Arrival date & time: 08/21/17  1246     History   Chief Complaint Chief Complaint  Patient presents with  . Chest Pain  . Shortness of Breath    HPI Nancy Pham is a 52 y.o. female.  HPI   Nancy Pham is a 52 y.o. female, with a history of asthma, DM, hyperlipidemia, , presenting to the ED with chest pain beginning around 8am this morning upon waking. Pain is waxing and waning, sharp/stabbing, rated 8/10, begins central chest, radiates to epigastric region and around to her left side. Pain intensifies with eating. Accompanied by minor shortness of breath. Recent illness with cough, rhinorrhea, and congestion.   Denies fever/chills, N/V/D, hematochezia, urinary complaints, acute peripheral edema, diaphoresis, dizziness, or any other complaints.    Past Medical History:  Diagnosis Date  . Asthma   . Cancer ([place])   . Diabetes mellitus   . Gout   . Hyperlipidemia   . Hypertension     Patient Active Problem List   Diagnosis Date Noted  . Chest pain 08/21/2017  . GERD (gastroesophageal reflux disease) 08/21/2017  . Gout 08/21/2017  . Hypertension   . Hyperlipidemia   . Asthma   . SOB (shortness of breath) 03/27/2017  . Anaphylaxis 03/20/2017    Past Surgical History:  Procedure Laterality Date  . ABDOMINAL HYSTERECTOMY      OB History    No data available       Home Medications    Prior to Admission medications   Medication Sig Start Date End Date Taking? Authorizing Provider  acetaminophen (TYLENOL) 325 MG tablet Take 325-650 mg by mouth every 6 (six) hours as needed for mild pain, fever or headache.    Yes [provider]  albuterol (PROVENTIL HFA;VENTOLIN HFA) 108 (90 Base) MCG/ACT inhaler Inhale 1-2 puffs into the lungs every 6 (six) hours as needed for wheezing or shortness of breath. 07/07/17  Yes Scot Jun, FNP  allopurinol (ZYLOPRIM) 300 MG  tablet Take 1 tablet (300 mg total) by mouth daily. 07/07/17  Yes Scot Jun, FNP  aspirin EC 81 MG tablet Take 1 tablet (81 mg total) by mouth daily. 05/11/17  Yes Scot Jun, FNP  benzonatate (TESSALON) 100 MG capsule Take 1-2 capsules (100-200 mg total) by mouth 3 (three) times daily as needed for cough. 08/12/17  Yes Scot Jun, FNP  Cholecalciferol (VITAMIN D3) 5000 units CAPS Take 5,000 Units by mouth daily.   Yes [provider]  cyclobenzaprine (FLEXERIL) 10 MG tablet Take 1 tablet (10 mg total) by mouth 3 (three) times daily as needed for muscle spasms. 07/07/17  Yes Scot Jun, FNP  ferrous sulfate 325 (65 FE) MG tablet Take 325 mg by mouth daily with breakfast.   Yes [provider]  fluticasone (FLONASE) 50 MCG/ACT nasal spray Place 2 sprays into both nostrils daily as needed for allergies or rhinitis.   Yes [provider]  furosemide (LASIX) 40 MG tablet Take 1 tablet (40 mg total) by mouth daily. 07/07/17  Yes Scot Jun, FNP  Liraglutide (VICTOZA) 18 MG/3ML SOLN injection Inject 1.8 mLs (10.8 mg total) into the skin daily. 03/21/17  Yes Demetrios Loll, MD  montelukast (SINGULAIR) 10 MG tablet Take 1 tablet (10 mg total) by mouth at bedtime. 07/07/17  Yes Scot Jun, FNP  Multiple Vitamins-Minerals (CENTRUM SILVER 50+WOMEN) TABS Take 1 tablet by mouth daily.  Yes [provider]  NIFEdipine (PROCARDIA-XL/ADALAT CC) 60 MG 24 hr tablet Take 1 tablet (60 mg total) by mouth daily. 07/07/17  Yes Scot Jun, FNP  potassium chloride SA (K-DUR,KLOR-CON) 20 MEQ tablet Take 1 tablet (20 mEq total) by mouth daily. 08/12/17  Yes Scot Jun, FNP  predniSONE (DELTASONE) 20 MG tablet Take 3 PO QAM x3days, 2 PO QAM x3days, 1 PO QAM x3days Patient taking differently: Take 10-30 mg by mouth See admin instructions. 30 mg once a day for 3 days then 20 mg once a day for 3 days then 10 mg once a day for 3 days  08/12/17  Yes Scot Jun, FNP  Probiotic Product (PROBIOTIC DAILY PO) Take 1 capsule by mouth daily.   Yes [provider]  rosuvastatin (CRESTOR) 10 MG tablet Take 1 tablet (10 mg total) by mouth daily. Patient taking differently: Take 10 mg by mouth at bedtime.  07/07/17  Yes Scot Jun, FNP  EPINEPHrine 0.3 mg/0.3 mL IJ SOAJ injection Inject 0.3 mLs (0.3 mg total) into the muscle as needed (acute resp failure/worsening anaphylaxis). Patient not taking: Reported on 08/21/2017 05/11/17   Scot Jun, FNP  famotidine (PEPCID) 20 MG tablet Take 1 tablet (20 mg total) by mouth 2 (two) times daily. Patient not taking: Reported on 08/21/2017 03/21/17   Demetrios Loll, MD    Family History Family History  Family history unknown: Yes    Social History Social History   Tobacco Use  . Smoking status: Never Smoker  . Smokeless tobacco: Never Used  Substance Use Topics  . Alcohol use: No  . Drug use: No     Allergies   Ace inhibitors and Tape   Review of Systems Review of Systems  Constitutional: Negative for chills, diaphoresis and fever.  Respiratory: Positive for shortness of breath. Negative for cough.   Cardiovascular: Positive for chest pain. Negative for leg swelling.  Gastrointestinal: Negative for diarrhea, nausea and vomiting.  Genitourinary: Negative for dysuria and hematuria.  All other systems reviewed and are negative.    Physical Exam Updated Vital Signs BP (!) 176/112 (BP Location: Right Arm)   Pulse 93   Temp 98.2 F (36.8 C) (Oral)   Resp 18   SpO2 97%   Physical Exam  Constitutional: She appears well-developed and well-nourished. No distress.  HENT:  Head: Normocephalic and atraumatic.  Eyes: Conjunctivae are normal.  Neck: Neck supple.  Cardiovascular: Normal rate, regular rhythm, normal heart sounds and intact distal pulses.  Pulmonary/Chest: Effort normal and breath sounds normal. No respiratory distress. She exhibits  tenderness.  No increased work of breathing. Speaks in full sentences without difficulty.   Abdominal: Soft. There is no tenderness. There is no guarding.  Musculoskeletal: She exhibits no edema.  Lymphadenopathy:    She has no cervical adenopathy.  Neurological: She is alert.  Skin: Skin is warm and dry. She is not diaphoretic.  Psychiatric: She has a normal mood and affect. Her behavior is normal.  Nursing note and vitals reviewed.    ED Treatments / Results  Labs (all labs ordered are listed, but only abnormal results are displayed) Labs Reviewed  BASIC METABOLIC PANEL - Abnormal; Notable for the following components:      Result Value   Glucose, Bld 159 (*)    All other components within normal limits  D-DIMER, QUANTITATIVE (NOT AT [facility]) - Abnormal; Notable for the following components:   D-Dimer, Quant 0.63 (*)    All other  components within normal limits  CBC  I-STAT TROPONIN, ED  I-STAT TROPONIN, ED    EKG  EKG Interpretation  Date/Time:  Sunday August 21 2017 16:17:20 EST Ventricular Rate:  88 PR Interval:    QRS Duration: 89 QT Interval:  337 QTC Calculation: 408 R Axis:   -8 Text Interpretation:  Sinus rhythm Borderline T wave abnormalities When compared to prior, t waves are now upright in leads AVF, V4-V6.  No STEMI Reconfirmed by Antony Blackbird [phone]) on 08/21/2017 4:31:57 PM       Radiology Dg Chest 2 View  Result Date: 08/21/2017 CLINICAL DATA:  Midline chest pain radiates under left breast. EXAM: CHEST  2 VIEW COMPARISON:  03/27/2017 FINDINGS: The lungs are clear without focal pneumonia, edema, pneumothorax or pleural effusion. The cardiopericardial silhouette is within normal limits for size. The visualized bony structures of the thorax are intact. IMPRESSION: No active cardiopulmonary disease. Electronically Signed   By: Misty Stanley M.D.   On: 08/21/2017 13:33   Ct Angio Chest Pe W And/or Wo Contrast  Result Date: 08/21/2017 CLINICAL DATA:   Patient with centralized chest pain radiating to the back. Elevated D-dimer. Evaluate for pulmonary embolus. EXAM: CT ANGIOGRAPHY CHEST WITH CONTRAST TECHNIQUE: Multidetector CT imaging of the chest was performed using the standard protocol during bolus administration of intravenous contrast. Multiplanar CT image reconstructions and MIPs were obtained to evaluate the vascular anatomy. CONTRAST:  162mL ISOVUE-370 IOPAMIDOL (ISOVUE-370) INJECTION 76% COMPARISON:  Chest radiograph 08/21/2017; chest radiograph 03/27/2017 FINDINGS: Cardiovascular: Normal heart size. No pericardial effusion. Normal caliber thoracic aorta. Adequate opacification of the main pulmonary artery. No evidence for pulmonary embolus. Mediastinum/Nodes: No enlarged axillary, mediastinal or hilar lymphadenopathy. Small hiatal hernia. Lungs/Pleura: Central airways are patent. Dependent atelectasis within the bilateral lower lobes. No pleural effusion. Upper Abdomen: No acute process. Musculoskeletal: Thoracic spine degenerative changes. No aggressive or acute appearing osseous lesions. Review of the MIP images confirms the above findings. IMPRESSION: 1. No acute pulmonary embolus. 2. No acute process in the chest. Electronically Signed   By: Lovey Newcomer M.D.   On: 08/21/2017 18:17    Procedures Procedures (including critical care time)  Medications Ordered in ED Medications  nitroGLYCERIN (NITROSTAT) SL tablet 0.4 mg (0.4 mg Sublingual Given 08/21/17 1909)  aspirin chewable tablet 324 mg (243 mg Oral Given 08/21/17 1620)  gi cocktail (Maalox,Lidocaine,Donnatal) (30 mLs Oral Given 08/21/17 1629)  morphine 4 MG/ML injection 4 mg (4 mg Intravenous Given 08/21/17 1651)  iopamidol (ISOVUE-370) 76 % injection (100 mLs Intravenous Contrast Given 08/21/17 1744)     Initial Impression / Assessment and Plan / ED Course  I have reviewed the triage vital signs and the nursing notes.  Pertinent labs & imaging results that were available during my  care of the patient were reviewed by me and considered in my medical decision making (see chart for details).  Clinical Course as of Aug 21 1910  Nancy Fetter Aug 21, 2017  1640 Patient reevaluated.  Discussed positive d-dimer.  Patient voices minimal improvement in symptoms with GI cocktail.  [SJ]  0762 Discussed CT results with patient. States her pain is now "manageable," rates it 6/10.   [SJ]  1908 Spoke with Dr. Blaine Hamper, hospitalist. Agrees to admit the patient.   [SJ]    Clinical Course User Index [SJ] Min Collymore C, PA-C    Patient presents with chest pain accompanied by some shortness of breath. HEART score is 4, indicating moderate risk for a cardiac event. Wells criteria score  is 3, indicating moderate risk for PE.  Initial troponin negative.  Positive d-dimer, however, no PE noted on CT. Admit for chest pain rule out.  Findings and plan of care discussed with Antony Blackbird, MD.   Vitals:   08/21/17 1301 08/21/17 1717 08/21/17 1848 08/21/17 1900  BP: (!) 176/112 (!) 170/104 (!) 148/81 (!) 147/87  Pulse: 93 (!) 104 99 91  Resp: 18 (!) 22 (!) 22 17  Temp: 98.2 F (36.8 C)     TempSrc: Oral     SpO2: 97% 100% 96% 95%     Final Clinical Impressions(s) / ED Diagnoses   Final diagnoses:  Chest pain, unspecified type    ED Discharge Orders    None       Layla Maw 08/21/17 1912    Tegeler, Gwenyth Allegra, MD 08/21/17 [phone]

## 2017-08-21 NOTE — ED Triage Notes (Signed)
Pt reports waking up this am with mid stabbing chest pain that radiates down into epigastric and around her left side. Has some sob. Pain gets worse when eating. Airway intact at triage.

## 2017-08-21 NOTE — H&P (Signed)
History and Physical    [name] OMV:[id number] DOB: 04-27-1966 DOA: 08/21/2017  Referring MD/NP/PA:   PCP: Scot Jun, FNP   Patient coming from:  The patient is coming from home.  At baseline, pt is independent for most of ADL.    Chief Complaint: chest pain  HPI: Nancy Pham is a 52 y.o. female with medical history significant of hypertension, hyperlipidemia, diabetes mellitus, asthma, GERD, gout, Ovarian/cervical cancer (s/p of hysterectomy and chemotherapy 2004, in remission), obesity, who presents with chest pain.  Patient states that her chest pain started this morning. It is located in the substernal area, initially 10 out of 10 in severity, currently 6 out of 10 in severity, pressure-like, radiating to the left lower chest, aggravated by eating food. It is associated with mild shortness of breath. Patient has dry cough, but no fever or chills. No tenderness in the calf areas. Patient denies nausea, vomiting, diarrhea, abdominal pain, symptoms of UTI or unilateral weakness. Patient states that she is on tapering dose of prednisone for joint swelling since 08/12/17 (possibly due to gout).  ED Course: pt was found to have negative troponin, WBC 10.1, positive d-dimer 0.63, electrolytes renal function okay, temperature normal, tachycardia, tachypnea, oxygen saturation 97% on room air, negative chest x-ray. CT angiograms negative for PE. Patient is placed on telemetry bed for observation.  Review of Systems:   General: no fevers, chills, no body weight gain, has poor appetite, has fatigue HEENT: no blurry vision, hearing changes or sore throat Respiratory: has dyspnea, coughing, no wheezing CV: has chest pain, no palpitations GI: no nausea, vomiting, abdominal pain, diarrhea, constipation GU: no dysuria, burning on urination, increased urinary frequency, hematuria  Ext: no leg edema Neuro: no unilateral weakness, numbness, or tingling, no vision change or hearing  loss Skin: no rash, no skin tear. MSK: No muscle spasm, no deformity, no limitation of range of movement in spin Heme: No easy bruising.  Travel history: No recent long distant travel.  Allergy:  Allergies  Allergen Reactions  . Ace Inhibitors Swelling    Swelling of the lips  . Tape Rash    No Band-Aids!!    Past Medical History:  Diagnosis Date  . Asthma   . Cancer ([place])   . Diabetes mellitus   . Gout   . Hyperlipidemia   . Hypertension     Past Surgical History:  Procedure Laterality Date  . ABDOMINAL HYSTERECTOMY      Social History:  reports that  has never smoked. she has never used smokeless tobacco. She reports that she does not drink alcohol or use drugs.  Family History:  Family History  Problem Relation Age of Onset  . Seizures Mother   . Heart attack Father   . Heart attack Sister      Prior to Admission medications   Medication Sig Start Date End Date Taking? Authorizing Provider  acetaminophen (TYLENOL) 325 MG tablet Take 325-650 mg by mouth every 6 (six) hours as needed for mild pain, fever or headache.    Yes [provider]  albuterol (PROVENTIL HFA;VENTOLIN HFA) 108 (90 Base) MCG/ACT inhaler Inhale 1-2 puffs into the lungs every 6 (six) hours as needed for wheezing or shortness of breath. 07/07/17  Yes Scot Jun, FNP  allopurinol (ZYLOPRIM) 300 MG tablet Take 1 tablet (300 mg total) by mouth daily. 07/07/17  Yes Scot Jun, FNP  aspirin EC 81 MG tablet Take 1 tablet (81 mg total) by mouth daily. 05/11/17  Yes Scot Jun, FNP  benzonatate (TESSALON) 100 MG capsule Take 1-2 capsules (100-200 mg total) by mouth 3 (three) times daily as needed for cough. 08/12/17  Yes Scot Jun, FNP  Cholecalciferol (VITAMIN D3) 5000 units CAPS Take 5,000 Units by mouth daily.   Yes [provider]  cyclobenzaprine (FLEXERIL) 10 MG tablet Take 1 tablet (10 mg total) by mouth 3 (three) times daily as needed for muscle  spasms. 07/07/17  Yes Scot Jun, FNP  ferrous sulfate 325 (65 FE) MG tablet Take 325 mg by mouth daily with breakfast.   Yes [provider]  fluticasone (FLONASE) 50 MCG/ACT nasal spray Place 2 sprays into both nostrils daily as needed for allergies or rhinitis.   Yes [provider]  furosemide (LASIX) 40 MG tablet Take 1 tablet (40 mg total) by mouth daily. 07/07/17  Yes Scot Jun, FNP  Liraglutide (VICTOZA) 18 MG/3ML SOLN injection Inject 1.8 mLs (10.8 mg total) into the skin daily. 03/21/17  Yes Demetrios Loll, MD  montelukast (SINGULAIR) 10 MG tablet Take 1 tablet (10 mg total) by mouth at bedtime. 07/07/17  Yes Scot Jun, FNP  Multiple Vitamins-Minerals (CENTRUM SILVER 50+WOMEN) TABS Take 1 tablet by mouth daily.   Yes [provider]  NIFEdipine (PROCARDIA-XL/ADALAT CC) 60 MG 24 hr tablet Take 1 tablet (60 mg total) by mouth daily. 07/07/17  Yes Scot Jun, FNP  potassium chloride SA (K-DUR,KLOR-CON) 20 MEQ tablet Take 1 tablet (20 mEq total) by mouth daily. 08/12/17  Yes Scot Jun, FNP  predniSONE (DELTASONE) 20 MG tablet Take 3 PO QAM x3days, 2 PO QAM x3days, 1 PO QAM x3days Patient taking differently: Take 10-30 mg by mouth See admin instructions. 30 mg once a day for 3 days then 20 mg once a day for 3 days then 10 mg once a day for 3 days 08/12/17  Yes Scot Jun, FNP  Probiotic Product (PROBIOTIC DAILY PO) Take 1 capsule by mouth daily.   Yes [provider]  rosuvastatin (CRESTOR) 10 MG tablet Take 1 tablet (10 mg total) by mouth daily. Patient taking differently: Take 10 mg by mouth at bedtime.  07/07/17  Yes Scot Jun, FNP  EPINEPHrine 0.3 mg/0.3 mL IJ SOAJ injection Inject 0.3 mLs (0.3 mg total) into the muscle as needed (acute resp failure/worsening anaphylaxis). Patient not taking: Reported on 08/21/2017 05/11/17   Scot Jun, FNP  famotidine (PEPCID) 20 MG tablet Take 1 tablet (20 mg  total) by mouth 2 (two) times daily. Patient not taking: Reported on 08/21/2017 03/21/17   Demetrios Loll, MD    Physical Exam: Vitals:   08/21/17 1717 08/21/17 1848 08/21/17 1900 08/21/17 1906  BP: (!) 170/104 (!) 148/81 (!) 147/87 127/70  Pulse: (!) 104 99 91 98  Resp: (!) 22 (!) 22 17 (!) 23  Temp:      TempSrc:      SpO2: 100% 96% 95% 97%   General: Not in acute distress HEENT:       Eyes: PERRL, EOMI, no scleral icterus.       ENT: No discharge from the ears and nose, no pharynx injection, no tonsillar enlargement.        Neck: No JVD, no bruit, no mass felt. Heme: No neck lymph node enlargement. Cardiac: S1/S2, RRR, No murmurs, No gallops or rubs. Respiratory: No rales, wheezing, rhonchi or rubs. GI: Soft, nondistended, nontender, no rebound pain, no organomegaly, BS present. GU: No hematuria Ext:  No pitting leg edema bilaterally. 2+DP/PT pulse bilaterally. Musculoskeletal: No joint deformities, No joint redness or warmth, no limitation of ROM in spin. Skin: No rashes.  Neuro: Alert, oriented X3, cranial nerves II-XII grossly intact, moves all extremities normally. Psych: Patient is not psychotic, no suicidal or hemocidal ideation.  Labs on Admission: I have personally reviewed following labs and imaging studies  CBC: Recent Labs  Lab 08/21/17 1312  WBC 10.1  HGB 12.9  HCT 38.5  MCV 87.7  PLT 191   Basic Metabolic Panel: Recent Labs  Lab 08/21/17 1312  NA 139  K 3.7  CL 102  CO2 23  GLUCOSE 159*  BUN 12  CREATININE 0.67  CALCIUM 9.5   GFR: Estimated Creatinine Clearance: 110.4 mL/min (by C-G formula based on SCr of 0.67 mg/dL). Liver Function Tests: No results for input(s): AST, ALT, ALKPHOS, BILITOT, PROT, ALBUMIN in the last 168 hours. No results for input(s): LIPASE, AMYLASE in the last 168 hours. No results for input(s): AMMONIA in the last 168 hours. Coagulation Profile: No results for input(s): INR, PROTIME in the last 168 hours. Cardiac  Enzymes: No results for input(s): CKTOTAL, CKMB, CKMBINDEX, TROPONINI in the last 168 hours. BNP (last 3 results) No results for input(s): PROBNP in the last 8760 hours. HbA1C: No results for input(s): HGBA1C in the last 72 hours. CBG: No results for input(s): GLUCAP in the last 168 hours. Lipid Profile: No results for input(s): CHOL, HDL, LDLCALC, TRIG, CHOLHDL, LDLDIRECT in the last 72 hours. Thyroid Function Tests: No results for input(s): TSH, T4TOTAL, FREET4, T3FREE, THYROIDAB in the last 72 hours. Anemia Panel: No results for input(s): VITAMINB12, FOLATE, FERRITIN, TIBC, IRON, RETICCTPCT in the last 72 hours. Urine analysis:    Component Value Date/Time   COLORURINE YELLOW 09/26/2011 1603   APPEARANCEUR CLEAR 09/26/2011 1603   LABSPEC >=1.030 08/12/2017 1507   PHURINE 5.5 08/12/2017 1507   GLUCOSEU NEGATIVE 08/12/2017 1507   HGBUR NEGATIVE 08/12/2017 1507   BILIRUBINUR NEGATIVE 08/12/2017 1507   KETONESUR NEGATIVE 08/12/2017 1507   PROTEINUR 30 (A) 08/12/2017 1507   UROBILINOGEN 0.2 08/12/2017 1507   NITRITE NEGATIVE 08/12/2017 1507   LEUKOCYTESUR NEGATIVE 08/12/2017 1507   Sepsis Labs: @LABRCNTIP (procalcitonin:4,lacticidven:4) )No results found for this or any previous visit (from the past 240 hour(s)).   Radiological Exams on Admission: Dg Chest 2 View  Result Date: 08/21/2017 CLINICAL DATA:  Midline chest pain radiates under left breast. EXAM: CHEST  2 VIEW COMPARISON:  03/27/2017 FINDINGS: The lungs are clear without focal pneumonia, edema, pneumothorax or pleural effusion. The cardiopericardial silhouette is within normal limits for size. The visualized bony structures of the thorax are intact. IMPRESSION: No active cardiopulmonary disease. Electronically Signed   By: Misty Stanley M.D.   On: 08/21/2017 13:33   Ct Angio Chest Pe W And/or Wo Contrast  Result Date: 08/21/2017 CLINICAL DATA:  Patient with centralized chest pain radiating to the back. Elevated D-dimer.  Evaluate for pulmonary embolus. EXAM: CT ANGIOGRAPHY CHEST WITH CONTRAST TECHNIQUE: Multidetector CT imaging of the chest was performed using the standard protocol during bolus administration of intravenous contrast. Multiplanar CT image reconstructions and MIPs were obtained to evaluate the vascular anatomy. CONTRAST:  12mL ISOVUE-370 IOPAMIDOL (ISOVUE-370) INJECTION 76% COMPARISON:  Chest radiograph 08/21/2017; chest radiograph 03/27/2017 FINDINGS: Cardiovascular: Normal heart size. No pericardial effusion. Normal caliber thoracic aorta. Adequate opacification of the main pulmonary artery. No evidence for pulmonary embolus. Mediastinum/Nodes: No enlarged axillary, mediastinal or hilar lymphadenopathy. Small hiatal hernia. Lungs/Pleura: Central airways  are patent. Dependent atelectasis within the bilateral lower lobes. No pleural effusion. Upper Abdomen: No acute process. Musculoskeletal: Thoracic spine degenerative changes. No aggressive or acute appearing osseous lesions. Review of the MIP images confirms the above findings. IMPRESSION: 1. No acute pulmonary embolus. 2. No acute process in the chest. Electronically Signed   By: Lovey Newcomer M.D.   On: 08/21/2017 18:17     EKG: Independently reviewed. Sinus rhythm, QTC 408, LAD, poor R-wave progression, biphasic T-wave in lead III/aVF   Assessment/Plan Principal Problem:   Chest pain Active Problems:   Hypertension   Hyperlipidemia   Asthma   GERD (gastroesophageal reflux disease)   Gout   Diabetes mellitus without complication (HCC)   Chest pain: Patient has atypical chest pain. Chest x-ray negative. D-dimer positive, but CT angiograms negative for PE. Patient has significant risk factors including hypertension, hyperlipidemia and diabetes mellitus. Will do chest pain throughout workup.  - will place on Tele bed for obs - cycle CE q6 x3 and repeat EKG in the am  - prn Nitroglycerin, Morphine, and aspirin, crestor - Risk factor  stratification: will check FLP, UDS and A1C  - 2d echo - LE doppler to r/o DVT due to positive D-dimer - Inpatient non-urgent card consult order was put in Epic and message to Birdie [employer] was sent out.  Diabetes mellitus without complication ([place]): Last A1c 6.4 on 06/24/17, well controled. Patient is taking victoza at home -SSI -Check A1c  HTN:  -Continue home medications: Nifedipine, Lasix -IV hydralazine prn  HLD: -Crestor  GERD: -Pepcid prn  Asthma: stable -prn albuterol nebs -Continue Singulair  Gout: -continue home allopurinol -on tapering prednisone sine 08/12/17  DVT ppx: SQ Lovenox Code Status: Full code Family Communication: Yes, patient's  son  at bed side Disposition Plan:  Anticipate discharge back to previous home environment Consults called:  none Admission status: Obs / tele     Date of Service 08/21/2017    Ivor Costa [facility] Pager [phone]  If 7PM-7AM, please contact night-coverage [email or web address] Password [facility] 08/21/2017, 7:46 PM

## 2017-08-22 ENCOUNTER — Encounter (HOSPITAL_COMMUNITY): Payer: Self-pay | Admitting: Cardiology

## 2017-08-22 ENCOUNTER — Observation Stay (HOSPITAL_BASED_OUTPATIENT_CLINIC_OR_DEPARTMENT_OTHER): Payer: Self-pay

## 2017-08-22 ENCOUNTER — Observation Stay (HOSPITAL_COMMUNITY): Payer: Self-pay

## 2017-08-22 DIAGNOSIS — R072 Precordial pain: Secondary | ICD-10-CM

## 2017-08-22 DIAGNOSIS — E119 Type 2 diabetes mellitus without complications: Secondary | ICD-10-CM

## 2017-08-22 DIAGNOSIS — M7989 Other specified soft tissue disorders: Secondary | ICD-10-CM

## 2017-08-22 DIAGNOSIS — R079 Chest pain, unspecified: Secondary | ICD-10-CM

## 2017-08-22 LAB — HEMOGLOBIN A1C
HEMOGLOBIN A1C: 6.7 % — AB (ref 4.8–5.6)
Mean Plasma Glucose: 145.59 mg/dL

## 2017-08-22 LAB — EXERCISE TOLERANCE TEST
CHL CUP RESTING HR STRESS: 114 {beats}/min
CSEPHR: 86 %
Exercise duration (min): 2 min
Exercise duration (sec): 58 s
Peak HR: 146 {beats}/min

## 2017-08-22 LAB — LIPID PANEL
Cholesterol: 167 mg/dL (ref 0–200)
HDL: 58 mg/dL (ref >40)
LDL CALC: 39 mg/dL (ref 0–99)
Total CHOL/HDL Ratio: 2.9 RATIO
Triglycerides: 350 mg/dL — ABNORMAL HIGH (ref <150)
VLDL: 70 mg/dL — AB (ref 0–40)

## 2017-08-22 LAB — RAPID URINE DRUG SCREEN, HOSP PERFORMED
AMPHETAMINES: NOT DETECTED
BARBITURATES: NOT DETECTED
BENZODIAZEPINES: NOT DETECTED
COCAINE: NOT DETECTED
Opiates: POSITIVE — AB
TETRAHYDROCANNABINOL: NOT DETECTED

## 2017-08-22 LAB — GLUCOSE, CAPILLARY
GLUCOSE-CAPILLARY: 97 mg/dL (ref 65–99)
GLUCOSE-CAPILLARY: 116 mg/dL — AB (ref 65–99)

## 2017-08-22 LAB — TROPONIN I: Troponin I: <0.03 ng/mL (ref <0.03)

## 2017-08-22 MED ORDER — FAMOTIDINE 20 MG PO TABS: 20.0000 mg | ORAL_TABLET | Freq: Two times a day (BID) | ORAL | 0 refills | Status: DC

## 2017-08-22 MED ORDER — PANTOPRAZOLE SODIUM 40 MG PO TBEC
40.0000 mg | DELAYED_RELEASE_TABLET | Freq: Every day | ORAL | Status: DC
  Administered 2017-08-22: 40 mg via ORAL

## 2017-08-22 NOTE — Progress Notes (Signed)
Patient given discharge instructions. Had some questions, spoke to Dr. Eliseo Squires. Verbalized understanding and said she had no further concerns. Educated on new medication, Famotidine. Rx given. Peripheral IV removed. Pt waiting on ride.

## 2017-08-22 NOTE — Progress Notes (Signed)
Nutrition Brief Note  Patient identified on the Malnutrition Screening Tool (MST) Report  Wt Readings from Last 15 Encounters:  08/22/17 275 lb 2.2 oz (124.8 kg)  08/12/17 280 lb (127 kg)  06/24/17 286 lb (129.7 kg)  06/13/17 287 lb (130.2 kg)  05/11/17 289 lb (131.1 kg)  03/28/17 286 lb (129.7 kg)  03/20/17 290 lb 3.2 oz (131.6 kg)    Body mass index is 45.79 kg/m. Patient meets criteria for Obesity Class III based on current BMI.   Current diet order is Heart Healthy/Carb Modified. No meal completion recorded at this time as pt's diet was just advanced At time of visit, pt reported good appetite and stated she was awaiting lunch. Labs and medications reviewed.   No nutrition interventions warranted at this time. Pt has experienced weight loss over the past 5 months, but percent weight loss is not significant for timeframe. No fat or muscle depletion identified during NFPE. If nutrition issues arise, please consult RD.   Gaynell Face, MS, RD, LDN Pager: [phone] Weekend/After Hours: [phone]

## 2017-08-22 NOTE — Discharge Summary (Signed)
Physician Discharge Summary  Nancy Pham TKW:[id number] DOB: [birth year] DOA: 08/21/2017  PCP: Scot Jun, FNP  Admit date: 08/21/2017 Discharge date: 08/22/2017   Recommendations for Outpatient Follow-Up:   Weight loss PPI/GERD treatment Increase exercise  Discharge Diagnosis:   Principal Problem:   Chest pain Active Problems:   Hypertension   Hyperlipidemia   Asthma   GERD (gastroesophageal reflux disease)   Gout   Diabetes mellitus without complication [facility])   Discharge disposition:  Home  Discharge Condition: Improved.  Diet recommendation: Low sodium, heart healthy.  Carbohydrate-modified  Wound care: None.   History of Present Illness:   Nancy Pham is a 52 y.o. female with medical history significant of hypertension, hyperlipidemia, diabetes mellitus, asthma, GERD, gout, Ovarian/cervical cancer (s/p of hysterectomy and chemotherapy 2004, in remission), obesity, who presents with chest pain.  Patient states that her chest pain started this morning. It is located in the substernal area, initially 10 out of 10 in severity, currently 6 out of 10 in severity, pressure-like, radiating to the left lower chest, aggravated by eating food. It is associated with mild shortness of breath. Patient has dry cough, but no fever or chills. No tenderness in the calf areas. Patient denies nausea, vomiting, diarrhea, abdominal pain, symptoms of UTI or unilateral weakness. Patient states that she is on tapering dose of prednisone for joint swelling since 08/12/17 (possibly due to gout).  ED Course: pt was found to have negative troponin, WBC 10.1, positive d-dimer 0.63, electrolytes renal function okay, temperature normal, tachycardia, tachypnea, oxygen saturation 97% on room air, negative chest x-ray. CT angiograms negative for PE. Patient is placed on telemetry bed for observation.     Hospital Course by Problem:   Chest pain -Atypical constant chest pressure now  present for 24 hrs. No associated symptoms -Normal echo in 03/2017.  -Troponins negative X 4 -EKG with non-specific ST changes, similar to previous EKGs and did not evolve on serial EKGs. -Chest xray negative -D-dimer elevated at 0.63 however CTA of the chest showed no evidence of PE -duplexes also negative -stress test done-- treadmill- discussed with cards, ok to go home  Hypertension -continue Home meds  lasix 40 mg daily, Nifedipine 60 mg daily  Hyperlipidemia -Crestor 10 mg daily  Diabetes type 2 not on insulin -Well controlled with A1c 6.7 earlier this month  GERD -schedule PPI instead of PRN  Morbid Obesity Body mass index is 45.79 kg/m.  Medical Consultants:    cards   Discharge Exam:   Vitals:   08/22/17 1144 08/22/17 1150  BP: 111/71   Pulse: 79 81  Resp:    Temp: 98.4 F (36.9 C)   SpO2: 99% 99%   Vitals:   08/22/17 0429 08/22/17 0724 08/22/17 1144 08/22/17 1150  BP: 138/72 128/61 111/71   Pulse: 82 78 79 81  Resp: 18     Temp: 98.6 F (37 C) 98.8 F (37.1 C) 98.4 F (36.9 C)   TempSrc: Oral Oral Oral   SpO2: 97% 98% 99% 99%  Weight:      Height:        Gen:  NAD    The results of significant diagnostics from this hospitalization (including imaging, microbiology, ancillary and laboratory) are listed below for reference.     Procedures and Diagnostic Studies:   Dg Chest 2 View  Result Date: 08/21/2017 CLINICAL DATA:  Midline chest pain radiates under left breast. EXAM: CHEST  2 VIEW COMPARISON:  03/27/2017 FINDINGS: The lungs are clear without  focal pneumonia, edema, pneumothorax or pleural effusion. The cardiopericardial silhouette is within normal limits for size. The visualized bony structures of the thorax are intact. IMPRESSION: No active cardiopulmonary disease. Electronically Signed   By: Misty Stanley M.D.   On: 08/21/2017 13:33   Ct Angio Chest Pe W And/or Wo Contrast  Result Date: 08/21/2017 CLINICAL DATA:  Patient with  centralized chest pain radiating to the back. Elevated D-dimer. Evaluate for pulmonary embolus. EXAM: CT ANGIOGRAPHY CHEST WITH CONTRAST TECHNIQUE: Multidetector CT imaging of the chest was performed using the standard protocol during bolus administration of intravenous contrast. Multiplanar CT image reconstructions and MIPs were obtained to evaluate the vascular anatomy. CONTRAST:  167mL ISOVUE-370 IOPAMIDOL (ISOVUE-370) INJECTION 76% COMPARISON:  Chest radiograph 08/21/2017; chest radiograph 03/27/2017 FINDINGS: Cardiovascular: Normal heart size. No pericardial effusion. Normal caliber thoracic aorta. Adequate opacification of the main pulmonary artery. No evidence for pulmonary embolus. Mediastinum/Nodes: No enlarged axillary, mediastinal or hilar lymphadenopathy. Small hiatal hernia. Lungs/Pleura: Central airways are patent. Dependent atelectasis within the bilateral lower lobes. No pleural effusion. Upper Abdomen: No acute process. Musculoskeletal: Thoracic spine degenerative changes. No aggressive or acute appearing osseous lesions. Review of the MIP images confirms the above findings. IMPRESSION: 1. No acute pulmonary embolus. 2. No acute process in the chest. Electronically Signed   By: Lovey Newcomer M.D.   On: 08/21/2017 18:17     Labs:   Basic Metabolic Panel: Recent Labs  Lab 08/21/17 1312  NA 139  K 3.7  CL 102  CO2 23  GLUCOSE 159*  BUN 12  CREATININE 0.67  CALCIUM 9.5   GFR Estimated Creatinine Clearance: 109.2 mL/min (by C-G formula based on SCr of 0.67 mg/dL). Liver Function Tests: No results for input(s): AST, ALT, ALKPHOS, BILITOT, PROT, ALBUMIN in the last 168 hours. No results for input(s): LIPASE, AMYLASE in the last 168 hours. No results for input(s): AMMONIA in the last 168 hours. Coagulation profile No results for input(s): INR, PROTIME in the last 168 hours.  CBC: Recent Labs  Lab 08/21/17 1312  WBC 10.1  HGB 12.9  HCT 38.5  MCV 87.7  PLT 298   Cardiac  Enzymes: Recent Labs  Lab 08/21/17 1912 08/22/17 0130 08/22/17 0543  TROPONINI <0.03 <0.03 <0.03   BNP: Invalid input(s): POCBNP CBG: Recent Labs  Lab 08/21/17 2139 08/22/17 0721 08/22/17 1143  GLUCAP 146* 97 116*   D-Dimer Recent Labs    08/21/17 1312  DDIMER 0.63*   Hgb A1c Recent Labs    08/22/17 0130  HGBA1C 6.7*   Lipid Profile Recent Labs    08/22/17 0543  CHOL 167  HDL 58  LDLCALC 39  TRIG 350*  CHOLHDL 2.9   Thyroid function studies No results for input(s): TSH, T4TOTAL, T3FREE, THYROIDAB in the last 72 hours.  Invalid input(s): FREET3 Anemia work up No results for input(s): VITAMINB12, FOLATE, FERRITIN, TIBC, IRON, RETICCTPCT in the last 72 hours. Microbiology No results found for this or any previous visit (from the past 240 hour(s)).   Discharge Instructions:   Discharge Instructions    Diet - low sodium heart healthy   Complete by:  As directed    Diet Carb Modified   Complete by:  As directed    Discharge instructions   Complete by:  As directed    Be sure to take heart burn medications-- especially when taking prednisone   Increase activity slowly   Complete by:  As directed      Allergies as of 08/22/2017  Reactions   Ace Inhibitors Swelling   Swelling of the lips   Tape Rash   No Band-Aids!!      Medication List    TAKE these medications   acetaminophen 325 MG tablet Commonly known as:  TYLENOL Take 325-650 mg by mouth every 6 (six) hours as needed for mild pain, fever or headache.   albuterol 108 (90 Base) MCG/ACT inhaler Commonly known as:  PROVENTIL HFA;VENTOLIN HFA Inhale 1-2 puffs into the lungs every 6 (six) hours as needed for wheezing or shortness of breath.   allopurinol 300 MG tablet Commonly known as:  ZYLOPRIM Take 1 tablet (300 mg total) by mouth daily.   aspirin EC 81 MG tablet Take 1 tablet (81 mg total) by mouth daily.   benzonatate 100 MG capsule Commonly known as:  TESSALON Take 1-2  capsules (100-200 mg total) by mouth 3 (three) times daily as needed for cough.   CENTRUM SILVER 50+WOMEN Tabs Take 1 tablet by mouth daily.   cyclobenzaprine 10 MG tablet Commonly known as:  FLEXERIL Take 1 tablet (10 mg total) by mouth 3 (three) times daily as needed for muscle spasms.   EPINEPHrine 0.3 mg/0.3 mL Soaj injection Commonly known as:  EPI-PEN Inject 0.3 mLs (0.3 mg total) into the muscle as needed (acute resp failure/worsening anaphylaxis).   famotidine 20 MG tablet Commonly known as:  PEPCID Take 1 tablet (20 mg total) by mouth 2 (two) times daily.   ferrous sulfate 325 (65 FE) MG tablet Take 325 mg by mouth daily with breakfast.   FLONASE 50 MCG/ACT nasal spray Generic drug:  fluticasone Place 2 sprays into both nostrils daily as needed for allergies or rhinitis.   furosemide 40 MG tablet Commonly known as:  LASIX Take 1 tablet (40 mg total) by mouth daily.   Liraglutide 18 MG/3ML Soln injection Commonly known as:  VICTOZA Inject 1.8 mLs (10.8 mg total) into the skin daily.   montelukast 10 MG tablet Commonly known as:  SINGULAIR Take 1 tablet (10 mg total) by mouth at bedtime.   NIFEdipine 60 MG 24 hr tablet Commonly known as:  PROCARDIA-XL/ADALAT CC Take 1 tablet (60 mg total) by mouth daily.   potassium chloride SA 20 MEQ tablet Commonly known as:  K-DUR,KLOR-CON Take 1 tablet (20 mEq total) by mouth daily.   predniSONE 20 MG tablet Commonly known as:  DELTASONE Take 3 PO QAM x3days, 2 PO QAM x3days, 1 PO QAM x3days What changed:    how much to take  how to take this  when to take this  additional instructions   PROBIOTIC DAILY PO Take 1 capsule by mouth daily.   rosuvastatin 10 MG tablet Commonly known as:  CRESTOR Take 1 tablet (10 mg total) by mouth daily. What changed:  when to take this   Vitamin D3 5000 units Caps Take 5,000 Units by mouth daily.      Follow-up Information    Scot Jun, FNP Follow up in 1  week(s).   Specialty:  Family Medicine Contact information: [place] [address] [phone]        Sanda Klein, MD Follow up.   Specialty:  Cardiology Contact information: [address] [place] [address] [phone]            Time coordinating discharge: 35 min  Signed:  Geradine Girt   [facility] 08/22/2017, 11:58 AM

## 2017-08-22 NOTE — Consult Note (Signed)
Cardiology Consultation:   Patient ID: Nancy Pham; [id number]; 04-04-1966   Admit date: 08/21/2017 Date of Consult: 08/22/2017  Primary Care Provider: Scot Jun, FNP Primary Cardiologist: Nancy Klein, MD - New  Patient Profile:   Nancy Pham is a 52 y.o. female with a hx of hypertension, hyperlipidemia, DM type 2, gout, asthma, ovarian/cervial cancer (s/p hysterectomy and chemotherapy 2004, in remission), obesity and chronic pain who is being seen today for the evaluation of chest pain at the request of Dr. Blaine Pham.  History of Present Illness:   Nancy Pham denies any previous MI or cardiac procedure. She says that she has a leaky valve and a murmur that is sometimes heard. She awoke yesterday morning with central chest pressure/squeezing, 10/10 in intensity and it went down under her left breast. No associated dyspnea, nausea, lightheadedness, palpitations. The pain was constant, did not come and go. The pain was worse with swallowing and eating. It was very bad when she swallowing her morning meds. She has never had this pain before. She presented to the ED about noon yesterday. She was given a GI cocktail that did not help and SL NTG X2 that did not help and morphine that did not help. She finally got some relief with Dilaudid at about 8 pm. He pain remained but was much less severe. This morning it is continuing but "tolerable", 4/10.   She notes that she has had a cold with productive cough for about a month. She is not very active due to arthritis in her joints and fibromyalgia. About the most she does is walking to the mailbox. She denies exertional chest pain or dyspnea with this.   She was seen in 03/2017 for orthostatic hypotension and has some shortness of breath. An echo was done which was normal. She says that she had a stress test within the last year and was probably around the time of her echo, but I cannot find the results.   She denies orthopnea, PND. She reports  generalized puffy swelling and is rubbing her arms when speaking of it. She takes lasix for this. She denies any smoking or alcohol use. She has a positive family history with her father dying of a heart attack around age 20. Her mother died in her 49's but the pt does not know the cause. SHe has 4 brothers and 2 sisters. One sister had a heart attack-unsure of are. All of them have hypertension.   Of note she has been taking prednisone for "swelling". She says she has arthritis and fibromyalgia. She does not recall what she ate the night prior to the pain. She has a history of GERD, per pt report, at least one time in the past but that was burning and this feels different.   BP was elevated at 176/112 initially. Now improved. Pt states that she had taken her morning medications. She feels that her BP was elevated due to the pain.   Chest xray negative, D-dimer elevated at 0.63 however CTA of the chest showed no evidence of PE. Troponins negative X4 and BNP normal at 16.7. EKG shows sinus rhythm with non-specific ST changes, did not progress with serial EKG's  Past Medical History:  Diagnosis Date  . Asthma   . Cancer ([place])   . Diabetes mellitus   . Gout   . Hyperlipidemia   . Hypertension     Past Surgical History:  Procedure Laterality Date  . ABDOMINAL HYSTERECTOMY       Home Medications:  Prior to Admission medications   Medication Sig Start Date End Date Taking? Authorizing Provider  acetaminophen (TYLENOL) 325 MG tablet Take 325-650 mg by mouth every 6 (six) hours as needed for mild pain, fever or headache.    Yes [provider]  albuterol (PROVENTIL HFA;VENTOLIN HFA) 108 (90 Base) MCG/ACT inhaler Inhale 1-2 puffs into the lungs every 6 (six) hours as needed for wheezing or shortness of breath. 07/07/17  Yes Nancy Jun, FNP  allopurinol (ZYLOPRIM) 300 MG tablet Take 1 tablet (300 mg total) by mouth daily. 07/07/17  Yes Nancy Jun, FNP  aspirin EC 81 MG  tablet Take 1 tablet (81 mg total) by mouth daily. 05/11/17  Yes Nancy Jun, FNP  benzonatate (TESSALON) 100 MG capsule Take 1-2 capsules (100-200 mg total) by mouth 3 (three) times daily as needed for cough. 08/12/17  Yes Nancy Jun, FNP  Cholecalciferol (VITAMIN D3) 5000 units CAPS Take 5,000 Units by mouth daily.   Yes [provider]  cyclobenzaprine (FLEXERIL) 10 MG tablet Take 1 tablet (10 mg total) by mouth 3 (three) times daily as needed for muscle spasms. 07/07/17  Yes Nancy Jun, FNP  ferrous sulfate 325 (65 FE) MG tablet Take 325 mg by mouth daily with breakfast.   Yes [provider]  fluticasone (FLONASE) 50 MCG/ACT nasal spray Place 2 sprays into both nostrils daily as needed for allergies or rhinitis.   Yes [provider]  furosemide (LASIX) 40 MG tablet Take 1 tablet (40 mg total) by mouth daily. 07/07/17  Yes Nancy Jun, FNP  Liraglutide (VICTOZA) 18 MG/3ML SOLN injection Inject 1.8 mLs (10.8 mg total) into the skin daily. 03/21/17  Yes Nancy Loll, MD  montelukast (SINGULAIR) 10 MG tablet Take 1 tablet (10 mg total) by mouth at bedtime. 07/07/17  Yes Nancy Jun, FNP  Multiple Vitamins-Minerals (CENTRUM SILVER 50+WOMEN) TABS Take 1 tablet by mouth daily.   Yes [provider]  NIFEdipine (PROCARDIA-XL/ADALAT CC) 60 MG 24 hr tablet Take 1 tablet (60 mg total) by mouth daily. 07/07/17  Yes Nancy Jun, FNP  potassium chloride SA (K-DUR,KLOR-CON) 20 MEQ tablet Take 1 tablet (20 mEq total) by mouth daily. 08/12/17  Yes Nancy Jun, FNP  predniSONE (DELTASONE) 20 MG tablet Take 3 PO QAM x3days, 2 PO QAM x3days, 1 PO QAM x3days Patient taking differently: Take 10-30 mg by mouth See admin instructions. TAKE 3 TABLETS ONCE DAILY IN THE MORNING FOR 3 DAYS, THEN 2 TABS ONCE IN THE MORNING FOR 3 DAYS, THEN 1 TAB IN THE MORNING FOR 3 DAYS 08/12/17  Yes Nancy Jun, FNP  Probiotic Product (PROBIOTIC DAILY  PO) Take 1 capsule by mouth daily.   Yes [provider]  rosuvastatin (CRESTOR) 10 MG tablet Take 1 tablet (10 mg total) by mouth daily. Patient taking differently: Take 10 mg by mouth at bedtime.  07/07/17  Yes Nancy Jun, FNP  EPINEPHrine 0.3 mg/0.3 mL IJ SOAJ injection Inject 0.3 mLs (0.3 mg total) into the muscle as needed (acute resp failure/worsening anaphylaxis). Patient not taking: Reported on 08/21/2017 05/11/17   Nancy Jun, FNP  famotidine (PEPCID) 20 MG tablet Take 1 tablet (20 mg total) by mouth 2 (two) times daily. Patient not taking: Reported on 08/21/2017 03/21/17   Nancy Loll, MD    Inpatient Medications: Scheduled Meds: . acidophilus   Oral Daily  . allopurinol  300 mg Oral Daily  . cholecalciferol  5,000 Units Oral  Daily  . enoxaparin (LOVENOX) injection  40 mg Subcutaneous Q24H  . ferrous sulfate  325 mg Oral Q breakfast  . furosemide  40 mg Oral Daily  . insulin aspart  0-5 Units Subcutaneous QHS  . insulin aspart  0-9 Units Subcutaneous TID WC  . montelukast  10 mg Oral QHS  . multivitamin with minerals  1 tablet Oral Daily  . NIFEdipine  60 mg Oral Daily  . pneumococcal 23 valent vaccine  0.5 mL Intramuscular Tomorrow-1000  . predniSONE  20 mg Oral Q breakfast   Followed by  . [START ON 08/24/2017] predniSONE  10 mg Oral Q breakfast  . rosuvastatin  10 mg Oral Daily   Continuous Infusions:  PRN Meds: acetaminophen, albuterol, ALPRAZolam, benzonatate, cyclobenzaprine, famotidine, fluticasone, hydrALAZINE, HYDROmorphone (DILAUDID) injection, nitroGLYCERIN, ondansetron (ZOFRAN) IV, zolpidem  Allergies:    Allergies  Allergen Reactions  . Ace Inhibitors Swelling    Swelling of the lips  . Tape Rash    No Band-Aids!!    Social History:   Social History   Socioeconomic History  . Marital status: Legally Separated    Spouse name: Not on file  . Number of children: Not on file  . Years of education: Not on file  . Highest  education level: Not on file  Social Needs  . Financial resource strain: Not on file  . Food insecurity - worry: Not on file  . Food insecurity - inability: Not on file  . Transportation needs - medical: Not on file  . Transportation needs - non-medical: Not on file  Occupational History  . Not on file  Tobacco Use  . Smoking status: Never Smoker  . Smokeless tobacco: Never Used  Substance and Sexual Activity  . Alcohol use: No  . Drug use: No  . Sexual activity: Not on file  Other Topics Concern  . Not on file  Social History Narrative  . Not on file    Family History:    Family History  Problem Relation Age of Onset  . Seizures Mother   . Heart attack Father   . Heart attack Sister   . Hypertension Sister   . Hypertension Brother   . Hypertension Brother   . Hypertension Brother   . Hypertension Brother   . Hypertension Sister      ROS:  Please see the history of present illness.   All other ROS reviewed and negative.     Physical Exam/Data:   Vitals:   08/22/17 0021 08/22/17 0022 08/22/17 0429 08/22/17 0724  BP:   138/72 128/61  Pulse:   82 78  Resp:   18   Temp:   98.6 F (37 C) 98.8 F (37.1 C)  TempSrc: Oral  Oral Oral  SpO2:   97% 98%  Weight:  275 lb 2.2 oz (124.8 kg)    Height:  5\' 5"  (1.651 m)     No intake or output data in the 24 hours ending 08/22/17 0847 Filed Weights   08/21/17 2059 08/22/17 0022  Weight: 275 lb 14.4 oz (125.1 kg) 275 lb 2.2 oz (124.8 kg)   Body mass index is 45.79 kg/m.  General:  Obese female, in no acute distress HEENT: normal Lymph: no adenopathy Neck: no JVD Endocrine:  No thryomegaly Vascular: No carotid bruits; FA pulses 2+ bilaterally without bruits  Cardiac:  normal S1, S2; RRR; no murmur  Lungs:  clear to auscultation bilaterally, no wheezing, rhonchi or rales  Abd: soft, nontender, no hepatomegaly  Ext: no edema Musculoskeletal:  No deformities, BUE and BLE strength normal and equal Skin: warm and dry   Neuro:  CNs 2-12 intact, no focal abnormalities noted Psych:  Normal affect   EKG:  The EKG was personally reviewed and demonstrates:  NSR at 74 bpm, diffuse non-specific ST changes, similar to previous in 03/2017 Telemetry:  Telemetry was personally reviewed and demonstrates:  NSR 80's-90's  Relevant CV Studies:  Echocardiogram 03/28/2017 Study Conclusions - Left ventricle: The cavity size was normal. Systolic function was   normal. The estimated ejection fraction was in the range of 55%   to 60%.   Laboratory Data:  Chemistry Recent Labs  Lab 08/21/17 1312  NA 139  K 3.7  CL 102  CO2 23  GLUCOSE 159*  BUN 12  CREATININE 0.67  CALCIUM 9.5  GFRNONAA >60  GFRAA >60  ANIONGAP 14    No results for input(s): PROT, ALBUMIN, AST, ALT, ALKPHOS, BILITOT in the last 168 hours. Hematology Recent Labs  Lab 08/21/17 1312  WBC 10.1  RBC 4.39  HGB 12.9  HCT 38.5  MCV 87.7  MCH 29.4  MCHC 33.5  RDW 14.0  PLT 298   Cardiac Enzymes Recent Labs  Lab 08/21/17 1912 08/22/17 0130 08/22/17 0543  TROPONINI <0.03 <0.03 <0.03    Recent Labs  Lab 08/21/17 1318 08/21/17 1916  TROPIPOC 0.00 0.00    BNP Recent Labs  Lab 08/21/17 1312  BNP 16.7    DDimer  Recent Labs  Lab 08/21/17 1312  DDIMER 0.63*    Radiology/Studies:  Dg Chest 2 View  Result Date: 08/21/2017 CLINICAL DATA:  Midline chest pain radiates under left breast. EXAM: CHEST  2 VIEW COMPARISON:  03/27/2017 FINDINGS: The lungs are clear without focal pneumonia, edema, pneumothorax or pleural effusion. The cardiopericardial silhouette is within normal limits for size. The visualized bony structures of the thorax are intact. IMPRESSION: No active cardiopulmonary disease. Electronically Signed   By: Misty Stanley M.D.   On: 08/21/2017 13:33   Ct Angio Chest Pe W And/or Wo Contrast  Result Date: 08/21/2017 CLINICAL DATA:  Patient with centralized chest pain radiating to the back. Elevated D-dimer. Evaluate  for pulmonary embolus. EXAM: CT ANGIOGRAPHY CHEST WITH CONTRAST TECHNIQUE: Multidetector CT imaging of the chest was performed using the standard protocol during bolus administration of intravenous contrast. Multiplanar CT image reconstructions and MIPs were obtained to evaluate the vascular anatomy. CONTRAST:  169mL ISOVUE-370 IOPAMIDOL (ISOVUE-370) INJECTION 76% COMPARISON:  Chest radiograph 08/21/2017; chest radiograph 03/27/2017 FINDINGS: Cardiovascular: Normal heart size. No pericardial effusion. Normal caliber thoracic aorta. Adequate opacification of the main pulmonary artery. No evidence for pulmonary embolus. Mediastinum/Nodes: No enlarged axillary, mediastinal or hilar lymphadenopathy. Small hiatal hernia. Lungs/Pleura: Central airways are patent. Dependent atelectasis within the bilateral lower lobes. No pleural effusion. Upper Abdomen: No acute process. Musculoskeletal: Thoracic spine degenerative changes. No aggressive or acute appearing osseous lesions. Review of the MIP images confirms the above findings. IMPRESSION: 1. No acute pulmonary embolus. 2. No acute process in the chest. Electronically Signed   By: Lovey Newcomer M.D.   On: 08/21/2017 18:17    Assessment and Plan:   1. Chest pain -No history of previous MI or cardiac issue except murmur -Atypical constant chest pressure now present for 24 hrs. No associated symptoms. Not responsive to NTG, GI cocktail  or morphine. Did improve with Dilaudid.  -Normal echo in 03/2017.  -Troponins negative X 4 -EKG with non-specific ST changes, similar to previous EKGs  and did not evolve on serial EKGs. -Chest xray negative -D-dimer elevated at 0.63 however CTA of the chest showed no evidence of PE. Primary has ordered LE dopplers.  -CVD risk factors include DM, HTN, HLD, obesity -Pt has risk factors, however, her symptoms are atypical. Differentials include GERD with recent prednisone treatment and exacerbation of her fibromyalgia. She may have some  degree of CAD but she is not presenting like ACS. Unable to find a previous stress test that she is sure she has had. Will discuss ordering stress test.  2 Hypertension -Home meds include lasix 40 mg daily, Nifedipine 60 mg daily -BP was elevated at 176/112 initially, likely due to pain. Now improved.   3. Hyperlipidemia -Crestor 10 mg daily -LDL 44 in 03/2017, calculated LDL 95 in 05/2017. Elevated triglycerides at 211.  -Continue current statin  4. Diabetes type 2 not on insulin -Well controlled with A1c 6.7 earlier this month   For questions or updates, please contact [facility] Please consult [email or web address] for contact info under Cardiology/STEMI.   Signed, Daune Perch, NP  08/22/2017 8:47 AM   I have seen and examined the patient along with Daune Perch, NP.  I have reviewed the chart, notes and new data.  I agree with NP's note.  Key new complaints: still has chest heaviness/gripping Key examination changes: normal CV exam. Obese. Key new findings / data: ECG during pain with minor nonspecific changes, similar to September 2018. Normal enzymes despite continuous symptoms > 12 hours. There is no trace of coronary, aortic, arch vessel or visceral arterial calcification on the chest CT. Metabolic risk factors generally well addressed.  PLAN: Treadmill stress test today. If normal, DC home. Low level of suspicion for acute coronary insufficiency as a cause for her complaints. Continue statin and glucose control. Needs weight loss and increased physical activity.  Nancy Klein, MD, [place] [phone] 08/22/2017, 9:08 AM

## 2017-08-22 NOTE — Progress Notes (Signed)
Preliminary results by tech - Venous Duplex Lower Ext. Completed. Negative for deep and superficial vein thrombosis. Kishia Shackett, BS, RDMS, RVT  

## 2017-08-22 NOTE — Plan of Care (Signed)
  Activity: Ability to tolerate increased activity will improve 08/22/2017 0036 - Progressing by Ocie Cornfield, RN Pt is ambulatory in room with out complaints of CP at this time.    Clinical Measurements: Diagnostic test results will improve 08/22/2017 0045 - Progressing by Ocie Cornfield, RN Initial cardiac enzymes negative, CT chest negative for PE-awaiting further testing results.   Pain Managment: General experience of comfort will improve 08/22/2017 0045 - Progressing by Ocie Cornfield, RN 08/22/2017 0036 - Progressing by Ocie Cornfield, RN

## 2017-08-22 NOTE — Progress Notes (Signed)
Pt reached 85% of APMHR at 2 minutes. She could not complete stage 1 of bruce secondary to fatigue.   Kerin Ransom PA-C 08/22/2017 10:58 AM

## 2017-09-02 ENCOUNTER — Ambulatory Visit: Payer: Self-pay | Admitting: Family Medicine

## 2017-09-07 ENCOUNTER — Encounter: Payer: Self-pay | Admitting: Family Medicine

## 2017-09-07 ENCOUNTER — Ambulatory Visit (INDEPENDENT_AMBULATORY_CARE_PROVIDER_SITE_OTHER): Payer: Self-pay | Admitting: Family Medicine

## 2017-09-07 ENCOUNTER — Telehealth: Payer: Self-pay | Admitting: Family Medicine

## 2017-09-07 ENCOUNTER — Ambulatory Visit (HOSPITAL_COMMUNITY)
Admission: RE | Admit: 2017-09-07 | Discharge: 2017-09-07 | Disposition: A | Discharge status: Home / Self Care | Payer: Self-pay | Source: Ambulatory Visit | Attending: Family Medicine | Admitting: Family Medicine

## 2017-09-07 VITALS — BP 140/80 | HR 100 | Temp 98.4°F | Resp 16 | Ht 65.0 in | Wt 279.0 lb

## 2017-09-07 DIAGNOSIS — M25552 Pain in left hip: Secondary | ICD-10-CM

## 2017-09-07 DIAGNOSIS — M247 Protrusio acetabuli: Secondary | ICD-10-CM | POA: Insufficient documentation

## 2017-09-07 MED ORDER — KETOROLAC TROMETHAMINE 30 MG/ML IJ SOLN
30.0000 mg | Freq: Once | INTRAMUSCULAR | Status: AC
  Administered 2017-09-07: 30 mg via INTRAMUSCULAR

## 2017-09-07 NOTE — Telephone Encounter (Signed)
Contact patient to advise degenerative changes noted on recent left hip x-ray are more than likely the cause of her persistent left hip pain. She will need to see an orthopedic specialist as soon as possible and continue treatment prescribed. I recommend that she negotiates a payment plan while she is completing the [place] application.  Carroll Sage. Kenton Kingfisher, MSN, FNP-C [facility]  [address] [place], [place] [address] [phone]

## 2017-09-07 NOTE — Progress Notes (Signed)
Patient ID: Nancy Pham, female    DOB: 1965/11/16, 52 y.o.   MRN: [id number]  PCP: Scot Jun, FNP  Chief Complaint  Patient presents with  . Follow-up    left hip pain    Subjective:  HPI Nancy Pham is a 52 y.o. female with  Diabetes, hypertension, and chronic pain. Medical history significant for type 2 diabetes mellitus, gout, uncontrolled hypertension, and chronic pain. Type 2 Diabetes,  presents for evaluation of on-going left hip pain. Patient reports that she has had two prednisone prescriptions and only started medication a few days prior. Nancy Pham is a very poor historian. During her last office visit on 08/12/2017, was evaluated for left foot pain and received a 9-day prednisone taper.  She reports only recently starting a medication and has about 2 tablets left and a refilled prescription which was picked up a few days ago. No refills placed on the prednisone that was prescribed on 08/12/2017 therefore unable to clarify with the patient is referring to regarding another prednisone prescription. Reports left hip pain has been chronic for several months. Over time the hip pain has continued to worsen impacting her ability to walk. Left hip pain is exacerbated by prolonged standing, sitting, and walking. She has not attempted relief with any OTC medication or warm or cool compresses.  Social History   Socioeconomic History  . Marital status: Legally Separated    Spouse name: Not on file  . Number of children: Not on file  . Years of education: Not on file  . Highest education level: Not on file  Social Needs  . Financial resource strain: Not on file  . Food insecurity - worry: Not on file  . Food insecurity - inability: Not on file  . Transportation needs - medical: Not on file  . Transportation needs - non-medical: Not on file  Occupational History  . Not on file  Tobacco Use  . Smoking status: Never Smoker  . Smokeless tobacco: Never Used  Substance and Sexual  Activity  . Alcohol use: No  . Drug use: No  . Sexual activity: Not on file  Other Topics Concern  . Not on file  Social History Narrative  . Not on file    Family History  Problem Relation Age of Onset  . Seizures Mother   . Heart attack Father   . Heart attack Sister   . Hypertension Sister   . Hypertension Brother   . Hypertension Brother   . Hypertension Brother   . Hypertension Brother   . Hypertension Sister    Review of Systems  Constitutional: Positive for fatigue.  Respiratory: Negative.   Cardiovascular: Negative.   Musculoskeletal: Positive for arthralgias and back pain.  Neurological: Negative.   Psychiatric/Behavioral: Negative.     Patient Active Problem List   Diagnosis Date Noted  . Chest pain 08/21/2017  . GERD (gastroesophageal reflux disease) 08/21/2017  . Gout 08/21/2017  . Diabetes mellitus without complication ([place]) 37/16/9678  . Hypertension   . Hyperlipidemia   . Asthma   . SOB (shortness of breath) 03/27/2017  . Anaphylaxis 03/20/2017    Allergies  Allergen Reactions  . Ace Inhibitors Swelling    Swelling of the lips  . Tape Rash    No Band-Aids!!    Prior to Admission medications   Medication Sig Start Date End Date Taking? Authorizing Provider  acetaminophen (TYLENOL) 325 MG tablet Take 325-650 mg by mouth every 6 (six) hours as needed for mild pain,  fever or headache.    Yes [provider]  albuterol (PROVENTIL HFA;VENTOLIN HFA) 108 (90 Base) MCG/ACT inhaler Inhale 1-2 puffs into the lungs every 6 (six) hours as needed for wheezing or shortness of breath. 07/07/17  Yes Scot Jun, FNP  allopurinol (ZYLOPRIM) 300 MG tablet Take 1 tablet (300 mg total) by mouth daily. 07/07/17  Yes Scot Jun, FNP  aspirin EC 81 MG tablet Take 1 tablet (81 mg total) by mouth daily. 05/11/17  Yes Scot Jun, FNP  Cholecalciferol (VITAMIN D3) 5000 units CAPS Take 5,000 Units by mouth daily.   Yes [provider]  cyclobenzaprine (FLEXERIL) 10 MG tablet Take 1 tablet (10 mg total) by mouth 3 (three) times daily as needed for muscle spasms. 07/07/17  Yes Scot Jun, FNP  EPINEPHrine 0.3 mg/0.3 mL IJ SOAJ injection Inject 0.3 mLs (0.3 mg total) into the muscle as needed (acute resp failure/worsening anaphylaxis). 05/11/17  Yes Scot Jun, FNP  famotidine (PEPCID) 20 MG tablet Take 1 tablet (20 mg total) by mouth 2 (two) times daily. 08/22/17  Yes Vann, Jessica U, DO  ferrous sulfate 325 (65 FE) MG tablet Take 325 mg by mouth daily with breakfast.   Yes [provider]  fluticasone (FLONASE) 50 MCG/ACT nasal spray Place 2 sprays into both nostrils daily as needed for allergies or rhinitis.   Yes [provider]  furosemide (LASIX) 40 MG tablet Take 1 tablet (40 mg total) by mouth daily. 07/07/17  Yes Scot Jun, FNP  Liraglutide (VICTOZA) 18 MG/3ML SOLN injection Inject 1.8 mLs (10.8 mg total) into the skin daily. 03/21/17  Yes Demetrios Loll, MD  montelukast (SINGULAIR) 10 MG tablet Take 1 tablet (10 mg total) by mouth at bedtime. 07/07/17  Yes Scot Jun, FNP  Multiple Vitamins-Minerals (CENTRUM SILVER 50+WOMEN) TABS Take 1 tablet by mouth daily.   Yes [provider]  NIFEdipine (PROCARDIA-XL/ADALAT CC) 60 MG 24 hr tablet Take 1 tablet (60 mg total) by mouth daily. 07/07/17  Yes Scot Jun, FNP  potassium chloride SA (K-DUR,KLOR-CON) 20 MEQ tablet Take 1 tablet (20 mEq total) by mouth daily. 08/12/17  Yes Scot Jun, FNP  predniSONE (DELTASONE) 20 MG tablet Take 3 PO QAM x3days, 2 PO QAM x3days, 1 PO QAM x3days Patient taking differently: Take 10-30 mg by mouth See admin instructions. TAKE 3 TABLETS ONCE DAILY IN THE MORNING FOR 3 DAYS, THEN 2 TABS ONCE IN THE MORNING FOR 3 DAYS, THEN 1 TAB IN THE MORNING FOR 3 DAYS 08/12/17  Yes Scot Jun, FNP  Probiotic Product (PROBIOTIC DAILY PO) Take 1 capsule by mouth daily.   Yes [provider]  rosuvastatin (CRESTOR) 10 MG tablet Take 1 tablet (10 mg total) by mouth daily. Patient taking differently: Take 10 mg by mouth at bedtime.  07/07/17  Yes Scot Jun, FNP  benzonatate (TESSALON) 100 MG capsule Take 1-2 capsules (100-200 mg total) by mouth 3 (three) times daily as needed for cough. Patient not taking: Reported on 09/07/2017 08/12/17   Scot Jun, FNP    Past Medical, Surgical Family and Social History reviewed and updated.    Objective:   Today's Vitals   09/07/17 1447  BP: 140/80  Pulse: 100  Resp: 16  Temp: 98.4 F (36.9 C)  TempSrc: Oral  SpO2: 98%  Weight: 279 lb (126.6 kg)  Height: 5\' 5"  (1.651 m)    Wt Readings from Last 3 Encounters:  09/07/17 279 lb (126.6 kg)  08/22/17 275 lb 2.2 oz (124.8 kg)  08/12/17 280 lb (127 kg)   Physical Exam  Constitutional: She appears well-developed and well-nourished.  Cardiovascular: Normal rate, regular rhythm, normal heart sounds and intact distal pulses.  Pulmonary/Chest: Effort normal and breath sounds normal.  Musculoskeletal:       Left hip: She exhibits tenderness and bony tenderness. She exhibits normal range of motion, normal strength, no swelling and no crepitus.  Neurological: She is alert. Gait abnormal.  Skin: Skin is warm and dry.  Psychiatric: Her speech is normal and behavior is normal. Judgment and thought content normal. Her mood appears anxious.   Assessment & Plan:  1. Left hip pain, this is a chronic recurrent problem.  Will obtain a left hip complete view unilateral image today.  Will administer Toradol 30 mg injection here in office to reduce inflammation.  Patient is advised to complete the initial prednisone pack prescribed and not to take an additional redness do not that she may have on hand.  He was counseled that excessive amounts of prednisone precipitate worsening diabetes.  Patient has been given another [employer] assistance application to complete  and submit.  I will go ahead and refer her to orthopedic surgery and she can schedule appointment at her convenience as she will assistance becomes available.  Result Date: 09/07/2017 CLINICAL DATA:  Episode of left hip instability this morning followed by constant nagging pain. History of similar previous episodes. EXAM: DG HIP (WITH OR WITHOUT PELVIS) 2-3V LEFT COMPARISON:  None in PACs FINDINGS: The bony pelvis is subjectively adequately mineralized. There is no lytic nor blastic lesion. On the left there is mild protrusio acetabuli. There is mild diffuse narrowing of the hip joint space. The articular surfaces of the femoral head and acetabulum remains smoothly rounded. The femoral neck, intertrochanteric, and subtrochanteric regions are normal. 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 Mild symmetric narrowing of the left hip joint space. No acute bony abnormality. Electronically Signed   By: David  Martinique M.D.   On: 09/07/2017 16:46    - Ambulatory referral to Orthopedic Surgery  Keep scheduled follow-up on file.  Carroll Sage. Kenton Kingfisher, MSN, FNP-C [facility]  [address] [place], [place] [address] [phone]

## 2017-09-08 NOTE — Telephone Encounter (Signed)
Patient notified

## 2017-09-27 ENCOUNTER — Encounter (INDEPENDENT_AMBULATORY_CARE_PROVIDER_SITE_OTHER): Payer: Self-pay | Admitting: Orthopaedic Surgery

## 2017-09-27 ENCOUNTER — Ambulatory Visit (INDEPENDENT_AMBULATORY_CARE_PROVIDER_SITE_OTHER): Payer: Self-pay | Admitting: Orthopaedic Surgery

## 2017-09-27 DIAGNOSIS — M1612 Unilateral primary osteoarthritis, left hip: Secondary | ICD-10-CM

## 2017-09-27 MED ORDER — MELOXICAM 7.5 MG PO TABS: 15.0000 mg | ORAL_TABLET | Freq: Every day | ORAL | 2 refills | Status: DC | PRN

## 2017-09-27 NOTE — Progress Notes (Signed)
Office Visit Note   Patient: Nancy Pham           Date of Birth: Mar 02, 1966           MRN: [id number] Visit Date: 09/27/2017              Requested by: Scot Jun, [place], [place] [address] PCP: Scot Jun, FNP   Assessment & Plan: Visit Diagnoses:  1. Primary osteoarthritis of left hip     Plan: Impression is 52 year old female with left hip degenerative joint disease and mild trochanteric bursitis.  I think the majority of her symptoms are coming from her hip.  We will set her up with left hip injection with Dr. Ernestina Patches.  Prescription for meloxicam.  I stressed the importance of weight loss as patient has BMI of 46 and not a good surgical candidate at this point.  Questions encouraged and answered.  Follow-up as needed.  Follow-Up Instructions: Return if symptoms worsen or fail to improve.   Orders:  Orders Placed This Encounter  Procedures  . Ambulatory referral to Physical Medicine Rehab   Meds ordered this encounter  Medications  . meloxicam (MOBIC) 7.5 MG tablet    Sig: Take 2 tablets (15 mg total) by mouth daily as needed for pain.    Dispense:  30 tablet    Refill:  2      Procedures: No procedures performed   Clinical Data: No additional findings.   Subjective: Chief Complaint  Patient presents with  . Left Hip - Pain    Nancy Pham is a very pleasant 52 year old female comes in with chronic left hip pain without radiation.  She has a sensation of the hip popping out of place.  She has tried oral cortical steroids but these have provided her with temporary relief as well as worsening her blood sugars.  She did have a Toradol injection which gave her temporary relief.    Review of Systems  Constitutional: Negative.   HENT: Negative.   Eyes: Negative.   Respiratory: Negative.   Cardiovascular: Negative.   Endocrine: Negative.   Musculoskeletal: Negative.   Neurological: Negative.   Hematological: Negative.     Psychiatric/Behavioral: Negative.   All other systems reviewed and are negative.    Objective: Vital Signs: There were no vitals taken for this visit.  Physical Exam  Constitutional: She is oriented to person, place, and time. She appears well-developed and well-nourished.  HENT:  Head: Normocephalic and atraumatic.  Eyes: EOM are normal.  Neck: Neck supple.  Pulmonary/Chest: Effort normal.  Abdominal: Soft.  Neurological: She is alert and oriented to person, place, and time.  Skin: Skin is warm. Capillary refill takes less than 2 seconds.  Psychiatric: She has a normal mood and affect. Her behavior is normal. Judgment and thought content normal.  Nursing note and vitals reviewed.   Ortho Exam Left hip exam shows painful rotation as well as positive Stinchfield sign.  Negative straight leg.  Mild tenderness over the trochanteric bursa. Specialty Comments:  No specialty comments available.  Imaging: No results found.   PMFS History: Patient Active Problem List   Diagnosis Date Noted  . Chest pain 08/21/2017  . GERD (gastroesophageal reflux disease) 08/21/2017  . Gout 08/21/2017  . Diabetes mellitus without complication ([place]) 19/14/7829  . Hypertension   . Hyperlipidemia   . Asthma   . SOB (shortness of breath) 03/27/2017  . Anaphylaxis 03/20/2017   Past Medical History:  Diagnosis Date  .  Asthma   . Cancer ([place])   . Diabetes mellitus   . Gout   . Hyperlipidemia   . Hypertension     Family History  Problem Relation Age of Onset  . Seizures Mother   . Heart attack Father   . Heart attack Sister   . Hypertension Sister   . Hypertension Brother   . Hypertension Brother   . Hypertension Brother   . Hypertension Brother   . Hypertension Sister     Past Surgical History:  Procedure Laterality Date  . ABDOMINAL HYSTERECTOMY     Social History   Occupational History  . Not on file  Tobacco Use  . Smoking status: Never Smoker  . Smokeless tobacco:  Never Used  Substance and Sexual Activity  . Alcohol use: No  . Drug use: No  . Sexual activity: Not on file

## 2017-09-29 ENCOUNTER — Telehealth: Payer: Self-pay | Admitting: Pharmacist

## 2017-09-29 NOTE — Telephone Encounter (Signed)
09/29/2017 11:54:11 AM - Ventolin HFA refill  09/29/17 Placed refill online with [facility] for Ventolin HFA 62mcg, to release 11/17/16, order# [id number].Delos Haring

## 2017-10-24 ENCOUNTER — Ambulatory Visit (INDEPENDENT_AMBULATORY_CARE_PROVIDER_SITE_OTHER): Payer: Self-pay | Admitting: Physical Medicine and Rehabilitation

## 2017-10-24 ENCOUNTER — Encounter (INDEPENDENT_AMBULATORY_CARE_PROVIDER_SITE_OTHER): Payer: Self-pay | Admitting: Physical Medicine and Rehabilitation

## 2017-10-24 ENCOUNTER — Ambulatory Visit (INDEPENDENT_AMBULATORY_CARE_PROVIDER_SITE_OTHER): Payer: Self-pay

## 2017-10-24 VITALS — BP 158/94 | HR 82 | Temp 98.0°F

## 2017-10-24 DIAGNOSIS — M25552 Pain in left hip: Secondary | ICD-10-CM

## 2017-10-24 NOTE — Progress Notes (Signed)
.  Numeric Pain Rating Scale and Functional Assessment Average Pain 10   In the last MONTH (on 0-10 scale) has pain interfered with the following?  1. General activity like being  able to carry out your everyday physical activities such as walking, climbing stairs, carrying groceries, or moving a chair?  Rating(9)   +Driver, -BT, -Dye Allergies.  

## 2017-10-24 NOTE — Patient Instructions (Signed)

## 2017-10-24 NOTE — Progress Notes (Signed)
Nancy Pham - 52 y.o. female MRN [id number]  Date of birth: May 14, 1966  Office Visit Note: Visit Date: 10/24/2017 PCP: Scot Jun, FNP Referred by: Scot Jun, FNP  Subjective: Chief Complaint  Patient presents with  . Left Hip - Pain   HPI: Mrs. Nancy Pham is a 52 year old female, morbidly obese, who comes in at the request of Dr. Erlinda Hong for diagnostic and hopefully therapeutic left hip anesthetic arthrogram.  Patient is a diabetic.  She has been having left hip pain and pressure that she feels like the hip is coming out of socket.  She does have degenerative joint disease and likely some bursitis.  She is failed conservative care.   ROS Otherwise per HPI.  Assessment & Plan: Visit Diagnoses:  1. Pain in left hip     Plan: No additional findings.   Meds & Orders: No orders of the defined types were placed in this encounter.   Orders Placed This Encounter  Procedures  . Large Joint Inj: L hip joint  . XR C-ARM NO REPORT    Follow-up: Return if symptoms worsen or fail to improve, for Dr. Erlinda Hong.   Procedures: Large Joint Inj: L hip joint on 10/24/2017 3:41 PM Indications: pain and diagnostic evaluation Details: 22 G needle, anterior approach  Arthrogram: Yes  Medications: 80 mg triamcinolone acetonide 40 MG/ML; 3 mL bupivacaine 0.5 % Outcome: tolerated well, no immediate complications  Arthrogram demonstrated excellent flow of contrast throughout the joint surface without extravasation or obvious defect.  The patient had relief of symptoms during the anesthetic phase of the injection.  Procedure, treatment alternatives, risks and benefits explained, specific risks discussed. Consent was given by the patient. Immediately prior to procedure a time out was called to verify the correct patient, procedure, equipment, support staff and site/side marked as required. Patient was prepped and draped in the usual sterile fashion.      No notes on file   Clinical History: No  specialty comments available.   She reports that she has never smoked. She has never used smokeless tobacco.  Recent Labs    03/27/17 1546 06/24/17 1555 08/22/17 0130  HGBA1C 6.1* 6.4* 6.7*    Objective:  VS:  HT:    WT:   BMI:     BP:(!) 158/94  HR:82bpm  TEMP:98 F (36.7 C)( )  RESP:99 % Physical Exam  Musculoskeletal:  Painful range of motion of the left hip with pain over the greater trochanter and pain in the groin with rotation.    Ortho Exam Imaging: No results found.  Past Medical/Family/Surgical/Social History: Medications & Allergies reviewed per EMR, new medications updated. Patient Active Problem List   Diagnosis Date Noted  . Chest pain 08/21/2017  . GERD (gastroesophageal reflux disease) 08/21/2017  . Gout 08/21/2017  . Diabetes mellitus without complication ([facility]) 44/09/4740  . Hypertension   . Hyperlipidemia   . Asthma   . SOB (shortness of breath) 03/27/2017  . Anaphylaxis 03/20/2017   Past Medical History:  Diagnosis Date  . Asthma   . Cancer ([facility])   . Diabetes mellitus   . Gout   . Hyperlipidemia   . Hypertension    Family History  Problem Relation Age of Onset  . Seizures Mother   . Heart attack Father   . Heart attack Sister   . Hypertension Sister   . Hypertension Brother   . Hypertension Brother   . Hypertension Brother   . Hypertension Brother   . Hypertension Sister  Past Surgical History:  Procedure Laterality Date  . ABDOMINAL HYSTERECTOMY     Social History   Occupational History  . Not on file  Tobacco Use  . Smoking status: Never Smoker  . Smokeless tobacco: Never Used  Substance and Sexual Activity  . Alcohol use: No  . Drug use: No  . Sexual activity: Not on file

## 2017-10-28 MED ORDER — TRIAMCINOLONE ACETONIDE 40 MG/ML IJ SUSP
80.0000 mg | INTRAMUSCULAR | Status: AC | PRN
  Administered 2017-10-24: 80 mg via INTRA_ARTICULAR

## 2017-10-28 MED ORDER — BUPIVACAINE HCL 0.5 % IJ SOLN
3.0000 mL | INTRAMUSCULAR | Status: AC | PRN
  Administered 2017-10-24: 3 mL via INTRA_ARTICULAR

## 2017-11-03 ENCOUNTER — Telehealth: Payer: Self-pay | Admitting: Pharmacy Technician

## 2017-11-03 NOTE — Telephone Encounter (Signed)
Patient failed to provide 2019 poi.  No additional medication assistance will be provided by [facility] without the required proof of income documentation.  Patient notified by letter.  Betty J. Kluttz Care Manager [facility] 

## 2017-11-15 ENCOUNTER — Ambulatory Visit: Payer: Self-pay | Admitting: Family Medicine

## 2017-11-17 ENCOUNTER — Ambulatory Visit: Payer: Self-pay | Admitting: Family Medicine

## 2017-12-02 ENCOUNTER — Other Ambulatory Visit: Payer: Self-pay

## 2017-12-02 ENCOUNTER — Encounter: Payer: Self-pay | Admitting: Family Medicine

## 2017-12-02 ENCOUNTER — Ambulatory Visit (INDEPENDENT_AMBULATORY_CARE_PROVIDER_SITE_OTHER): Payer: Self-pay | Admitting: Family Medicine

## 2017-12-02 VITALS — BP 144/80 | HR 78 | Temp 98.6°F | Resp 14 | Ht 65.0 in | Wt 283.0 lb

## 2017-12-02 DIAGNOSIS — E119 Type 2 diabetes mellitus without complications: Secondary | ICD-10-CM

## 2017-12-02 DIAGNOSIS — I10 Essential (primary) hypertension: Secondary | ICD-10-CM

## 2017-12-02 DIAGNOSIS — Z6841 Body Mass Index (BMI) 40.0 and over, adult: Secondary | ICD-10-CM

## 2017-12-02 DIAGNOSIS — R600 Localized edema: Secondary | ICD-10-CM

## 2017-12-02 LAB — POCT URINALYSIS DIPSTICK
BILIRUBIN UA: NEGATIVE
Blood, UA: NEGATIVE
GLUCOSE UA: NEGATIVE
KETONES UA: NEGATIVE
Leukocytes, UA: NEGATIVE
Nitrite, UA: NEGATIVE
SPEC GRAV UA: 1.02 (ref 1.010–1.025)
Urobilinogen, UA: 0.2 E.U./dL
pH, UA: 7 (ref 5.0–8.0)

## 2017-12-02 LAB — GLUCOSE, POCT (MANUAL RESULT ENTRY): POC GLUCOSE: 123 mg/dL — AB (ref 70–99)

## 2017-12-02 LAB — POCT GLYCOSYLATED HEMOGLOBIN (HGB A1C): HEMOGLOBIN A1C: 6.8

## 2017-12-02 MED ORDER — FUROSEMIDE 40 MG PO TABS: 40.0000 mg | ORAL_TABLET | Freq: Two times a day (BID) | ORAL | 2 refills | Status: DC

## 2017-12-02 NOTE — Progress Notes (Addendum)
Subjective:    Patient ID: Nancy Pham, female    DOB: 24-Sep-1965, 52 y.o.   MRN: [id number]   Nancy Pham, a 52 year old female with a medical history significant for morbid obesity, type 2 DM, and hypertension presents for a follow up of chronic conditions. She says that she has been taking medications inconsistently over the past several weeks. She does not follow a low fat, low carbohydrate diet or exercise routinely. Body mass index is 47.09 kg/m.  Diabetes  She has type 2 diabetes mellitus. Her disease course has been stable. Associated symptoms include chest pain. Pertinent negatives for diabetes include no blurred vision, no fatigue, no foot ulcerations, no polydipsia, no polyphagia, no polyuria and no visual change. There are no hypoglycemic complications. Risk factors for coronary artery disease include hypertension, sedentary lifestyle and obesity. She is compliant with treatment some of the time. She is following a generally unhealthy diet. She has not had a previous visit with a dietitian. She participates in exercise intermittently. She does not see a podiatrist.Eye exam is not current.  Hypertension  The problem is uncontrolled. Associated symptoms include chest pain, palpitations and peripheral edema. Pertinent negatives include no blurred vision. Risk factors for coronary artery disease include obesity and sedentary lifestyle.     Past Medical History:  Diagnosis Date  . Asthma   . Cancer ([place])   . Diabetes mellitus   . Gout   . Hyperlipidemia   . Hypertension    Social History   Socioeconomic History  . Marital status: Legally Separated    Spouse name: Not on file  . Number of children: Not on file  . Years of education: Not on file  . Highest education level: Not on file  Occupational History  . Not on file  Social Needs  . Financial resource strain: Not on file  . Food insecurity:    Worry: Not on file    Inability: Not on file  . Transportation needs:     Medical: Not on file    Non-medical: Not on file  Tobacco Use  . Smoking status: Never Smoker  . Smokeless tobacco: Never Used  Substance and Sexual Activity  . Alcohol use: No  . Drug use: No  . Sexual activity: Not on file  Lifestyle  . Physical activity:    Days per week: Not on file    Minutes per session: Not on file  . Stress: Not on file  Relationships  . Social connections:    Talks on phone: Not on file    Gets together: Not on file    Attends religious service: Not on file    Active member of club or organization: Not on file    Attends meetings of clubs or organizations: Not on file    Relationship status: Not on file  . Intimate partner violence:    Fear of current or ex partner: Not on file    Emotionally abused: Not on file    Physically abused: Not on file    Forced sexual activity: Not on file  Other Topics Concern  . Not on file  Social History Narrative  . Not on file  There is no immunization history for the selected administration types on file for this patient.  Review of Systems  Constitutional: Negative for fatigue.  HENT: Negative.   Eyes: Negative for blurred vision, photophobia, redness and visual disturbance.  Respiratory: Negative.   Cardiovascular: Positive for chest pain, palpitations and leg  swelling.  Gastrointestinal: Negative.   Endocrine: Negative.  Negative for polydipsia, polyphagia and polyuria.  Genitourinary: Negative for decreased urine volume, urgency, vaginal bleeding, vaginal discharge and vaginal pain.  Musculoskeletal: Negative.   Psychiatric/Behavioral: Negative.        Objective:   Physical Exam  Constitutional: She is oriented to person, place, and time.  Morbid obesity  Cardiovascular: Normal rate, regular rhythm and normal heart sounds.  Bilateral lower extremity edema 2+  Pulmonary/Chest: Effort normal and breath sounds normal.  Abdominal: Soft. Bowel sounds are normal.  Increased abdominal girth   Musculoskeletal: Normal range of motion.  Neurological: She is alert and oriented to person, place, and time.  Skin: Skin is warm and dry.       BP (!) 144/80 Comment: manually  Pulse 78   Temp 98.6 F (37 C) (Oral)   Resp 14   Ht 5\' 5"  (1.651 m)   Wt 283 lb (128.4 kg)   SpO2 100%   BMI 47.09 kg/m  Assessment & Plan:  1. Diabetes mellitus without complication ([facility]) Hemoglobin a1c is 6.8, which is at goal.  Goal is < 7 Advised to continue carbohydrate modified diet and exercise plan. Discussed diet at length - Glucose (CBG) - HgB A1c - Urinalysis Dipstick  2. Essential hypertension Blood pressure is above goal on current medication regimen.  Will increase furosemide to 40 mg BID Advised to return in 2 weeks for reevaluation of BP and potassium  Reviewed EKG,no acute abnormalities Continue medication, monitor blood pressure at home. Continue DASH diet. Reminded to report to ER if any CP, SOB, nausea, dizziness, severe HA, changes vision/speech, left arm numbness and tingling and jaw pain.    - Urinalysis Dipstick - furosemide (LASIX) 40 MG tablet; Take 1 tablet (40 mg total) by mouth 2 (two) times daily.  Dispense: 60 tablet; Refill: 2 - Basic Metabolic Panel - EKG 75-IEPP - Microalbumin/Creatinine Ratio, Urine  3. Morbid obesity with BMI of 45.0-49.9, adult (HCC) Recommend a lowfat, low carbohydrate diet divided over 5-6 small meals, increase water intake to 6-8 glasses, and 150 minutes per week of cardiovascular exercise.    4. Bilateral lower extremity edema - furosemide (LASIX) 40 MG tablet; Take 1 tablet (40 mg total) by mouth 2 (two) times daily.  Dispense: 60 tablet; Refill: 2   RTC: 3 months for chronic conditions  Donia Pounds  MSN, FNP-C [facility] [address] [place], [place] [address] [phone]

## 2017-12-02 NOTE — Patient Instructions (Signed)
For bilateral lower extremity edema we will increase furosemide to 40 mg twice daily.  I also recommend that you check your weight daily and maintain a diary.  Follow-up in 2 weeks for a blood pressure check and in 1 month for hypertension.  You also warrant a referral to cardiology.  I will send referral as your payer source becomes available.  Blood pressure is above goal on current medication regimen. Continue low-sodium diet.  Also increase daily activity level.   DASH Eating Plan DASH stands for "Dietary Approaches to Stop Hypertension." The DASH eating plan is a healthy eating plan that has been shown to reduce high blood pressure (hypertension). It may also reduce your risk for type 2 diabetes, heart disease, and stroke. The DASH eating plan may also help with weight loss. What are tips for following this plan? General guidelines  Avoid eating more than 2,300 mg (milligrams) of salt (sodium) a day. If you have hypertension, you may need to reduce your sodium intake to 1,500 mg a day.  Limit alcohol intake to no more than 1 drink a day for nonpregnant women and 2 drinks a day for men. One drink equals 12 oz of beer, 5 oz of wine, or 1 oz of hard liquor.  Work with your health care provider to maintain a healthy body weight or to lose weight. Ask what an ideal weight is for you.  Get at least 30 minutes of exercise that causes your heart to beat faster (aerobic exercise) most days of the week. Activities may include walking, swimming, or biking.  Work with your health care provider or diet and nutrition specialist (dietitian) to adjust your eating plan to your individual calorie needs. Reading food labels  Check food labels for the amount of sodium per serving. Choose foods with less than 5 percent of the Daily Value of sodium. Generally, foods with less than 300 mg of sodium per serving fit into this eating plan.  To find whole grains, look for the word "whole" as the first word in the  ingredient list. Shopping  Buy products labeled as "low-sodium" or "no salt added."  Buy fresh foods. Avoid canned foods and premade or frozen meals. Cooking  Avoid adding salt when cooking. Use salt-free seasonings or herbs instead of table salt or sea salt. Check with your health care provider or pharmacist before using salt substitutes.  Do not fry foods. Cook foods using healthy methods such as baking, boiling, grilling, and broiling instead.  Cook with heart-healthy oils, such as olive, canola, soybean, or sunflower oil. Meal planning   Eat a balanced diet that includes: ? 5 or more servings of fruits and vegetables each day. At each meal, try to fill half of your plate with fruits and vegetables. ? Up to 6-8 servings of whole grains each day. ? Less than 6 oz of lean meat, poultry, or fish each day. A 3-oz serving of meat is about the same size as a deck of cards. One egg equals 1 oz. ? 2 servings of low-fat dairy each day. ? A serving of nuts, seeds, or beans 5 times each week. ? Heart-healthy fats. Healthy fats called Omega-3 fatty acids are found in foods such as flaxseeds and coldwater fish, like sardines, salmon, and mackerel.  Limit how much you eat of the following: ? Canned or prepackaged foods. ? Food that is high in trans fat, such as fried foods. ? Food that is high in saturated fat, such as fatty meat. ?  Sweets, desserts, sugary drinks, and other foods with added sugar. ? Full-fat dairy products.  Do not salt foods before eating.  Try to eat at least 2 vegetarian meals each week.  Eat more home-cooked food and less restaurant, buffet, and fast food.  When eating at a restaurant, ask that your food be prepared with less salt or no salt, if possible. What foods are recommended? The items listed may not be a complete list. Talk with your dietitian about what dietary choices are best for you. Grains Whole-grain or whole-wheat bread. Whole-grain or whole-wheat  pasta. Brown rice. [place]. Bulgur. Whole-grain and low-sodium cereals. Pita bread. Low-fat, low-sodium crackers. Whole-wheat flour tortillas. Vegetables Fresh or frozen vegetables (raw, steamed, roasted, or grilled). Low-sodium or reduced-sodium tomato and vegetable juice. Low-sodium or reduced-sodium tomato sauce and tomato paste. Low-sodium or reduced-sodium canned vegetables. Fruits All fresh, dried, or frozen fruit. Canned fruit in natural juice (without added sugar). Meat and other protein foods Skinless chicken or [place]. Ground chicken or [place]. Pork with fat trimmed off. Fish and seafood. Egg whites. Dried beans, peas, or lentils. Unsalted nuts, nut butters, and seeds. Unsalted canned beans. Lean cuts of beef with fat trimmed off. Low-sodium, lean deli meat. Dairy Low-fat (1%) or fat-free (skim) milk. Fat-free, low-fat, or reduced-fat cheeses. Nonfat, low-sodium ricotta or cottage cheese. Low-fat or nonfat yogurt. Low-fat, low-sodium cheese. Fats and oils Soft margarine without trans fats. Vegetable oil. Low-fat, reduced-fat, or light mayonnaise and salad dressings (reduced-sodium). Canola, safflower, olive, soybean, and sunflower oils. Avocado. Seasoning and other foods Herbs. Spices. Seasoning mixes without salt. Unsalted popcorn and pretzels. Fat-free sweets. What foods are not recommended? The items listed may not be a complete list. Talk with your dietitian about what dietary choices are best for you. Grains Baked goods made with fat, such as croissants, muffins, or some breads. Dry pasta or rice meal packs. Vegetables Creamed or fried vegetables. Vegetables in a cheese sauce. Regular canned vegetables (not low-sodium or reduced-sodium). Regular canned tomato sauce and paste (not low-sodium or reduced-sodium). Regular tomato and vegetable juice (not low-sodium or reduced-sodium). Angie Fava. Olives. Fruits Canned fruit in a light or heavy syrup. Fried fruit. Fruit in cream or  butter sauce. Meat and other protein foods Fatty cuts of meat. Ribs. Fried meat. Berniece Salines. Sausage. Bologna and other processed lunch meats. Salami. Fatback. Hotdogs. Bratwurst. Salted nuts and seeds. Canned beans with added salt. Canned or smoked fish. Whole eggs or egg yolks. Chicken or [place] with skin. Dairy Whole or 2% milk, cream, and half-and-half. Whole or full-fat cream cheese. Whole-fat or sweetened yogurt. Full-fat cheese. Nondairy creamers. Whipped toppings. Processed cheese and cheese spreads. Fats and oils Butter. Stick margarine. Lard. Shortening. Ghee. Bacon fat. Tropical oils, such as coconut, palm kernel, or palm oil. Seasoning and other foods Salted popcorn and pretzels. Onion salt, garlic salt, seasoned salt, table salt, and sea salt. Worcestershire sauce. Tartar sauce. Barbecue sauce. Teriyaki sauce. Soy sauce, including reduced-sodium. Steak sauce. Canned and packaged gravies. Fish sauce. Oyster sauce. Cocktail sauce. Horseradish that you find on the shelf. Ketchup. Mustard. Meat flavorings and tenderizers. Bouillon cubes. Hot sauce and Tabasco sauce. Premade or packaged marinades. Premade or packaged taco seasonings. Relishes. Regular salad dressings. Where to find more information:  National Heart, Lung, and [place]: https://wilson-eaton.com/  American Heart Association: www.heart.org Summary  The DASH eating plan is a healthy eating plan that has been shown to reduce high blood pressure (hypertension). It may also reduce your risk for type 2 diabetes,  heart disease, and stroke.  With the DASH eating plan, you should limit salt (sodium) intake to 2,300 mg a day. If you have hypertension, you may need to reduce your sodium intake to 1,500 mg a day.  When on the DASH eating plan, aim to eat more fresh fruits and vegetables, whole grains, lean proteins, low-fat dairy, and heart-healthy fats.  Work with your health care provider or diet and nutrition specialist (dietitian) to  adjust your eating plan to your individual calorie needs. This information is not intended to replace advice given to you by your health care provider. Make sure you discuss any questions you have with your health care provider. Document Released: 07/01/2011 Document Revised: 07/05/2016 Document Reviewed: 07/05/2016 Elsevier Interactive Patient Education  [employer].

## 2017-12-03 LAB — MICROALBUMIN / CREATININE URINE RATIO
Creatinine, Urine: 212.8 mg/dL
Microalb/Creat Ratio: 23.6 mg/g creat (ref 0.0–30.0)
Microalbumin, Urine: 50.3 ug/mL

## 2017-12-03 LAB — BASIC METABOLIC PANEL
BUN / CREAT RATIO: 13 (ref 9–23)
BUN: 9 mg/dL (ref 6–24)
CO2: 26 mmol/L (ref 20–29)
CREATININE: 0.69 mg/dL (ref 0.57–1.00)
Calcium: 9.6 mg/dL (ref 8.7–10.2)
Chloride: 100 mmol/L (ref 96–106)
GFR calc non Af Amer: 100 mL/min/{1.73_m2} (ref >59)
GFR, EST AFRICAN AMERICAN: 116 mL/min/{1.73_m2} (ref >59)
Glucose: 99 mg/dL (ref 65–99)
Potassium: 4 mmol/L (ref 3.5–5.2)
Sodium: 141 mmol/L (ref 134–144)

## 2017-12-12 ENCOUNTER — Ambulatory Visit: Payer: Self-pay

## 2017-12-12 VITALS — BP 138/72 | Wt 277.5 lb

## 2017-12-12 DIAGNOSIS — Z79899 Other long term (current) drug therapy: Secondary | ICD-10-CM

## 2017-12-12 NOTE — Progress Notes (Addendum)
[facility] Visit Note  Patient: Nancy Pham MRN: [id number] Date of Birth: 13-Oct-1965 PCP: Scot Jun, FNP   Dellia Cloud 52 y.o. female presents for a MTM visit today.  BP 138/72 (BP Location: Right Arm, Patient Position: Sitting, Cuff Size: Large)   Wt 277 lb 8 oz (125.9 kg)   BMI 46.18 kg/m   Patient Information   Past Medical History:  Diagnosis Date  . Asthma   . Cancer ([place])   . Diabetes mellitus   . Gout   . Hyperlipidemia   . Hypertension       Past Surgical History:  Procedure Laterality Date  . ABDOMINAL HYSTERECTOMY       Family History  Problem Relation Age of Onset  . Seizures Mother   . Heart attack Father   . Heart attack Sister   . Hypertension Sister   . Hypertension Brother   . Hypertension Brother   . Hypertension Brother   . Hypertension Brother   . Hypertension Sister    Outpatient Encounter Medications as of 12/12/2017  Medication Sig Note  . acetaminophen (TYLENOL) 325 MG tablet Take 325-650 mg by mouth every 6 (six) hours as needed for mild pain, fever or headache.    . albuterol (PROVENTIL HFA;VENTOLIN HFA) 108 (90 Base) MCG/ACT inhaler Inhale 1-2 puffs into the lungs every 6 (six) hours as needed for wheezing or shortness of breath.   . allopurinol (ZYLOPRIM) 300 MG tablet Take 1 tablet (300 mg total) by mouth daily.   Marland Kitchen aspirin EC 81 MG tablet Take 1 tablet (81 mg total) by mouth daily.   . Cholecalciferol (VITAMIN D3) 5000 units CAPS Take 5,000 Units by mouth daily.   . cyclobenzaprine (FLEXERIL) 10 MG tablet Take 1 tablet (10 mg total) by mouth 3 (three) times daily as needed for muscle spasms.   . famotidine (PEPCID) 20 MG tablet Take 1 tablet (20 mg total) by mouth 2 (two) times daily.   . ferrous sulfate 325 (65 FE) MG tablet Take 325 mg by mouth daily with breakfast.   . fluticasone (FLONASE) 50 MCG/ACT nasal spray Place 2 sprays into both nostrils daily as needed for allergies or rhinitis.   .  furosemide (LASIX) 40 MG tablet Take 1 tablet (40 mg total) by mouth 2 (two) times daily.   . Liraglutide (VICTOZA) 18 MG/3ML SOLN injection Inject 1.8 mLs (10.8 mg total) into the skin daily. 08/21/2017: Regimen verified by the patient  . meloxicam (MOBIC) 7.5 MG tablet Take 2 tablets (15 mg total) by mouth daily as needed for pain.   . montelukast (SINGULAIR) 10 MG tablet Take 1 tablet (10 mg total) by mouth at bedtime.   . Multiple Vitamins-Minerals (CENTRUM SILVER 50+WOMEN) TABS Take 1 tablet by mouth daily.   Marland Kitchen NIFEdipine (PROCARDIA-XL/ADALAT CC) 60 MG 24 hr tablet Take 1 tablet (60 mg total) by mouth daily.   . potassium chloride SA (K-DUR,KLOR-CON) 20 MEQ tablet Take 1 tablet (20 mEq total) by mouth daily.   . Probiotic Product (PROBIOTIC DAILY PO) Take 1 capsule by mouth daily.   . rosuvastatin (CRESTOR) 10 MG tablet Take 1 tablet (10 mg total) by mouth daily. (Patient taking differently: Take 10 mg by mouth at bedtime. )   . EPINEPHrine 0.3 mg/0.3 mL IJ SOAJ injection Inject 0.3 mLs (0.3 mg total) into the muscle as needed (acute resp failure/worsening anaphylaxis). (Patient not taking: Reported on 12/02/2017) 08/21/2017: Patient needs a new rxfor this, please; original order was never phoned  in (??)   No facility-administered encounter medications on file as of 12/12/2017.    To renew her prescriptions, she will need to fill out paperwork. She will reschedule with Inez Catalina to complete this process.     Lifestyle Diet: Breakfast:Doesn't eat because she wakes up too late Lunch: [place] sandwich  Dinner: Fish and vegetables. The fish will usually be grilled. Her sister usually cooks. Doesn't like fried fish Drinks: A lot of water and fruit juices   Exercise: Patient started walking yesterday. She wasn't sure for how long but she said it was at least 38mins.             Social History   Substance and Sexual Activity  Alcohol Use No      Social History   Tobacco Use  Smoking  Status Never Smoker  Smokeless Tobacco Never Used      Health Maintenance  Topic Date Due  . OPHTHALMOLOGY EXAM  02/11/1975  . PAP SMEAR  02/10/1986  . COLONOSCOPY  02/11/2015  . MAMMOGRAM  10/23/2017  . PNEUMOCOCCAL POLYSACCHARIDE VACCINE (1) 12/03/2018 (Originally 02/11/1967)  . TETANUS/TDAP  05/12/2027 (Originally 02/11/1984)  . INFLUENZA VACCINE  02/23/2018  . HEMOGLOBIN A1C  06/04/2018  . FOOT EXAM  12/03/2018  . URINE MICROALBUMIN  12/03/2018  . HIV Screening  Completed     Assessment and Plan:  Medication Adherence/Compliance: Patient knew what her medications were with some prompting. Could not fully establish compliance because patient's ride had to be at work soon. She said she had a few days left of medication. Additionally, patient has not had her medications filled here since January so question compliance. Per Inez Catalina, she needed to fill out paperwork in order for [place] to refill her meds. She has scheduled an appt with Inez Catalina for this process.  HTN/ASCVD: Patient is on nifedipine 60mg  daily and Lasix 40mg  BID. Patient is also taking ASA 81mg , Crestor 10mg  and KCl 5mEq daily. Patient is not on ACEI because of allergy with angioedema.  Patient says she takes her meds and has a few days left. Will refill meds after completion of paperwork. Patient reports feeling sleepy but no other side effects at this time.   Lab Results  Component Value Date   CHOL 167 08/22/2017   HDL 58 08/22/2017   LDLCALC 39 08/22/2017   TRIG 350 (H) 08/22/2017   CHOLHDL 2.9 08/22/2017   Diabetes: Patient takes Victoza 18mg /39mL. Now is taking 1.77mL daily. Reports no side effects. Patient states that she has some left over from when she had insurance, however, she has been getting her medications filled through [place]. Per Elmer Picker Note on 07/29/2017 we were unable to order Victoza because she never returned the paperwork for PAP. Per med rec, listed that patient got medication at [facility] in January  2019. Last Hgb A1c from 12/02/17 was 6.8.   Gout: On allopurinol 300mg  daily. Drinks water throughout the day. Patient reports no side effects at this time  Asthma/Seasonal Allergies: Singulair 10mg  at bedtime, Flonase, and Albuterol inhaler. Patient reports no side effects at this time  GERD: famotidine 20mg  BID. Patient reports no side effects at this time  Pain/other: ferrous sulfate for anemia. Flexeril 10mg  three times daily and meloxicam are both used for hip pain. She was supposed to get an Epipen but has not received it yet. Mentioned she needed the EpiPen in case she was "stung by something outside."  We do need a prescription from her doctor.   We have requested  to see the patient back in 6 months to establish progress and any changes to medications.  [facility] [place] PharmD Candidate [phone]

## 2017-12-23 ENCOUNTER — Telehealth: Payer: Self-pay | Admitting: Pharmacy Technician

## 2017-12-23 NOTE — Telephone Encounter (Signed)
Received updated proof of income.  Patient eligible to receive medication assistance at [facility] through 2019, as long as eligibility requirements continue to be met.  [facility] [facility]

## 2018-01-02 ENCOUNTER — Ambulatory Visit: Payer: Self-pay

## 2018-01-02 ENCOUNTER — Ambulatory Visit (INDEPENDENT_AMBULATORY_CARE_PROVIDER_SITE_OTHER): Payer: Self-pay | Admitting: Family Medicine

## 2018-01-02 ENCOUNTER — Encounter: Payer: Self-pay | Admitting: Family Medicine

## 2018-01-02 VITALS — BP 144/96 | HR 82 | Temp 98.3°F | Ht 65.0 in | Wt 288.0 lb

## 2018-01-02 DIAGNOSIS — I10 Essential (primary) hypertension: Secondary | ICD-10-CM

## 2018-01-02 DIAGNOSIS — M545 Low back pain, unspecified: Secondary | ICD-10-CM

## 2018-01-02 DIAGNOSIS — R0602 Shortness of breath: Secondary | ICD-10-CM

## 2018-01-02 DIAGNOSIS — Z6841 Body Mass Index (BMI) 40.0 and over, adult: Secondary | ICD-10-CM

## 2018-01-02 DIAGNOSIS — E781 Pure hyperglyceridemia: Secondary | ICD-10-CM

## 2018-01-02 DIAGNOSIS — R600 Localized edema: Secondary | ICD-10-CM

## 2018-01-02 DIAGNOSIS — E786 Lipoprotein deficiency: Secondary | ICD-10-CM

## 2018-01-02 DIAGNOSIS — R7982 Elevated C-reactive protein (CRP): Secondary | ICD-10-CM

## 2018-01-02 DIAGNOSIS — Z0000 Encounter for general adult medical examination without abnormal findings: Secondary | ICD-10-CM

## 2018-01-02 DIAGNOSIS — E119 Type 2 diabetes mellitus without complications: Secondary | ICD-10-CM

## 2018-01-02 DIAGNOSIS — R609 Edema, unspecified: Secondary | ICD-10-CM

## 2018-01-02 DIAGNOSIS — Z09 Encounter for follow-up examination after completed treatment for conditions other than malignant neoplasm: Secondary | ICD-10-CM

## 2018-01-02 NOTE — Progress Notes (Signed)
Subjective:    Patient ID: Daphna Lafuente, female    DOB: 28-Apr-1966, 52 y.o.   MRN: [id number]   PCP: Kathe Becton, NP  Chief Complaint  Patient presents with  . Follow-up    1 month on Lasix    HPI  Ms. Goldsmith has a history of Hypertension, Hyperlipidemia, Gout, Diabetes, and Asthma  Current Status: Since her last office visit, she states that she has been doing well. She states that she is taking all prescribed medications as directed. She denies increased thirst, frequent urination, hunger, fatigue, blurred vision, excessive hunger, excessive thirst, weight gain, weight loss, and poor wound healing. She has neuropathy in her feet.   She denies visual changes, chest pain, heart palpitations, and falls. She has  occasionally headaches and dizziness with position changes. She has chronic peripheral edema, which prevent her from being as active as he should be.   She has hot flashes.   She denies any signs of infection. She denies any GI symptoms.  She has occasional shortness of breath. She denies anxiety. She has generalized joint pain.    Review of Systems  Constitutional: Negative.   HENT: Negative.   Eyes: Negative.   Respiratory: Negative.   Cardiovascular: Negative.   Gastrointestinal: Positive for abdominal distention (obese) and constipation.  Endocrine: Negative.   Genitourinary: Negative.   Musculoskeletal:       Peripheral edema ---generalized.   Skin: Negative.   Allergic/Immunologic: Negative.   Neurological: Negative.   Hematological: Negative.   Psychiatric/Behavioral: Negative.    Objective:   Physical Exam  Constitutional: She is oriented to person, place, and time. She appears well-developed and well-nourished.  HENT:  Head: Normocephalic and atraumatic.  Right Ear: External ear normal.  Left Ear: External ear normal.  Nose: Nose normal.  Mouth/Throat: Oropharynx is clear and moist.  Eyes: Pupils are equal, round, and reactive to light.  Conjunctivae and EOM are normal.  Neck: Normal range of motion. Neck supple.  Cardiovascular: Normal rate, regular rhythm, normal heart sounds and intact distal pulses.  Pulmonary/Chest: Effort normal and breath sounds normal.  Abdominal: Soft. Bowel sounds are normal. She exhibits distension (Obese).  Musculoskeletal: She exhibits edema (generalized over entire body).  Neurological: She is alert and oriented to person, place, and time.  Skin: Skin is warm and dry. Capillary refill takes less than 2 seconds.  Psychiatric: She has a normal mood and affect. Her behavior is normal. Judgment and thought content normal.  Nursing note and vitals reviewed.  Assessment & Plan:   1. Diabetes mellitus without complication (HCC) Hgb Z0C was stable at 6.8 on 12/02/2017. No dose adjustments today. She will continue Victoza injection as prescribed. Monitor.   2. Essential hypertension Blood pressure is 144/96 today. She will continue BP meds as directed.  - furosemide (LASIX) 40 MG tablet; Take 1 tablet (40 mg total) by mouth 2 (two) times daily.  Dispense: 60 tablet; Refill: 3 - NIFEdipine (PROCARDIA-XL/ADALAT CC) 60 MG 24 hr tablet; Take 1 tablet (60 mg total) by mouth daily.  Dispense: 90 tablet; Refill: 1  3. Morbid obesity with BMI of 45.0-49.9, adult ([place]) Encouraged efforts to reduce weight include engaging in physical activity as tolerated with goal of 150 minutes per week. Improve dietary choices and eat a meal regimen consistent with a Mediterranean or DASH diet. Reduce simple carbohydrates. Do not skip meals and eat healthy snacks throughout the day to avoid over-eating at dinner. Set a goal weight loss that is achievable for  you.   4. Shortness of breath - montelukast (SINGULAIR) 10 MG tablet; Take 1 tablet (10 mg total) by mouth at bedtime.  Dispense: 30 tablet; Refill: 3  5. Health care maintenance We will draw C-reactive protein level to evaluate cause of peripheral edema.  - C-reactive  protein  6. Peripheral edema - furosemide (LASIX) 40 MG tablet; Take 1 tablet (40 mg total) by mouth 2 (two) times daily.  Dispense: 60 tablet; Refill: 3  7. Bilateral low back pain without sciatica, unspecified chronicity - cyclobenzaprine (FLEXERIL) 10 MG tablet; Take 1 tablet (10 mg total) by mouth 3 (three) times daily as needed for muscle spasms.  Dispense: 90 tablet; Refill: 1 - meloxicam (MOBIC) 7.5 MG tablet; Take 2 tablets (15 mg total) by mouth daily as needed for pain.  Dispense: 30 tablet; Refill: 3  8. 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  Dispense: 30 tablet; Refill: 3  9. Elevated C-reactive protein (CRP) Will draw lab today to assess for inflammation.   10. Follow up She will follow up in 1 month.   Meds ordered this encounter  Medications  . allopurinol (ZYLOPRIM) 300 MG tablet    Sig: Take 1 tablet (300 mg total) by mouth daily.    Dispense:  90 tablet    Refill:  1  . cyclobenzaprine (FLEXERIL) 10 MG tablet    Sig: Take 1 tablet (10 mg total) by mouth 3 (three) times daily as needed for muscle spasms.    Dispense:  90 tablet    Refill:  1  . furosemide (LASIX) 40 MG tablet    Sig: Take 1 tablet (40 mg total) by mouth 2 (two) times daily.    Dispense:  60 tablet    Refill:  3  . meloxicam (MOBIC) 7.5 MG tablet    Sig: Take 2 tablets (15 mg total) by mouth daily as needed for pain.    Dispense:  30 tablet    Refill:  3  . montelukast (SINGULAIR) 10 MG tablet    Sig: Take 1 tablet (10 mg total) by mouth at bedtime.    Dispense:  30 tablet    Refill:  3  . NIFEdipine (PROCARDIA-XL/ADALAT CC) 60 MG 24 hr tablet    Sig: Take 1 tablet (60 mg total) by mouth daily.    Dispense:  90 tablet    Refill:  1    Dose change  . rosuvastatin (CRESTOR) 10 MG tablet    Sig: Take 1 tablet (10 mg  total) by mouth daily.    Dispense:  30 tablet    Refill:  [place],  MSN, FNP-BC [facility] [address] [place], [place] [address] [phone]

## 2018-01-03 ENCOUNTER — Telehealth: Payer: Self-pay

## 2018-01-03 LAB — C-REACTIVE PROTEIN: CRP: 14.6 mg/L — ABNORMAL HIGH (ref 0.0–4.9)

## 2018-01-04 ENCOUNTER — Encounter: Payer: Self-pay | Admitting: Family Medicine

## 2018-01-04 MED ORDER — MELOXICAM 7.5 MG PO TABS: 15.0000 mg | ORAL_TABLET | Freq: Every day | ORAL | 3 refills | Status: DC | PRN

## 2018-01-04 MED ORDER — MONTELUKAST SODIUM 10 MG PO TABS: 10.0000 mg | ORAL_TABLET | Freq: Every day | ORAL | 3 refills | Status: DC

## 2018-01-04 MED ORDER — FUROSEMIDE 40 MG PO TABS: 40.0000 mg | ORAL_TABLET | Freq: Two times a day (BID) | ORAL | 3 refills | Status: DC

## 2018-01-04 MED ORDER — NIFEDIPINE ER 60 MG PO TB24: 60.0000 mg | ORAL_TABLET | Freq: Every day | ORAL | 1 refills | Status: DC

## 2018-01-04 MED ORDER — ALLOPURINOL 300 MG PO TABS: 300.0000 mg | ORAL_TABLET | Freq: Every day | ORAL | 1 refills | Status: DC

## 2018-01-04 MED ORDER — ROSUVASTATIN CALCIUM 10 MG PO TABS: 10.0000 mg | ORAL_TABLET | Freq: Every day | ORAL | 3 refills | Status: DC

## 2018-01-04 MED ORDER — CYCLOBENZAPRINE HCL 10 MG PO TABS: 10.0000 mg | ORAL_TABLET | Freq: Three times a day (TID) | ORAL | 1 refills | Status: DC | PRN

## 2018-01-09 ENCOUNTER — Telehealth: Payer: Self-pay | Admitting: Pharmacist

## 2018-01-09 ENCOUNTER — Other Ambulatory Visit: Payer: Self-pay | Admitting: Internal Medicine

## 2018-01-09 ENCOUNTER — Ambulatory Visit: Payer: Self-pay | Admitting: Pharmacy Technician

## 2018-01-09 DIAGNOSIS — Z79899 Other long term (current) drug therapy: Secondary | ICD-10-CM

## 2018-01-09 NOTE — Progress Notes (Signed)
Patient requested blood pressure check.  Nancy Pham, [facility] performed blood pressure check.  Blood pressure elevated. Patient instructed to follow-up with Molli Barrows.  Refilling medications for patient.    Victoza Prescription Application completed with patient.  Forwarded to [facility] for signature.  Upon receipt of signed application from provider, Victoza Prescription Application will be submitted to [employer].  [place] [facility]

## 2018-01-09 NOTE — Telephone Encounter (Signed)
Nancy Pham came in today for an eligibility appointment. All of her documentation is complete and she is now eligible to receive medication assistance from [facility]).  She was seen last week by her primary care provider. Prescriptions were electonically sent to a [facility] in [place]. Patient states that she has been out of her medications for about a week and her blood pressure was elevated at last week's appointment. Per patient's recollection, 160/120 mmHg. Today her BP was 180/110 mmHg; she complained of a headache. Prescriptions were transferred from [facility] and filled at [facility] today.  PLAN: Recommended patient be seen in the Urgent Care or ER due to elevated blood pressure. Patient refused. Patient took the nifedipine in the clinic today before she left. Medications filled today: Allopurinol, Nifedipine ER, Rosuvastatin, Meloxicam, Furosemide, Montelukast and Cyclobenzaprine.  Nancy Pham K. Dicky Doe, PharmD [facility] [place] Operations Coordinator [phone]

## 2018-01-10 ENCOUNTER — Other Ambulatory Visit: Payer: Self-pay | Admitting: Internal Medicine

## 2018-02-01 ENCOUNTER — Ambulatory Visit: Payer: Self-pay | Admitting: Family Medicine

## 2018-05-26 ENCOUNTER — Other Ambulatory Visit: Payer: Self-pay

## 2018-05-26 DIAGNOSIS — M545 Low back pain, unspecified: Secondary | ICD-10-CM

## 2018-05-26 MED ORDER — MELOXICAM 7.5 MG PO TABS: 15.0000 mg | ORAL_TABLET | Freq: Every day | ORAL | 1 refills | Status: DC | PRN

## 2018-05-26 MED ORDER — POTASSIUM CHLORIDE CRYS ER 20 MEQ PO TBCR: 20.0000 meq | EXTENDED_RELEASE_TABLET | Freq: Every day | ORAL | 1 refills | Status: DC

## 2018-05-26 NOTE — Telephone Encounter (Signed)
Medication sent to pharmacy  

## 2018-06-19 ENCOUNTER — Other Ambulatory Visit: Payer: Self-pay | Admitting: Internal Medicine

## 2018-06-19 ENCOUNTER — Encounter (INDEPENDENT_AMBULATORY_CARE_PROVIDER_SITE_OTHER): Payer: Self-pay

## 2018-06-19 ENCOUNTER — Other Ambulatory Visit: Payer: Self-pay

## 2018-06-19 ENCOUNTER — Ambulatory Visit: Payer: Self-pay | Admitting: Pharmacist

## 2018-06-19 VITALS — BP 118/68

## 2018-06-19 DIAGNOSIS — Z79899 Other long term (current) drug therapy: Secondary | ICD-10-CM

## 2018-06-19 DIAGNOSIS — E781 Pure hyperglyceridemia: Secondary | ICD-10-CM

## 2018-06-19 DIAGNOSIS — E786 Lipoprotein deficiency: Principal | ICD-10-CM

## 2018-06-19 NOTE — Progress Notes (Signed)
  [facility]ication Management Clinic Visit Note  Patient: Nancy Pham MRN: [id number] Date of Birth: Dec 19, 1965 PCP: Azzie Glatter, FNP   Dellia Cloud 52 y.o. female presents for a medication therapy management visit today.  BP 118/68  Patient Information   Past Medical History:  Diagnosis Date  . Asthma   . Cancer ([place])   . Diabetes mellitus   . Gout   . Hyperlipidemia   . Hypertension       Past Surgical History:  Procedure Laterality Date  . ABDOMINAL HYSTERECTOMY       Family History  Problem Relation Age of Onset  . Seizures Mother   . Heart attack Father   . Heart attack Sister   . Hypertension Sister   . Hypertension Brother   . Hypertension Brother   . Hypertension Brother   . Hypertension Brother   . Hypertension Sister     New Diagnoses (since last visit):             Social History   Substance and Sexual Activity  Alcohol Use No      Social History   Tobacco Use  Smoking Status Never Smoker  Smokeless Tobacco Never Used      Health Maintenance  Topic Date Due  . PNEUMOCOCCAL POLYSACCHARIDE VACCINE AGE 32-64 HIGH RISK  02/11/1967  . OPHTHALMOLOGY EXAM  02/11/1975  . PAP SMEAR  02/10/1986  . COLONOSCOPY  02/11/2015  . MAMMOGRAM  10/23/2017  . INFLUENZA VACCINE  02/23/2018  . HEMOGLOBIN A1C  06/04/2018  . TETANUS/TDAP  05/12/2027 (Originally 02/11/1984)  . FOOT EXAM  12/03/2018  . URINE MICROALBUMIN  12/03/2018  . HIV Screening  Completed   06/19/2018    Assessment and Plan: Breakfast: skips Lunch: sandwiches Dinner: chicken, mashed potatoes, broccoli Drinks: water Exercise: walks around for ADLs  Health Maintenance/Date Completed Next Visit to PCP:12/3:  Assessment and Plan: Diabetes (Victoza): does not check sugars. Last A1c recorded was < 7 in 11/2017. Counseled to check at least twice a week before the first meal of the day. Counseled to be mindful of carbohydrate intake.  Hypertension: BP today was 118/68. Patient  has severe edema. Could be issue with Nifedipine.Will contact PCP for potential adverse issue with Nifedipine.  Hyperlipidemia (Crestor): no current issues. Takes Crestor at night.  Gout (Allopurinol): no current issues.  GERD (famotidine): did not realize she needed to take famotidine BID, counseled to do so.  Asthma (albuterol): patient has been sick but is improving. Has needed inhaler more during sickness, but states she was not using as regularly prior to sickness.  Patient is compliant with medications; states she only misses once/week, and does miss Lasix occasionally due to frequent urination if going out. Patient is well-educated on medical issues.  Paticia Stack, PharmD Pharmacy Resident  06/19/2018 2:44 PM

## 2018-06-27 ENCOUNTER — Encounter: Payer: Self-pay | Admitting: Family Medicine

## 2018-06-27 ENCOUNTER — Ambulatory Visit (INDEPENDENT_AMBULATORY_CARE_PROVIDER_SITE_OTHER): Payer: Self-pay | Admitting: Family Medicine

## 2018-06-27 VITALS — BP 160/80 | HR 90 | Temp 98.2°F | Resp 16 | Ht 65.0 in | Wt 295.0 lb

## 2018-06-27 DIAGNOSIS — I10 Essential (primary) hypertension: Secondary | ICD-10-CM

## 2018-06-27 DIAGNOSIS — E119 Type 2 diabetes mellitus without complications: Secondary | ICD-10-CM

## 2018-06-27 DIAGNOSIS — J40 Bronchitis, not specified as acute or chronic: Secondary | ICD-10-CM

## 2018-06-27 LAB — POCT URINALYSIS DIPSTICK
Bilirubin, UA: NEGATIVE
Blood, UA: NEGATIVE
Glucose, UA: POSITIVE — AB
Ketones, UA: NEGATIVE
Leukocytes, UA: NEGATIVE
Nitrite, UA: NEGATIVE
Protein, UA: POSITIVE — AB
Spec Grav, UA: 1.025 (ref 1.010–1.025)
Urobilinogen, UA: 1 E.U./dL
pH, UA: 6 (ref 5.0–8.0)

## 2018-06-27 LAB — POCT GLYCOSYLATED HEMOGLOBIN (HGB A1C): Hemoglobin A1C: 9.2 % — AB (ref 4.0–5.6)

## 2018-06-27 LAB — GLUCOSE, POCT (MANUAL RESULT ENTRY): POC Glucose: 259 mg/dl — AB (ref 70–99)

## 2018-06-27 MED ORDER — AZITHROMYCIN 250 MG PO TABS: ORAL_TABLET | ORAL | 0 refills | Status: DC

## 2018-06-27 MED ORDER — BENZONATATE 100 MG PO CAPS: 100.0000 mg | ORAL_CAPSULE | Freq: Two times a day (BID) | ORAL | 0 refills | Status: DC | PRN

## 2018-06-27 MED ORDER — IPRATROPIUM BROMIDE 0.02 % IN SOLN
0.5000 mg | Freq: Once | RESPIRATORY_TRACT | Status: AC
  Administered 2018-06-27: 0.5 mg via RESPIRATORY_TRACT

## 2018-06-27 MED ORDER — ALBUTEROL SULFATE (2.5 MG/3ML) 0.083% IN NEBU
2.5000 mg | INHALATION_SOLUTION | Freq: Once | RESPIRATORY_TRACT | Status: AC
  Administered 2018-06-27: 2.5 mg via RESPIRATORY_TRACT

## 2018-06-27 MED ORDER — PREDNISONE 20 MG PO TABS: 60.0000 mg | ORAL_TABLET | Freq: Every day | ORAL | 0 refills | Status: DC

## 2018-06-27 MED ORDER — GLIMEPIRIDE 2 MG PO TABS: 2.0000 mg | ORAL_TABLET | Freq: Every day | ORAL | 2 refills | Status: DC

## 2018-06-27 MED FILL — BENZONATATE 100 MG CAP: 100 | 10 days supply | Qty: 20 | Fill #0

## 2018-06-27 MED FILL — GLIMEPIRIDE 2 MG TABS: 2 | 30 days supply | Qty: 30 | Fill #0

## 2018-06-27 MED FILL — AZITHROMYCIN 250 MG TABLET: 250 | 5 days supply | Qty: 6 | Fill #0

## 2018-06-27 MED FILL — predniSONE 20 MG TABS: 20 | 3 days supply | Qty: 9 | Fill #0

## 2018-06-27 NOTE — Progress Notes (Signed)
Established Patient Office Visit  Subjective:  Patient ID: Nancy Pham, female    DOB: 07-15-1966  Age: 52 y.o. MRN: [id number]  CC:  Chief Complaint  Patient presents with  . Hypertension  . Diabetes  . Cough  . Hoarse    HPI Kamaiya Antilla presents for follow up on DM, HTN. Patient states that she has been hoarse for the past 2 weeks. + productive cough with dark yellow sputum.  Patient does not follow a low carb or low sodium diet. Patient does not exercise. Patient states that she has been drinking sweet fruit juice in the past few months. Patient states that she has been on victoza 1.8 for several years. Has tried metformin in the past with side effects.  Patient does not check her FBS.  Past Medical History:  Diagnosis Date  . Asthma   . Cancer ([place])   . Diabetes mellitus   . Gout   . Hyperlipidemia   . Hypertension     Past Surgical History:  Procedure Laterality Date  . ABDOMINAL HYSTERECTOMY      Family History  Problem Relation Age of Onset  . Seizures Mother   . Heart attack Father   . Heart attack Sister   . Hypertension Sister   . Hypertension Brother   . Hypertension Brother   . Hypertension Brother   . Hypertension Brother   . Hypertension Sister     Social History   Socioeconomic History  . Marital status: Legally Separated    Spouse name: Not on file  . Number of children: Not on file  . Years of education: Not on file  . Highest education level: Not on file  Occupational History  . Not on file  Social Needs  . Financial resource strain: Not on file  . Food insecurity:    Worry: Not on file    Inability: Not on file  . Transportation needs:    Medical: Not on file    Non-medical: Not on file  Tobacco Use  . Smoking status: Never Smoker  . Smokeless tobacco: Never Used  Substance and Sexual Activity  . Alcohol use: No  . Drug use: No  . Sexual activity: Not on file  Lifestyle  . Physical activity:    Days per week: Not on  file    Minutes per session: Not on file  . Stress: Not on file  Relationships  . Social connections:    Talks on phone: Not on file    Gets together: Not on file    Attends religious service: Not on file    Active member of club or organization: Not on file    Attends meetings of clubs or organizations: Not on file    Relationship status: Not on file  . Intimate partner violence:    Fear of current or ex partner: Not on file    Emotionally abused: Not on file    Physically abused: Not on file    Forced sexual activity: Not on file  Other Topics Concern  . Not on file  Social History Narrative  . Not on file    Outpatient Medications Prior to Visit  Medication Sig Dispense Refill  . acetaminophen (TYLENOL) 325 MG tablet Take 325-650 mg by mouth every 6 (six) hours as needed for mild pain, fever or headache.     . albuterol (PROVENTIL HFA;VENTOLIN HFA) 108 (90 Base) MCG/ACT inhaler Inhale 1-2 puffs into the lungs every 6 (six) hours as needed  for wheezing or shortness of breath. 8 g 2  . allopurinol (ZYLOPRIM) 300 MG tablet Take 1 tablet (300 mg total) by mouth daily. 90 tablet 1  . ASPIRIN LOW DOSE 81 MG EC tablet TAKE ONE TABLET BY MOUTH EVERY DAY 90 tablet 0  . Cholecalciferol (VITAMIN D3) 5000 units CAPS Take 5,000 Units by mouth daily.    . cyclobenzaprine (FLEXERIL) 10 MG tablet Take 1 tablet (10 mg total) by mouth 3 (three) times daily as needed for muscle spasms. 90 tablet 1  . EPINEPHrine 0.3 mg/0.3 mL IJ SOAJ injection Inject 0.3 mLs (0.3 mg total) into the muscle as needed (acute resp failure/worsening anaphylaxis). 3 Device 1  . famotidine (PEPCID) 20 MG tablet Take 1 tablet (20 mg total) by mouth 2 (two) times daily. (Patient taking differently: Take 20 mg by mouth 2 (two) times daily. Patient is taking once daily not BID) 14 tablet 0  . ferrous sulfate 325 (65 FE) MG tablet Take 325 mg by mouth daily with breakfast.    . fluticasone (FLONASE) 50 MCG/ACT nasal spray Place  2 sprays into both nostrils daily as needed for allergies or rhinitis.    . furosemide (LASIX) 40 MG tablet Take 1 tablet (40 mg total) by mouth 2 (two) times daily. 60 tablet 3  . Liraglutide (VICTOZA) 18 MG/3ML SOLN injection Inject 1.8 mLs (10.8 mg total) into the skin daily. 50 mL 0  . meloxicam (MOBIC) 7.5 MG tablet Take 2 tablets (15 mg total) by mouth daily as needed for pain. 30 tablet 1  . montelukast (SINGULAIR) 10 MG tablet Take 1 tablet (10 mg total) by mouth at bedtime. 30 tablet 3  . Multiple Vitamins-Minerals (CENTRUM SILVER 50+WOMEN) TABS Take 1 tablet by mouth daily.    Marland Kitchen NIFEdipine (PROCARDIA-XL/ADALAT CC) 60 MG 24 hr tablet Take 1 tablet (60 mg total) by mouth daily. 90 tablet 1  . potassium chloride SA (K-DUR,KLOR-CON) 20 MEQ tablet Take 1 tablet (20 mEq total) by mouth daily. 30 tablet 1  . Probiotic Product (PROBIOTIC DAILY PO) Take 1 capsule by mouth daily.    . rosuvastatin (CRESTOR) 10 MG tablet Take 1 tablet (10 mg total) by mouth daily. 30 tablet 3   No facility-administered medications prior to visit.     Allergies  Allergen Reactions  . Ace Inhibitors Swelling    Swelling of the lips  . Tape Rash    No Band-Aids!!    ROS Review of Systems  Constitutional: Negative.   HENT: Positive for sore throat and voice change.   Eyes: Negative.   Respiratory: Positive for cough, shortness of breath and wheezing.   Cardiovascular: Negative.   Gastrointestinal: Negative.   Endocrine: Negative.   Genitourinary: Negative.   Musculoskeletal: Negative.   Skin: Negative.   Allergic/Immunologic: Negative.   Neurological: Negative.   Hematological: Negative.   Psychiatric/Behavioral: Negative.       Objective:    Physical Exam  Constitutional: She is oriented to person, place, and time. She appears well-developed and well-nourished. No distress.  HENT:  Head: Normocephalic and atraumatic.  Mouth/Throat: Oropharynx is clear and moist. No oropharyngeal exudate  (Post nasal drip. ).  Eyes: Pupils are equal, round, and reactive to light. Conjunctivae and EOM are normal.  Neck: Normal range of motion.  Cardiovascular: Normal rate, regular rhythm and normal heart sounds.  Pulmonary/Chest: Effort normal. No respiratory distress. She has decreased breath sounds in the right middle field and the left middle field. She has no wheezes.  She has no rhonchi. She has rales.  Musculoskeletal: Normal range of motion.  Neurological: She is alert and oriented to person, place, and time.  Skin: Skin is warm and dry.  Psychiatric: She has a normal mood and affect. Her behavior is normal. Judgment and thought content normal.  Nursing note and vitals reviewed.   BP (!) 160/80 (BP Location: Right Arm, Patient Position: Sitting, Cuff Size: Large)   Pulse 90   Temp 98.2 F (36.8 C) (Oral)   Resp 16   Ht 5\' 5"  (1.651 m)   Wt 295 lb (133.8 kg)   SpO2 100%   BMI 49.09 kg/m  Wt Readings from Last 3 Encounters:  06/27/18 295 lb (133.8 kg)  01/02/18 288 lb (130.6 kg)  12/12/17 277 lb 8 oz (125.9 kg)     Health Maintenance Due  Topic Date Due  . PNEUMOCOCCAL POLYSACCHARIDE VACCINE AGE 59-64 HIGH RISK  02/11/1967  . OPHTHALMOLOGY EXAM  02/11/1975  . PAP SMEAR  02/10/1986  . COLONOSCOPY  02/11/2015  . MAMMOGRAM  10/23/2017  . INFLUENZA VACCINE  02/23/2018  . HEMOGLOBIN A1C  06/04/2018    There are no preventive care reminders to display for this patient.  No results found for: TSH Lab Results  Component Value Date   WBC 10.1 08/21/2017   HGB 12.9 08/21/2017   HCT 38.5 08/21/2017   MCV 87.7 08/21/2017   PLT 298 08/21/2017   Lab Results  Component Value Date   NA 141 12/02/2017   K 4.0 12/02/2017   CO2 26 12/02/2017   GLUCOSE 99 12/02/2017   BUN 9 12/02/2017   CREATININE 0.69 12/02/2017   BILITOT 0.4 09/26/2011   ALKPHOS 83 09/26/2011   AST 21 09/26/2011   ALT 15 09/26/2011   PROT 8.0 09/26/2011   ALBUMIN 3.9 09/26/2011   CALCIUM 9.6 12/02/2017    ANIONGAP 14 08/21/2017   Lab Results  Component Value Date   CHOL 167 08/22/2017   Lab Results  Component Value Date   HDL 58 08/22/2017   Lab Results  Component Value Date   LDLCALC 39 08/22/2017   Lab Results  Component Value Date   TRIG 350 (H) 08/22/2017   Lab Results  Component Value Date   CHOLHDL 2.9 08/22/2017   Lab Results  Component Value Date   HGBA1C 9.2 (A) 06/27/2018      Assessment & Plan:   Problem List Items Addressed This Visit      Cardiovascular and Mediastinum   Hypertension   Relevant Orders   Urinalysis Dipstick (Completed)     Endocrine   Diabetes mellitus without complication (HCC) - Primary   Relevant Medications   glimepiride (AMARYL) 2 MG tablet   Other Relevant Orders   Urinalysis Dipstick (Completed)   Glucose (CBG) (Completed)   HgB A1c (Completed)    Other Visit Diagnoses    Bronchitis       Relevant Medications   albuterol (PROVENTIL) (2.5 MG/3ML) 0.083% nebulizer solution 2.5 mg (Completed)   ipratropium (ATROVENT) nebulizer solution 0.5 mg (Completed)   benzonatate (TESSALON) 100 MG capsule   azithromycin (ZITHROMAX) 250 MG tablet   predniSONE (DELTASONE) 20 MG tablet      Meds ordered this encounter  Medications  . glimepiride (AMARYL) 2 MG tablet    Sig: Take 1 tablet (2 mg total) by mouth daily with breakfast.    Dispense:  30 tablet    Refill:  2  . albuterol (PROVENTIL) (2.5 MG/3ML) 0.083% nebulizer solution 2.5  mg  . ipratropium (ATROVENT) nebulizer solution 0.5 mg  . benzonatate (TESSALON) 100 MG capsule    Sig: Take 1 capsule (100 mg total) by mouth 2 (two) times daily as needed for cough.    Dispense:  20 capsule    Refill:  0  . azithromycin (ZITHROMAX) 250 MG tablet    Sig: Take as directed on pack.    Dispense:  6 tablet    Refill:  0  . predniSONE (DELTASONE) 20 MG tablet    Sig: Take 3 tablets (60 mg total) by mouth daily with breakfast for 3 days.    Dispense:  9 tablet    Refill:  0    Started Amaryl for DM management. Discussed diet and exercise.   Time Spent: 25 minutes face-to-face with this patient discussing problems, treatments, and answering patient's questions.    Follow-up: Return in about 4 weeks (around 07/25/2018) for Cl[facility]is Riley- new DM medication.    Lanae Boast, FNP

## 2018-06-27 NOTE — Patient Instructions (Addendum)
I sent your medications to [facility].  Please remember the key things that we discussed today.  1. No more fruit juice. If you have to drink juice, mix it half and half with WATER.  2. Check your blood sugars EVERY morning. No excuses.  3. Cut down on carbs (rice, bread, pasta, sugar, juice).  4. Move your body! Exercise at least 3 days a week.  Check out/ Google:  American Diabetes Association website.   Diabetes Mellitus and Nutrition When you have diabetes (diabetes mellitus), it is very important to have healthy eating habits because your blood sugar (glucose) levels are greatly affected by what you eat and drink. Eating healthy foods in the appropriate amounts, at about the same times every day, can help you:  Control your blood glucose.  Lower your risk of heart disease.  Improve your blood pressure.  Reach or maintain a healthy weight.  Every person with diabetes is different, and each person has different needs for a meal plan. Your health care provider may recommend that you work with a diet and nutrition specialist (dietitian) to make a meal plan that is best for you. Your meal plan may vary depending on factors such as:  The calories you need.  The medicines you take.  Your weight.  Your blood glucose, blood pressure, and cholesterol levels.  Your activity level.  Other health conditions you have, such as heart or kidney disease.  How do carbohydrates affect me? Carbohydrates affect your blood glucose level more than any other type of food. Eating carbohydrates naturally increases the amount of glucose in your blood. Carbohydrate counting is a method for keeping track of how many carbohydrates you eat. Counting carbohydrates is important to keep your blood glucose at a healthy level, especially if you use insulin or take certain oral diabetes medicines. It is important to know how many carbohydrates you can safely have in each meal. This is different  for every person. Your dietitian can help you calculate how many carbohydrates you should have at each meal and for snack. Foods that contain carbohydrates include:  Bread, cereal, rice, pasta, and crackers.  Potatoes and corn.  Peas, beans, and lentils.  Milk and yogurt.  Fruit and juice.  Desserts, such as cakes, cookies, ice cream, and candy.  How does alcohol affect me? Alcohol can cause a sudden decrease in blood glucose (hypoglycemia), especially if you use insulin or take certain oral diabetes medicines. Hypoglycemia can be a life-threatening condition. Symptoms of hypoglycemia (sleepiness, dizziness, and confusion) are similar to symptoms of having too much alcohol. If your health care provider says that alcohol is safe for you, follow these guidelines:  Limit alcohol intake to no more than 1 drink per day for nonpregnant women and 2 drinks per day for men. One drink equals 12 oz of beer, 5 oz of wine, or 1 oz of hard liquor.  Do not drink on an empty stomach.  Keep yourself hydrated with water, diet soda, or unsweetened iced tea.  Keep in mind that regular soda, juice, and other mixers may contain a lot of sugar and must be counted as carbohydrates.  What are tips for following this plan? Reading food labels  Start by checking the serving size on the label. The amount of calories, carbohydrates, fats, and other nutrients listed on the label are based on one serving of the food. Many foods contain more than one serving per package.  Check the total grams (g) of carbohydrates  in one serving. You can calculate the number of servings of carbohydrates in one serving by dividing the total carbohydrates by 15. For example, if a food has 30 g of total carbohydrates, it would be equal to 2 servings of carbohydrates.  Check the number of grams (g) of saturated and trans fats in one serving. Choose foods that have low or no amount of these fats.  Check the number of milligrams (mg)  of sodium in one serving. Most people should limit total sodium intake to less than 2,300 mg per day.  Always check the nutrition information of foods labeled as "low-fat" or "nonfat". These foods may be higher in added sugar or refined carbohydrates and should be avoided.  Talk to your dietitian to identify your daily goals for nutrients listed on the label. Shopping  Avoid buying canned, premade, or processed foods. These foods tend to be high in fat, sodium, and added sugar.  Shop around the outside edge of the grocery store. This includes fresh fruits and vegetables, bulk grains, fresh meats, and fresh dairy. Cooking  Use low-heat cooking methods, such as baking, instead of high-heat cooking methods like deep frying.  Cook using healthy oils, such as olive, canola, or sunflower oil.  Avoid cooking with butter, cream, or high-fat meats. Meal planning  Eat meals and snacks regularly, preferably at the same times every day. Avoid going long periods of time without eating.  Eat foods high in fiber, such as fresh fruits, vegetables, beans, and whole grains. Talk to your dietitian about how many servings of carbohydrates you can eat at each meal.  Eat 4-6 ounces of lean protein each day, such as lean meat, chicken, fish, eggs, or tofu. 1 ounce is equal to 1 ounce of meat, chicken, or fish, 1 egg, or 1/4 cup of tofu.  Eat some foods each day that contain healthy fats, such as avocado, nuts, seeds, and fish. Lifestyle   Check your blood glucose regularly.  Exercise at least 30 minutes 5 or more days each week, or as told by your health care provider.  Take medicines as told by your health care provider.  Do not use any products that contain nicotine or tobacco, such as cigarettes and e-cigarettes. If you need help quitting, ask your health care provider.  Work with a Social worker or diabetes educator to identify strategies to manage stress and any emotional and social challenges. What  are some questions to ask my health care provider?  Do I need to meet with a diabetes educator?  Do I need to meet with a dietitian?  What number can I call if I have questions?  When are the best times to check my blood glucose? Where to find more information:  American Diabetes Association: diabetes.org/food-and-fitness/food  Academy of Nutrition and Dietetics: PokerClues.dk  [employer] of Diabetes and Digestive and Kidney Diseases (NIH): ContactWire.be Summary  A healthy meal plan will help you control your blood glucose and maintain a healthy lifestyle.  Working with a diet and nutrition specialist (dietitian) can help you make a meal plan that is best for you.  Keep in mind that carbohydrates and alcohol have immediate effects on your blood glucose levels. It is important to count carbohydrates and to use alcohol carefully. This information is not intended to replace advice given to you by your health care provider. Make sure you discuss any questions you have with your health care provider. Document Released: 04/08/2005 Document Revised: 08/16/2016 Document Reviewed: 08/16/2016 Elsevier Interactive Patient  Education  2018 [place].  Acute Bronchitis, Adult Acute bronchitis is when air tubes (bronchi) in the lungs suddenly get swollen. The condition can make it hard to breathe. It can also cause these symptoms:  A cough.  Coughing up clear, yellow, or green mucus.  Wheezing.  Chest congestion.  Shortness of breath.  A fever.  Body aches.  Chills.  A sore throat.  Follow these instructions at home: Medicines  Take over-the-counter and prescription medicines only as told by your doctor.  If you were prescribed an antibiotic medicine, take it as told by your doctor. Do not stop taking the antibiotic even if you start to feel  better. General instructions  Rest.  Drink enough fluids to keep your pee (urine) clear or pale yellow.  Avoid smoking and secondhand smoke. If you smoke and you need help quitting, ask your doctor. Quitting will help your lungs heal faster.  Use an inhaler, cool mist vaporizer, or humidifier as told by your doctor.  Keep all follow-up visits as told by your doctor. This is important. How is this prevented? To lower your risk of getting this condition again:  Wash your hands often with soap and water. If you cannot use soap and water, use hand sanitizer.  Avoid contact with people who have cold symptoms.  Try not to touch your hands to your mouth, nose, or eyes.  Make sure to get the flu shot every year.  Contact a doctor if:  Your symptoms do not get better in 2 weeks. Get help right away if:  You cough up blood.  You have chest pain.  You have very bad shortness of breath.  You become dehydrated.  You faint (pass out) or keep feeling like you are going to pass out.  You keep throwing up (vomiting).  You have a very bad headache.  Your fever or chills gets worse. This information is not intended to replace advice given to you by your health care provider. Make sure you discuss any questions you have with your health care provider. Document Released: 12/29/2007 Document Revised: 02/18/2016 Document Reviewed: 12/31/2015 Elsevier Interactive Patient Education  [employer].

## 2018-06-28 ENCOUNTER — Other Ambulatory Visit: Payer: Self-pay

## 2018-06-28 DIAGNOSIS — E119 Type 2 diabetes mellitus without complications: Secondary | ICD-10-CM

## 2018-06-28 DIAGNOSIS — J40 Bronchitis, not specified as acute or chronic: Secondary | ICD-10-CM

## 2018-06-28 MED ORDER — BENZONATATE 100 MG PO CAPS: 100.0000 mg | ORAL_CAPSULE | Freq: Two times a day (BID) | ORAL | 0 refills | Status: DC | PRN

## 2018-06-28 MED ORDER — AZITHROMYCIN 250 MG PO TABS: ORAL_TABLET | ORAL | 0 refills | Status: DC

## 2018-06-28 MED ORDER — GLIMEPIRIDE 2 MG PO TABS: 2.0000 mg | ORAL_TABLET | Freq: Every day | ORAL | 2 refills | Status: DC

## 2018-06-28 MED ORDER — PREDNISONE 20 MG PO TABS: 60.0000 mg | ORAL_TABLET | Freq: Every day | ORAL | 0 refills | Status: AC

## 2018-07-12 ENCOUNTER — Telehealth: Payer: Self-pay

## 2018-07-12 NOTE — Telephone Encounter (Signed)
Anne Ng called from [facility] Management clinic in [place]  advised  Patient has not picked up ventolin since 08/2017 and they have not reordered it for her.

## 2018-07-25 ENCOUNTER — Encounter: Payer: Self-pay | Admitting: Family Medicine

## 2018-07-25 ENCOUNTER — Ambulatory Visit (INDEPENDENT_AMBULATORY_CARE_PROVIDER_SITE_OTHER): Payer: Self-pay | Admitting: Family Medicine

## 2018-07-25 VITALS — BP 144/86 | HR 90 | Temp 98.1°F | Ht 65.0 in | Wt 294.0 lb

## 2018-07-25 DIAGNOSIS — Z6841 Body Mass Index (BMI) 40.0 and over, adult: Secondary | ICD-10-CM

## 2018-07-25 DIAGNOSIS — Z09 Encounter for follow-up examination after completed treatment for conditions other than malignant neoplasm: Secondary | ICD-10-CM

## 2018-07-25 DIAGNOSIS — R600 Localized edema: Secondary | ICD-10-CM

## 2018-07-25 DIAGNOSIS — I10 Essential (primary) hypertension: Secondary | ICD-10-CM

## 2018-07-25 DIAGNOSIS — E119 Type 2 diabetes mellitus without complications: Secondary | ICD-10-CM

## 2018-07-25 DIAGNOSIS — E66813 Obesity, class 3: Secondary | ICD-10-CM

## 2018-07-25 DIAGNOSIS — R609 Edema, unspecified: Secondary | ICD-10-CM

## 2018-07-25 DIAGNOSIS — Z0000 Encounter for general adult medical examination without abnormal findings: Secondary | ICD-10-CM

## 2018-07-25 DIAGNOSIS — J029 Acute pharyngitis, unspecified: Secondary | ICD-10-CM

## 2018-07-25 LAB — POCT GLYCOSYLATED HEMOGLOBIN (HGB A1C): Hemoglobin A1C: 8.1 % — AB (ref 4.0–5.6)

## 2018-07-25 LAB — POCT URINALYSIS DIP (MANUAL ENTRY)
Bilirubin, UA: NEGATIVE
Blood, UA: NEGATIVE
Glucose, UA: NEGATIVE mg/dL
Ketones, POC UA: NEGATIVE mg/dL
Leukocytes, UA: NEGATIVE
Nitrite, UA: NEGATIVE
Protein Ur, POC: NEGATIVE mg/dL
Spec Grav, UA: 1.015 (ref 1.010–1.025)
Urobilinogen, UA: 0.2 E.U./dL
pH, UA: 7.5 (ref 5.0–8.0)

## 2018-07-25 LAB — GLUCOSE, POCT (MANUAL RESULT ENTRY): POC Glucose: 137 mg/dl — AB (ref 70–99)

## 2018-07-25 MED ORDER — EPINEPHRINE 0.3 MG/0.3ML IJ SOAJ: 0.3000 mg | INTRAMUSCULAR | 1 refills | Status: DC | PRN

## 2018-07-25 NOTE — Progress Notes (Signed)
Follow Up  Subjective:    Patient ID: Nancy Pham, female    DOB: 30-Jun-1966, 52 y.o.   MRN: [id number]   Chief Complaint  Patient presents with  . Follow-up    Chronic condition     HPI  Nancy Pham is a 52 year old female with a past medical history of Hypertension, Hyperlipidemia, Gout, Diabetes, [place]er, and Asthma. She is here today for follow up.   Current Status: Since her last office visit, she has complaints of pain with swallowing for a few days. She denies fatigue, frequent urination,  excessive hunger, excessive thirst, weight gain, weight loss, and poor wound healing. She denies chest pain, cough, shortness of breath, heart palpitations, and falls. She has occasionally headaches and dizziness with position changes. Denies severe headaches, confusion, seizures, double vision, and blurred vision, nausea and vomiting.  She denies fevers, chills, recent infections, weight loss, and night sweats. She has not had any falls. No chest pain, heart palpitations, cough and shortness of breath reported. No reports of GI problems such as diarrhea, and constipation. She has no reports of blood in stools, dysuria and hematuria. No depression or anxiety reported. She has mild throat pain today.  Review of Systems  Constitutional: Negative.   HENT: Positive for sore throat (increased) and trouble swallowing (Moderate).   Eyes: Negative.   Respiratory: Negative.   Cardiovascular: Negative.   Gastrointestinal: Negative.   Endocrine: Negative.   Genitourinary: Negative.   Musculoskeletal: Negative.   Skin: Negative.   Allergic/Immunologic: Negative.   Neurological: Positive for dizziness and headaches.  Hematological: Negative.   Psychiatric/Behavioral: Negative.    Objective:   Physical Exam Vitals signs and nursing note reviewed.  Constitutional:      Appearance: Normal appearance. She is obese. She is ill-appearing.  HENT:     Head: Normocephalic and atraumatic.     Right Ear:  External ear normal.     Left Ear: External ear normal.     Nose: Nose normal.     Mouth/Throat:     Mouth: Mucous membranes are moist.     Pharynx: Oropharynx is clear.   Eyes:     Conjunctiva/sclera: Conjunctivae normal.  Neck:     Musculoskeletal: Normal range of motion and neck supple.  Cardiovascular:     Rate and Rhythm: Normal rate and regular rhythm.     Pulses: Normal pulses.     Heart sounds: Normal heart sounds.  Pulmonary:     Effort: Pulmonary effort is normal.     Breath sounds: Normal breath sounds.  Abdominal:     General: Bowel sounds are normal.     Palpations: Abdomen is soft.  Musculoskeletal: Normal range of motion.  Skin:    General: Skin is warm and dry.     Capillary Refill: Capillary refill takes less than 2 seconds.  Neurological:     General: No focal deficit present.     Mental Status: She is alert and oriented to person, place, and time.  Psychiatric:        Mood and Affect: Mood normal.        Behavior: Behavior normal.        Thought Content: Thought content normal.        Judgment: Judgment normal.    Assessment & Plan:   1. Throat infection We will initiate Augmentin today. Counseled patient on increasing fluids and written material given to patient on management of throat infection. Patient verbalized understanding.   2. Type 2  diabetes mellitus without complication, without long-term current use of insulin (HCC) Improved. Hgb A1c is decreased at 8.1 today, from 9.2 on 06/27/2018. CBG is also decreased at 137 today, from 259 on 06/27/2018. Patient will continue Glimeperide and Victoza as prescribed. She will continue to decrease foods/beverages high in sugars and carbs and follow Heart Healthy or DASH diet. Increase physical activity to at least 30 minutes cardio exercise daily.  - POCT glycosylated hemoglobin (Hb A1C) - POCT urinalysis dipstick - POCT glucose (manual entry)  3. Essential hypertension Blood pressure is stable at 144/86  today. She will continue Nifedipine as prescribed. She will continue to decrease high sodium intake, excessive alcohol intake, increase potassium intake, smoking cessation, and increase physical activity of at least 30 minutes of cardio activity daily. She will continue to follow Heart Healthy or DASH diet.  4. Edema, peripheral Mild today. Not worsening. He will continue Lasix as prescribed. We will continue to monitor.   5. Class 3 severe obesity due to excess calories with serious comorbidity and body mass index (BMI) of 45.0 to 49.9 in adult [facility]) Body mass index is 48.92 kg/m. Goal BMI  is <30. Encouraged efforts to reduce weight include engaging in physical activity as tolerated with goal of 150 minutes per week. Improve dietary choices and eat a meal regimen consistent with a Mediterranean or DASH diet. Reduce simple carbohydrates. Do not skip meals and eat healthy snacks throughout the day to avoid over-eating at dinner. Set a goal weight loss that is achievable for you.  6. Healthcare maintenance  7. Follow up She will follow up in 3 months.   Meds ordered this encounter  Medications  . DISCONTD: EPINEPHrine 0.3 mg/0.3 mL IJ SOAJ injection    Sig: Inject 0.3 mLs (0.3 mg total) into the muscle as needed (acute resp failure/worsening anaphylaxis).    Dispense:  3 Device    Refill:  1  . amoxicillin-clavulanate (AUGMENTIN) 875-125 MG tablet    Sig: Take 1 tablet by mouth 2 (two) times daily for 7 days.    Dispense:  14 tablet    Refill:  0   Nancy Becton,  MSN, FNP-C [facility] [address] [place], [place] [address] [phone]

## 2018-07-25 NOTE — Patient Instructions (Addendum)
Tonsillitis  Tonsillitis is an infection of the throat. This infection causes the tonsils to become red, tender, and swollen. Tonsils are tissues in the back of your throat. If bacteria caused your infection, antibiotic medicine will be given to you. Sometimes, symptoms of this infection can be treated with the use of medicines that lessen swelling (steroids). If your tonsillitis is very bad (severe) and happens often, you may need to get your tonsils removed (tonsillectomy). Follow these instructions at home: Medicines  Take over-the-counter and prescription medicines only as told by your doctor.  If you were prescribed an antibiotic, take it as told by your doctor. Do not stop taking the antibiotic even if you start to feel better. Eating and drinking  Drink enough fluid to keep your pee (urine) clear or pale yellow.  While your throat is sore, eat soft or liquid foods like: ? Soup. ? Sherbert. ? Instant breakfast drinks.  Drink warm fluids.  Eat frozen ice pops. General instructions  Rest as much as possible and get plenty of sleep.  Gargle with a salt-water mixture 3-4 times a day or as needed. To make a salt-water mixture, completely dissolve -1 tsp of salt in 1 cup of warm water.  Wash your hands often with soap and water. If there is no soap and water, use hand sanitizer.  Do not share cups, bottles, or other utensils until your symptoms are gone.  Do not smoke. If you need help quitting, ask your doctor.  Keep all follow-up visits as told by your doctor. This is important. Contact a doctor if:  You have large, tender lumps in your neck.  You have a fever that does not go away after 2-3 days.  You have a rash.  You cough up green, yellow-brown, or bloody fluid.  You cannot swallow liquids or food for 24 hours.  Only one of your tonsils is swollen. Get help right away if:  You have any new symptoms such as: ? Vomiting. ? Very bad headache. ? Stiff  neck. ? Chest pain. ? Trouble breathing or swallowing.  You have very bad throat pain and you also have drooling or voice changes.  You have very bad pain that is not helped by medicine.  You cannot fully open your mouth.  You have redness, swelling, or severe pain anywhere in your neck. Summary  Tonsillitis causes your tonsils to be red, tender, and swollen.  While your throat is sore, eat soft or liquid foods.  Gargle with a salt-water mixture 3-4 times a day or as needed.  Do not share cups, bottles, or other utensils until your symptoms are gone. This information is not intended to replace advice given to you by your health care provider. Make sure you discuss any questions you have with your health care provider. Document Released: 12/29/2007 Document Revised: 08/17/2016 Document Reviewed: 08/17/2016 Elsevier Interactive Patient Education  2019 Elsevier Inc.    Amoxicillin; Clavulanic Acid tablets What is this medicine? AMOXICILLIN; CLAVULANIC ACID (a mox i SIL in; KLAV yoo lan ic AS id) is a penicillin antibiotic. It is used to treat certain kinds of bacterial infections. It will not work for colds, flu, or other viral infections. This medicine may be used for other purposes; ask your health care provider or pharmacist if you have questions. COMMON BRAND NAME(S): Augmentin What should I tell my health care provider before I take this medicine? They need to know if you have any of these conditions: -bowel disease, like  colitis -kidney disease -liver disease -mononucleosis -an unusual or allergic reaction to amoxicillin, penicillin, cephalosporin, other antibiotics, clavulanic acid, other medicines, foods, dyes, or preservatives -pregnant or trying to get pregnant -breast-feeding How should I use this medicine? Take this medicine by mouth with a full glass of water. Follow the directions on the prescription label. Take at the start of a meal. Do not crush or chew. If the  tablet has a score line, you may cut it in half at the score line for easier swallowing. Take your medicine at regular intervals. Do not take your medicine more often than directed. Take all of your medicine as directed even if you think you are better. Do not skip doses or stop your medicine early. Talk to your pediatrician regarding the use of this medicine in children. Special care may be needed. Overdosage: If you think you have taken too much of this medicine contact a poison control center or emergency room at once. NOTE: This medicine is only for you. Do not share this medicine with others. What if I miss a dose? If you miss a dose, take it as soon as you can. If it is almost time for your next dose, take only that dose. Do not take double or extra doses. What may interact with this medicine? -allopurinol -anticoagulants -birth control pills -methotrexate -probenecid This list may not describe all possible interactions. Give your health care provider a list of all the medicines, herbs, non-prescription drugs, or dietary supplements you use. Also tell them if you smoke, drink alcohol, or use illegal drugs. Some items may interact with your medicine. What should I watch for while using this medicine? Tell your doctor or health care professional if your symptoms do not improve. Do not treat diarrhea with over the counter products. Contact your doctor if you have diarrhea that lasts more than 2 days or if it is severe and watery. If you have diabetes, you may get a false-positive result for sugar in your urine. Check with your doctor or health care professional. Birth control pills may not work properly while you are taking this medicine. Talk to your doctor about using an extra method of birth control. What side effects may I notice from receiving this medicine? Side effects that you should report to your doctor or health care professional as soon as possible: -allergic reactions like skin  rash, itching or hives, swelling of the face, lips, or tongue -breathing problems -dark urine -fever or chills, sore throat -redness, blistering, peeling or loosening of the skin, including inside the mouth -seizures -trouble passing urine or change in the amount of urine -unusual bleeding, bruising -unusually weak or tired -white patches or sores in the mouth or throat Side effects that usually do not require medical attention (report to your doctor or health care professional if they continue or are bothersome): -diarrhea -dizziness -headache -nausea, vomiting -stomach upset -vaginal or anal irritation This list may not describe all possible side effects. Call your doctor for medical advice about side effects. You may report side effects to FDA at 1-800-FDA-1088. Where should I keep my medicine? Keep out of the reach of children. Store at room temperature below 25 degrees C (77 degrees F). Keep container tightly closed. Throw away any unused medicine after the expiration date. NOTE: This sheet is a summary. It may not cover all possible information. If you have questions about this medicine, talk to your doctor, pharmacist, or health care provider.  2019 Elsevier/Gold Standard (2007-10-05 12:04:30)

## 2018-07-27 ENCOUNTER — Telehealth: Payer: Self-pay

## 2018-07-27 ENCOUNTER — Telehealth: Payer: Self-pay | Admitting: Pharmacist

## 2018-07-27 ENCOUNTER — Other Ambulatory Visit: Payer: Self-pay

## 2018-07-27 DIAGNOSIS — Z0000 Encounter for general adult medical examination without abnormal findings: Secondary | ICD-10-CM

## 2018-07-27 MED ORDER — AMOXICILLIN-POT CLAVULANATE 875-125 MG PO TABS: 1.0000 | ORAL_TABLET | Freq: Two times a day (BID) | ORAL | 0 refills | Status: AC

## 2018-07-27 MED ORDER — EPINEPHRINE 0.3 MG/0.3ML IJ SOAJ: 0.3000 mg | INTRAMUSCULAR | 1 refills | Status: DC | PRN

## 2018-07-27 NOTE — Telephone Encounter (Signed)
Medication sent to pharmacy  

## 2018-07-27 NOTE — Telephone Encounter (Signed)
07/27/2018 9:15:42 AM - Epipen New Med  07/27/2018 Received a pharmacy printout for new med-Epipen 2 pack-Inject 1 pen (0.93mls) into the muscle as needed for acute respiratory failure/worsening anaphylaxis. Printed [employer] application - mailing patient her portion to sign & return, then I will send to [facility] for provider to sign (patient & provider both sign the same page).Delos Haring

## 2018-07-27 NOTE — Telephone Encounter (Signed)
Patient states that the pharmacy didn't have the antibiotic. Patient is aware that you are out of office and will return tomorrow.

## 2018-07-31 NOTE — Telephone Encounter (Signed)
Left a vm for patient

## 2018-08-01 NOTE — Telephone Encounter (Signed)
Tried to contact patient no answer 

## 2018-08-28 IMAGING — DX DG NECK SOFT TISSUE
2 series · 2 of 2 positions shown · non-contrast
Comparison: None.

CLINICAL DATA: Throat tightness.

EXAM:
NECK SOFT TISSUES - 1+ VIEW

[neck lat]
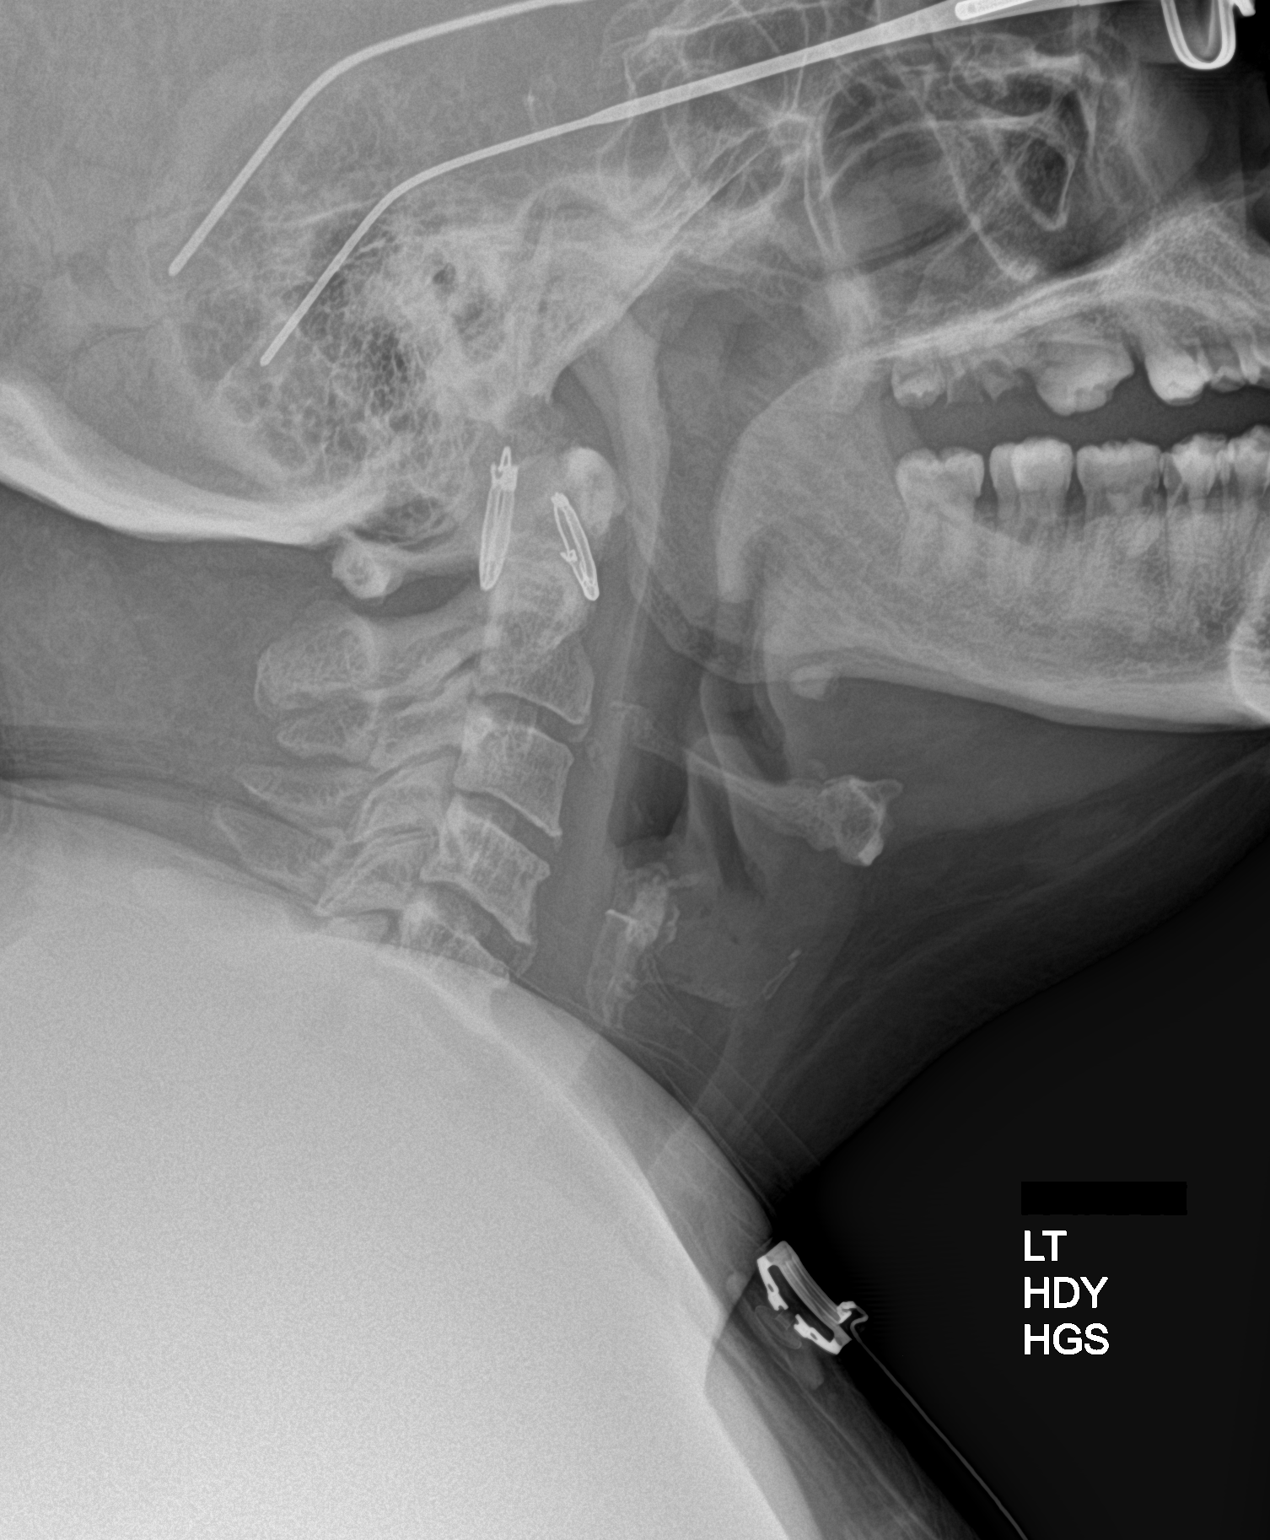

[neck ap]
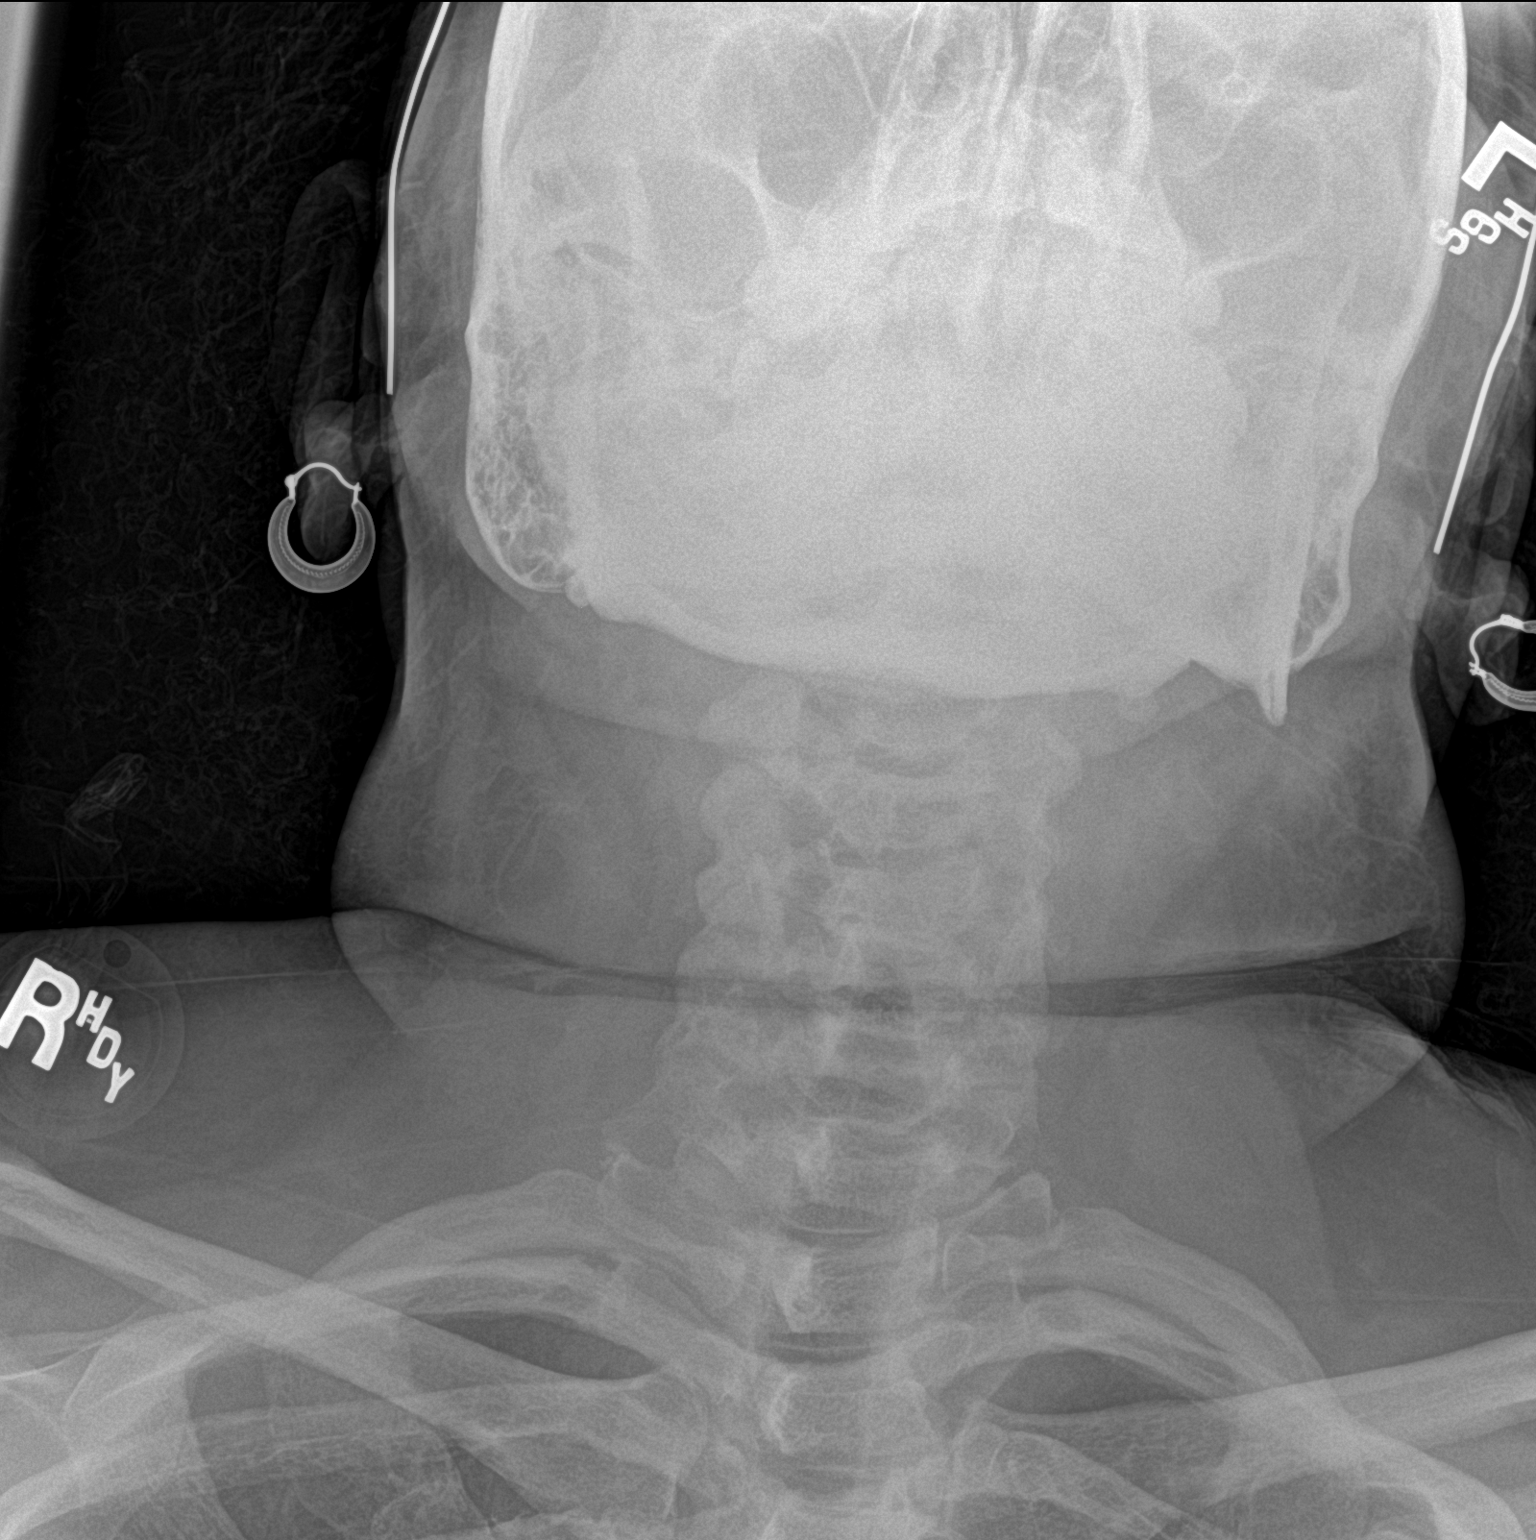

[2 of 2 positions shown; findings below may reference images not displayed]

FINDINGS: There is no evidence of retropharyngeal soft tissue swelling or
epiglottic enlargement. The cervical airway is unremarkable and no
radio-opaque foreign body identified.
IMPRESSION: Negative.

## 2018-10-17 ENCOUNTER — Other Ambulatory Visit: Payer: Self-pay | Admitting: Internal Medicine

## 2018-10-17 DIAGNOSIS — E119 Type 2 diabetes mellitus without complications: Secondary | ICD-10-CM

## 2018-10-17 DIAGNOSIS — M545 Low back pain, unspecified: Secondary | ICD-10-CM

## 2018-10-17 DIAGNOSIS — E781 Pure hyperglyceridemia: Secondary | ICD-10-CM

## 2018-10-17 DIAGNOSIS — E786 Lipoprotein deficiency: Secondary | ICD-10-CM

## 2018-10-17 MED ORDER — POTASSIUM CHLORIDE CRYS ER 20 MEQ PO TBCR: 20.0000 meq | EXTENDED_RELEASE_TABLET | Freq: Every day | ORAL | 1 refills | Status: DC

## 2018-10-17 MED ORDER — GLIMEPIRIDE 2 MG PO TABS: 2.0000 mg | ORAL_TABLET | Freq: Every day | ORAL | 2 refills | Status: DC

## 2018-10-26 ENCOUNTER — Telehealth: Payer: Self-pay | Admitting: Pharmacy Technician

## 2018-10-26 NOTE — Telephone Encounter (Signed)
Received 2020 proof of income.  Patient eligible to receive medication assistance at [facility] as long as eligibility requirements continue to be met.  [facility]

## 2018-10-27 ENCOUNTER — Ambulatory Visit: Payer: Self-pay | Admitting: Family Medicine

## 2018-11-30 NOTE — Telephone Encounter (Signed)
Message sent to provider 

## 2018-12-22 ENCOUNTER — Other Ambulatory Visit: Payer: Self-pay | Admitting: Gerontology

## 2018-12-22 ENCOUNTER — Other Ambulatory Visit: Payer: Self-pay

## 2018-12-22 DIAGNOSIS — R609 Edema, unspecified: Secondary | ICD-10-CM

## 2018-12-22 DIAGNOSIS — I10 Essential (primary) hypertension: Secondary | ICD-10-CM

## 2018-12-28 ENCOUNTER — Other Ambulatory Visit: Payer: Self-pay | Admitting: Internal Medicine

## 2018-12-28 DIAGNOSIS — R609 Edema, unspecified: Secondary | ICD-10-CM

## 2018-12-28 DIAGNOSIS — I10 Essential (primary) hypertension: Secondary | ICD-10-CM

## 2019-01-02 ENCOUNTER — Other Ambulatory Visit: Payer: Self-pay | Admitting: Internal Medicine

## 2019-01-02 DIAGNOSIS — I10 Essential (primary) hypertension: Secondary | ICD-10-CM

## 2019-01-02 DIAGNOSIS — R609 Edema, unspecified: Secondary | ICD-10-CM

## 2019-01-29 IMAGING — CR DG CHEST 2V
2 series · 2 of 2 positions shown · non-contrast
Comparison: 03/27/2017

CLINICAL DATA: Midline chest pain radiates under left breast.

EXAM:
CHEST  2 VIEW

[chest pa]
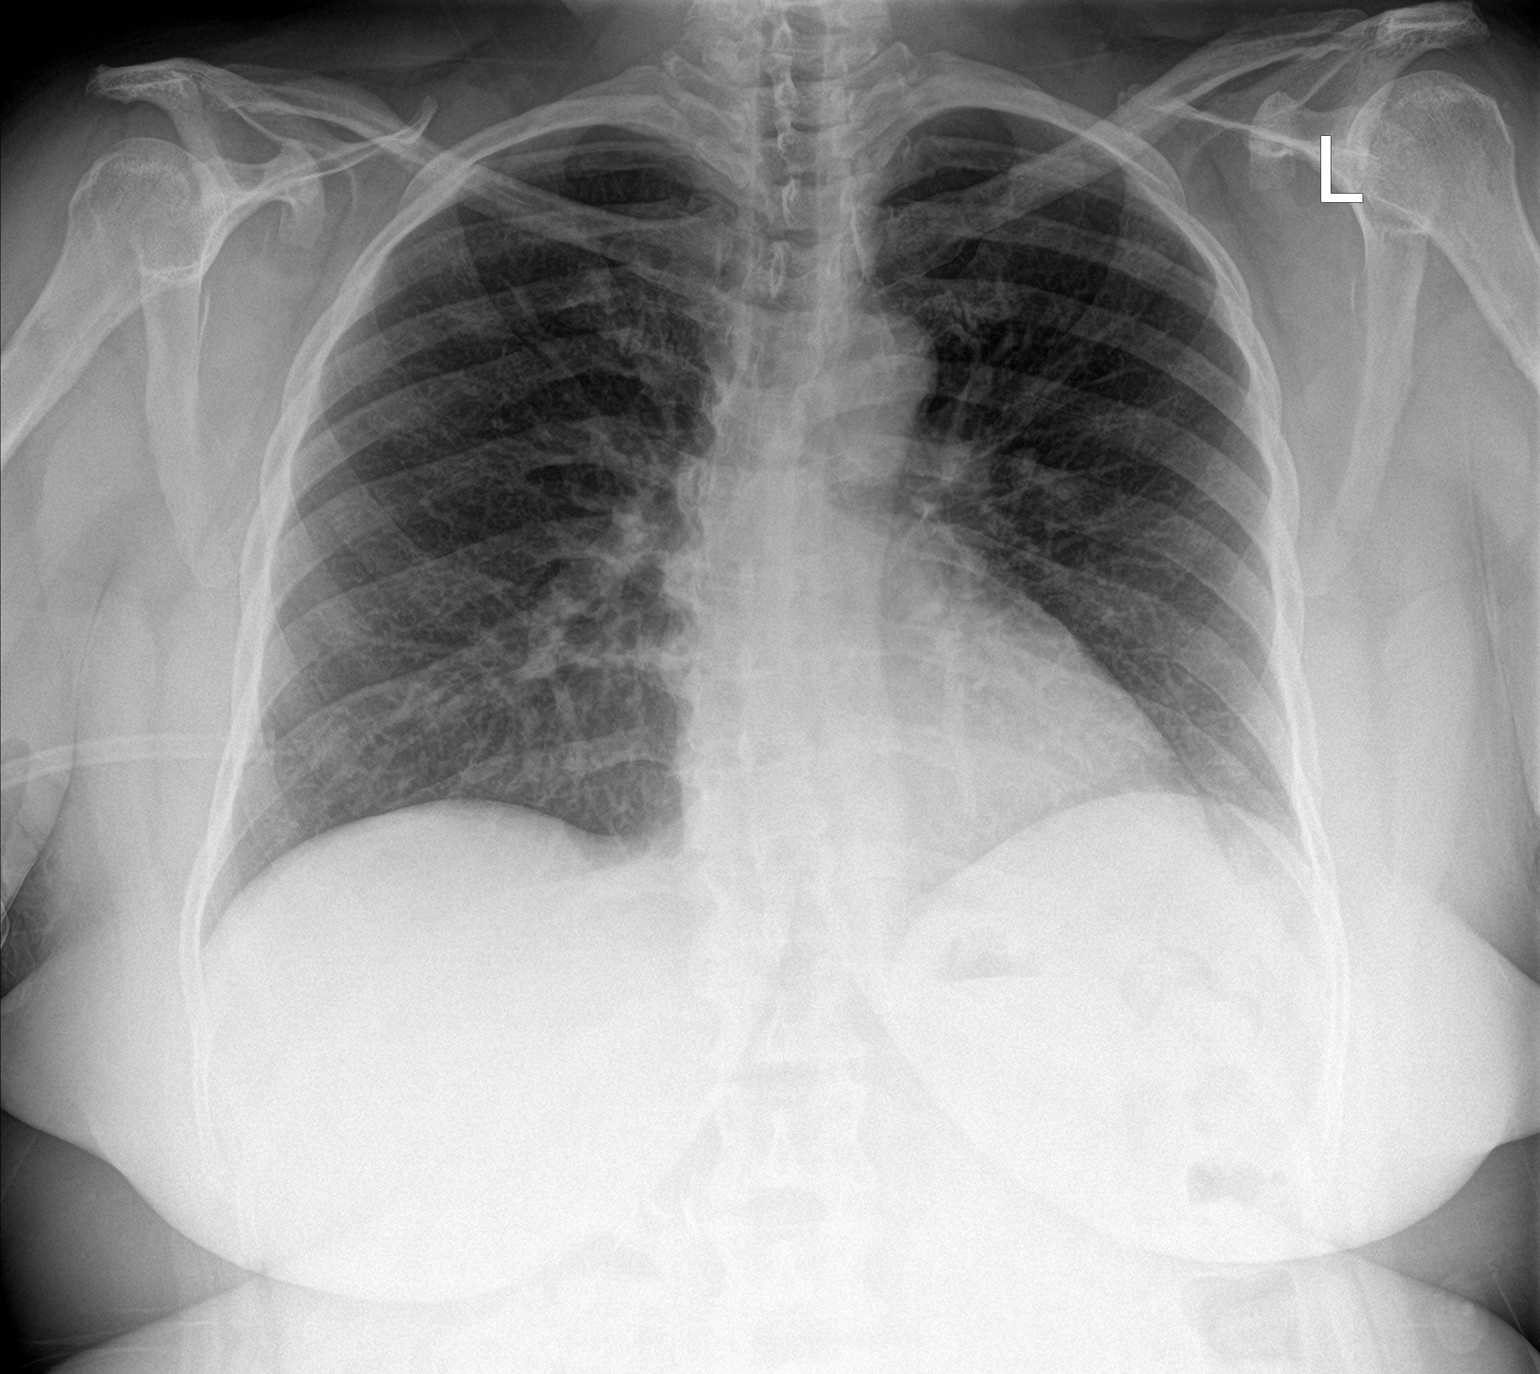

[chest lat]
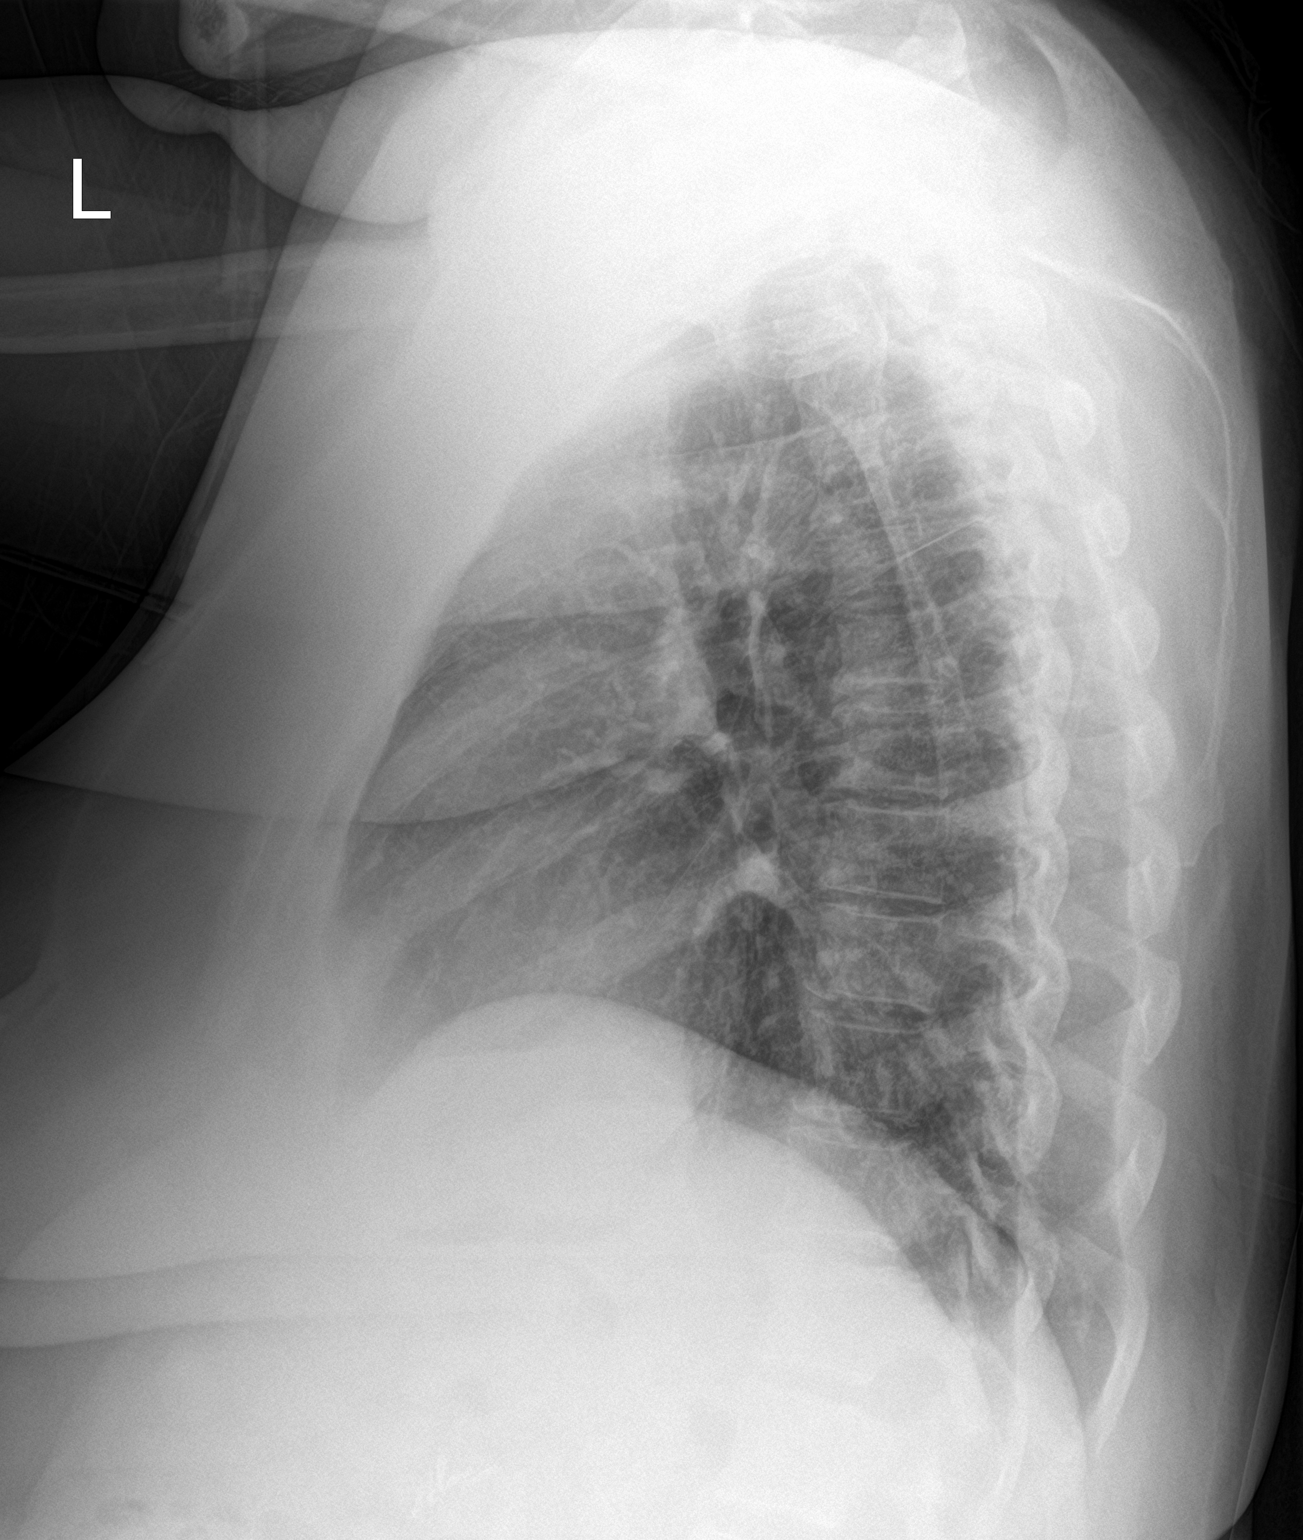

[2 of 2 positions shown; findings below may reference images not displayed]

FINDINGS: The lungs are clear without focal pneumonia, edema, pneumothorax or
pleural effusion. The cardiopericardial silhouette is within normal
limits for size. The visualized bony structures of the thorax are
intact.
IMPRESSION: No active cardiopulmonary disease.

## 2019-01-29 IMAGING — CT CT ANGIO CHEST
2 of 7 series · 18 of 46 positions shown · IV contrast (iopamidol)
Comparison: Chest radiograph 08/21/2017; chest radiograph
03/27/2017

CLINICAL DATA: Patient with centralized chest pain radiating to the
back. Elevated D-dimer. Evaluate for pulmonary embolus.

EXAM:
CT ANGIOGRAPHY CHEST WITH CONTRAST
TECHNIQUE: Multidetector CT imaging of the chest was performed using the
standard protocol during bolus administration of intravenous
contrast. Multiplanar CT image reconstructions and MIPs were
obtained to evaluate the vascular anatomy.
CONTRAST:  100mL YEJEGS-EII IOPAMIDOL (YEJEGS-EII) INJECTION 76%

[Series 7: thins · axial · 0.69mm/px · z∈[+1237,+1464]mm · 15 of 366 slices shown]
[im 21/366  lung]
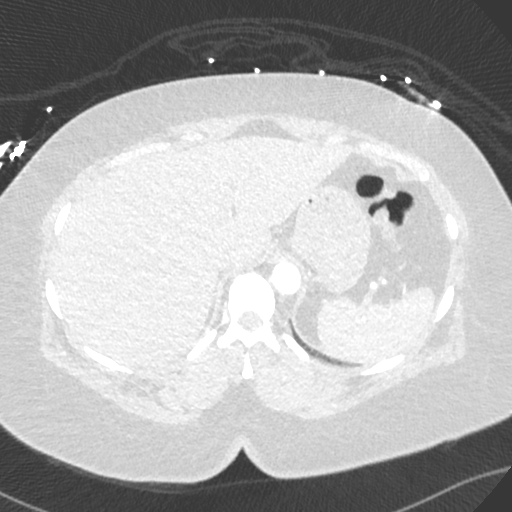
[im 41/366  soft-tissue]
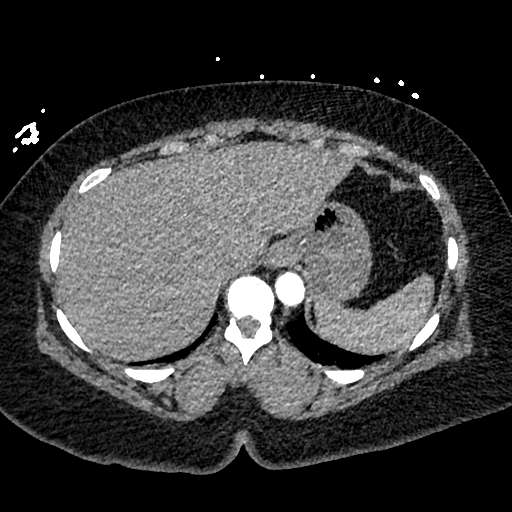
[im 61/366  lung]
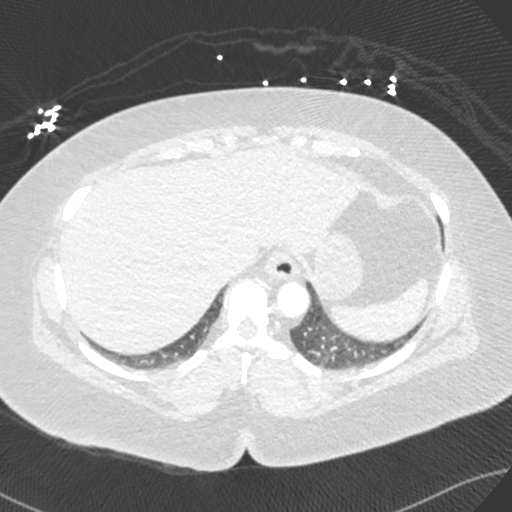
[im 82/366  soft-tissue]
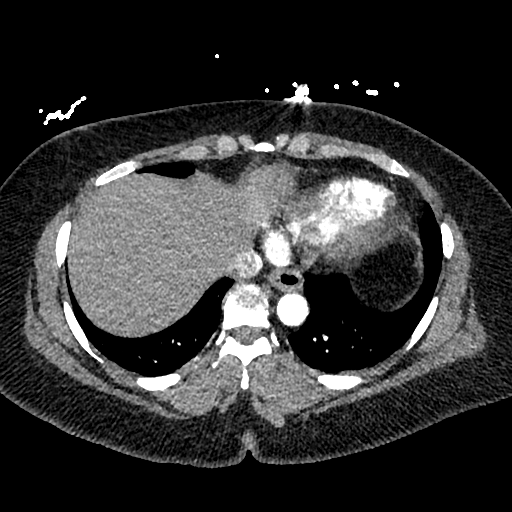
[im 122/366  lung]
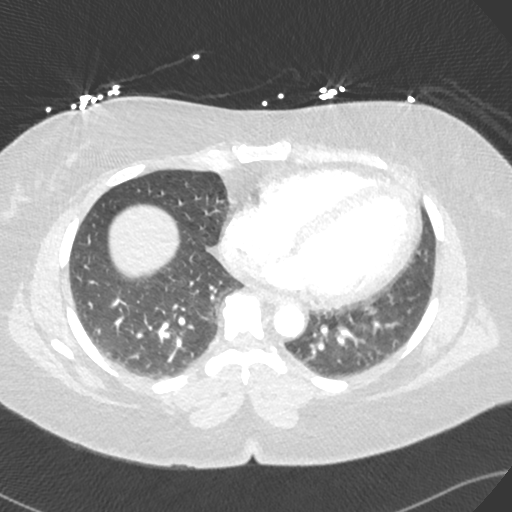
[im 142/366  soft-tissue]
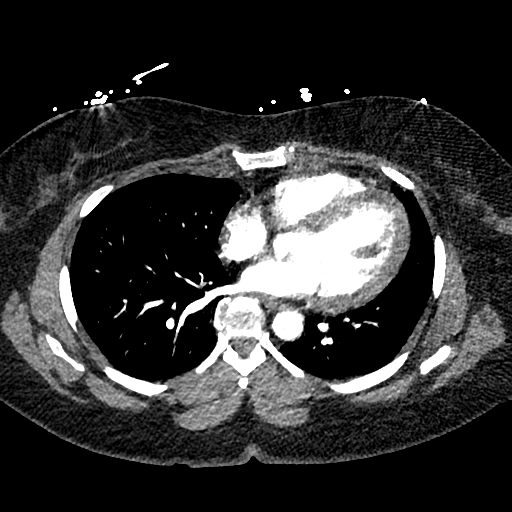
[im 163/366  lung]
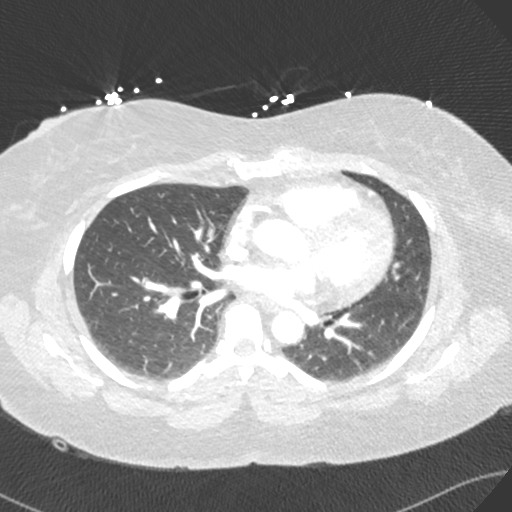
[im 183/366  soft-tissue]
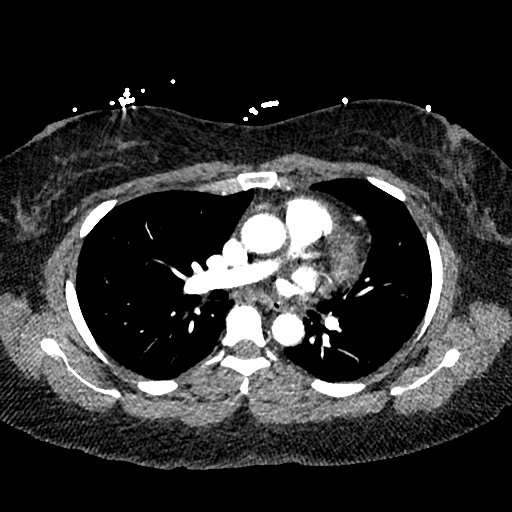
[im 203/366  lung]
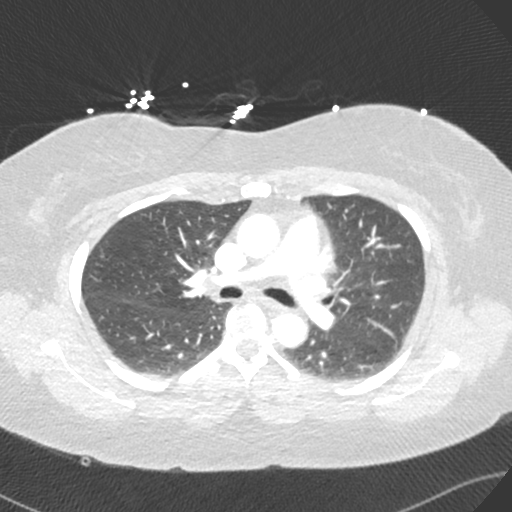
[im 224/366  soft-tissue]
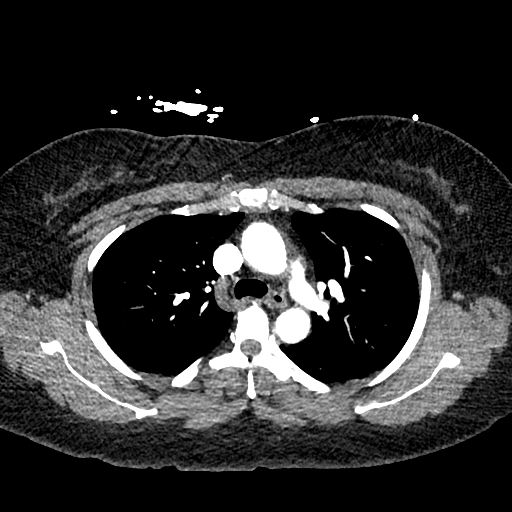
[im 244/366  lung]
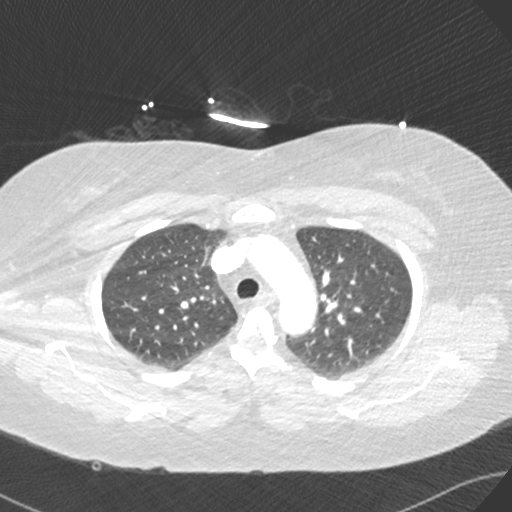
[im 284/366  soft-tissue]
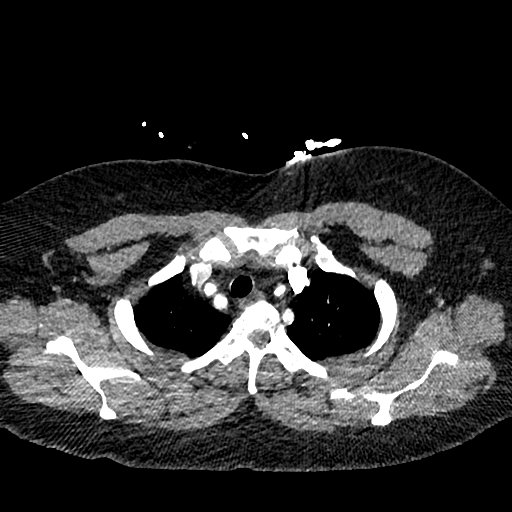
[im 305/366  lung]
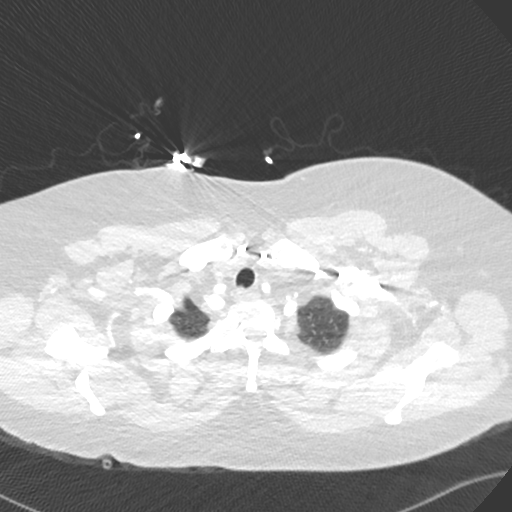
[im 325/366  soft-tissue]
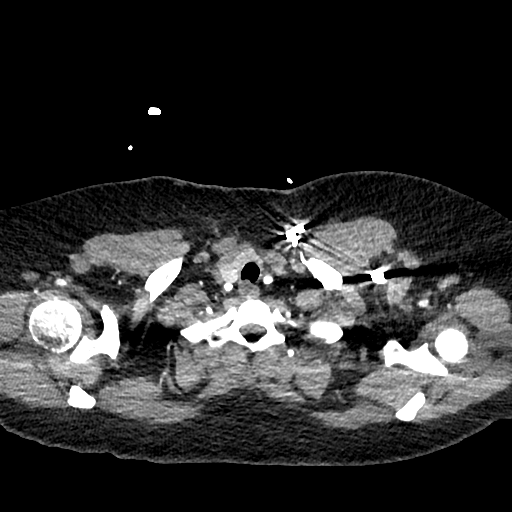
[im 345/366  lung]
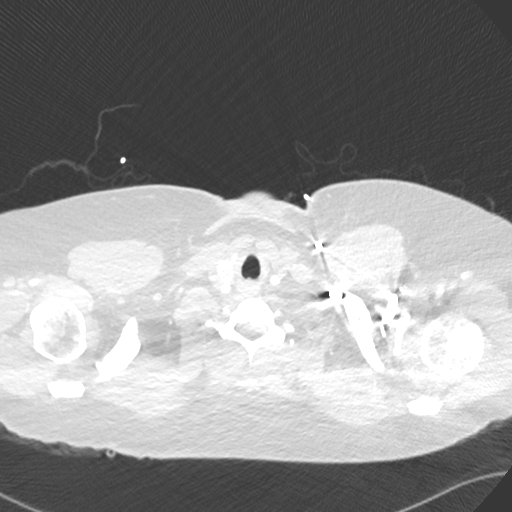

[Series 8: cor · coronal · 0.52mm/px · 3 of 151 slices shown]
[im 38/151  soft-tissue]
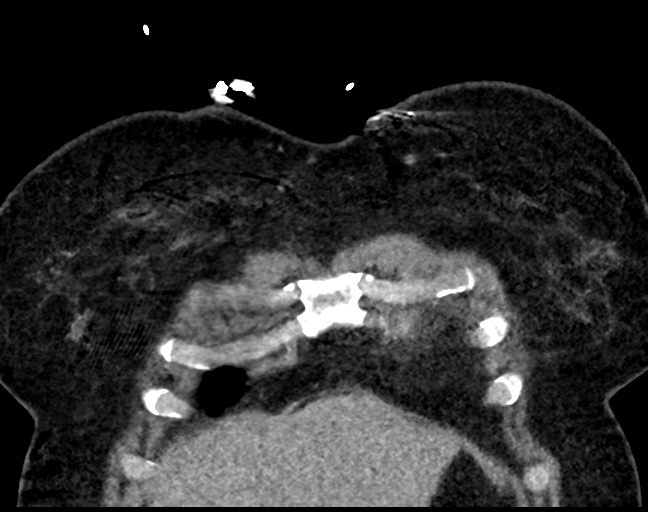
[im 76/151  soft-tissue]
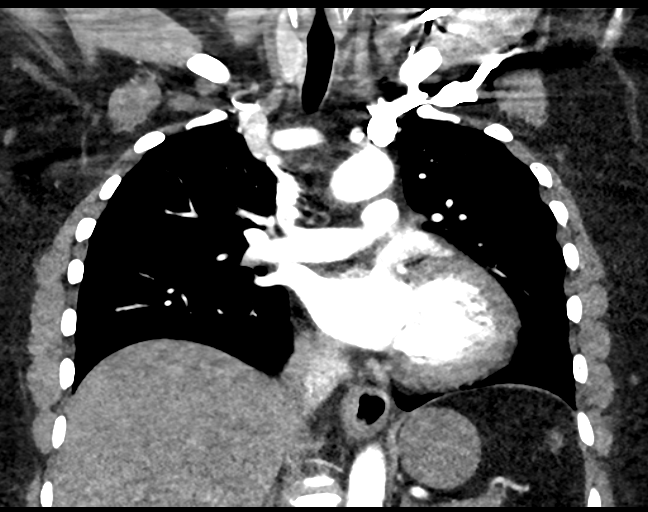
[im 113/151  soft-tissue]
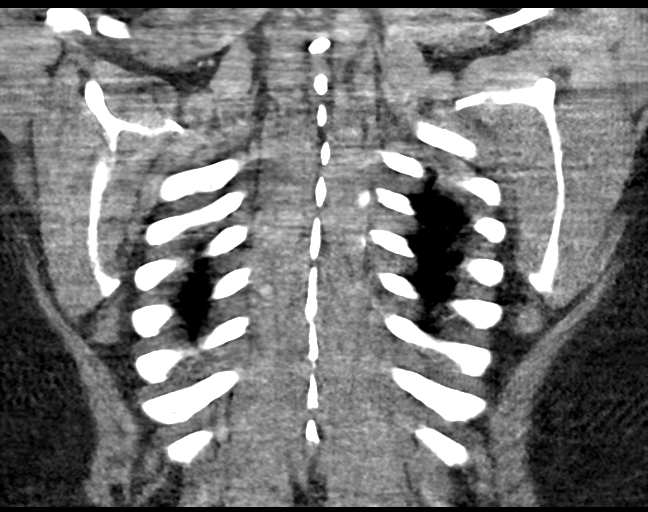

[18 of 46 positions shown; findings below may reference images not displayed]

FINDINGS: Cardiovascular: Normal heart size. No pericardial effusion. Normal
caliber thoracic aorta. Adequate opacification of the main pulmonary
artery. No evidence for pulmonary embolus.

Mediastinum/Nodes: No enlarged axillary, mediastinal or hilar
lymphadenopathy. Small hiatal hernia.

Lungs/Pleura: Central airways are patent. Dependent atelectasis
within the bilateral lower lobes. No pleural effusion.

Upper Abdomen: No acute process.

Musculoskeletal: Thoracic spine degenerative changes. No aggressive
or acute appearing osseous lesions.

Review of the MIP images confirms the above findings.
IMPRESSION: 1. No acute pulmonary embolus.
2. No acute process in the chest.

## 2019-02-12 ENCOUNTER — Ambulatory Visit (INDEPENDENT_AMBULATORY_CARE_PROVIDER_SITE_OTHER): Payer: Self-pay | Admitting: Family Medicine

## 2019-02-12 ENCOUNTER — Other Ambulatory Visit: Payer: Self-pay

## 2019-02-12 ENCOUNTER — Encounter: Payer: Self-pay | Admitting: Family Medicine

## 2019-02-12 VITALS — BP 144/92

## 2019-02-12 DIAGNOSIS — E781 Pure hyperglyceridemia: Secondary | ICD-10-CM

## 2019-02-12 DIAGNOSIS — E786 Lipoprotein deficiency: Secondary | ICD-10-CM

## 2019-02-12 DIAGNOSIS — R7303 Prediabetes: Secondary | ICD-10-CM

## 2019-02-12 DIAGNOSIS — Z09 Encounter for follow-up examination after completed treatment for conditions other than malignant neoplasm: Secondary | ICD-10-CM

## 2019-02-12 DIAGNOSIS — R21 Rash and other nonspecific skin eruption: Secondary | ICD-10-CM

## 2019-02-12 DIAGNOSIS — E119 Type 2 diabetes mellitus without complications: Secondary | ICD-10-CM

## 2019-02-12 DIAGNOSIS — L299 Pruritus, unspecified: Secondary | ICD-10-CM

## 2019-02-12 DIAGNOSIS — R0602 Shortness of breath: Secondary | ICD-10-CM

## 2019-02-12 DIAGNOSIS — I10 Essential (primary) hypertension: Secondary | ICD-10-CM

## 2019-02-12 DIAGNOSIS — R7309 Other abnormal glucose: Secondary | ICD-10-CM

## 2019-02-12 DIAGNOSIS — M545 Low back pain, unspecified: Secondary | ICD-10-CM

## 2019-02-12 DIAGNOSIS — R599 Enlarged lymph nodes, unspecified: Secondary | ICD-10-CM

## 2019-02-12 DIAGNOSIS — R8290 Unspecified abnormal findings in urine: Secondary | ICD-10-CM

## 2019-02-12 DIAGNOSIS — R600 Localized edema: Secondary | ICD-10-CM

## 2019-02-12 DIAGNOSIS — R609 Edema, unspecified: Secondary | ICD-10-CM

## 2019-02-12 LAB — POCT URINALYSIS DIP (MANUAL ENTRY)
Bilirubin, UA: NEGATIVE
Glucose, UA: NEGATIVE mg/dL
Ketones, POC UA: NEGATIVE mg/dL
Leukocytes, UA: NEGATIVE
Nitrite, UA: NEGATIVE
Protein Ur, POC: 100 mg/dL — AB
Spec Grav, UA: 1.025 (ref 1.010–1.025)
Urobilinogen, UA: 0.2 E.U./dL
pH, UA: 6.5 (ref 5.0–8.0)

## 2019-02-12 LAB — POCT GLYCOSYLATED HEMOGLOBIN (HGB A1C): Hemoglobin A1C: 7.7 % — AB (ref 4.0–5.6)

## 2019-02-12 MED ORDER — VITAMIN D3 125 MCG (5000 UT) PO CAPS: 5000.0000 [IU] | ORAL_CAPSULE | Freq: Every day | ORAL | 1 refills | Status: AC

## 2019-02-12 MED ORDER — MELOXICAM 15 MG PO TABS: ORAL_TABLET | ORAL | 1 refills | Status: DC

## 2019-02-12 MED ORDER — GLIMEPIRIDE 2 MG PO TABS: 2.0000 mg | ORAL_TABLET | Freq: Every day | ORAL | 1 refills | Status: DC

## 2019-02-12 MED ORDER — HYDROXYZINE HCL 10 MG PO TABS: 10.0000 mg | ORAL_TABLET | Freq: Three times a day (TID) | ORAL | 3 refills | Status: DC | PRN

## 2019-02-12 MED ORDER — HYDROCORTISONE 1 % EX CREA: 1.0000 "application " | TOPICAL_CREAM | Freq: Two times a day (BID) | CUTANEOUS | 3 refills | Status: DC

## 2019-02-12 MED ORDER — FUROSEMIDE 40 MG PO TABS: 40.0000 mg | ORAL_TABLET | Freq: Two times a day (BID) | ORAL | 1 refills | Status: DC

## 2019-02-12 MED ORDER — NIFEDIPINE ER 60 MG PO TB24: 60.0000 mg | ORAL_TABLET | Freq: Every day | ORAL | 1 refills | Status: DC

## 2019-02-12 MED ORDER — MONTELUKAST SODIUM 10 MG PO TABS: 10.0000 mg | ORAL_TABLET | Freq: Every day | ORAL | 1 refills | Status: DC

## 2019-02-12 MED ORDER — FLUTICASONE PROPIONATE 50 MCG/ACT NA SUSP: 2.0000 | Freq: Every day | NASAL | 6 refills | Status: DC | PRN

## 2019-02-12 MED ORDER — ALLOPURINOL 300 MG PO TABS: 300.0000 mg | ORAL_TABLET | Freq: Every day | ORAL | 1 refills | Status: DC

## 2019-02-12 MED ORDER — FAMOTIDINE 20 MG PO TABS: 20.0000 mg | ORAL_TABLET | Freq: Two times a day (BID) | ORAL | 1 refills | Status: DC

## 2019-02-12 MED ORDER — ALBUTEROL SULFATE HFA 108 (90 BASE) MCG/ACT IN AERS: 1.0000 | INHALATION_SPRAY | Freq: Four times a day (QID) | RESPIRATORY_TRACT | 1 refills | Status: DC | PRN

## 2019-02-12 MED ORDER — POTASSIUM CHLORIDE CRYS ER 20 MEQ PO TBCR: 20.0000 meq | EXTENDED_RELEASE_TABLET | Freq: Every day | ORAL | 1 refills | Status: DC

## 2019-02-12 MED ORDER — ROSUVASTATIN CALCIUM 10 MG PO TABS: 10.0000 mg | ORAL_TABLET | Freq: Every day | ORAL | 1 refills | Status: DC

## 2019-02-12 NOTE — Progress Notes (Signed)
Patient [facility]  Established Patient Office Visit  Subjective:  Patient ID: Nancy Pham, female    DOB: [birth year]  Age: 53 y.o. MRN: [id number]  CC:  Chief Complaint  Patient presents with  . Follow-up    chronic condition   . Rash    on both arms    HPI Nancy Pham is a 53 year old female who presents for follow up today.   Past Medical History:  Diagnosis Date  . Asthma   . Cancer ([place])   . Diabetes mellitus   . Gout   . Hyperlipidemia   . Hypertension    Current Status: Since her last office visit, she is doing well with no complaints. She denies visual changes, chest pain, cough, shortness of breath, heart palpitations, and falls. She has occasional headaches and dizziness with position changes. Denies severe headaches, confusion, seizures, double vision, and blurred vision, nausea and vomiting. She denies fatigue, frequent urination, blurred vision, excessive hunger, excessive thirst, weight gain, weight loss, and poor wound healing. She continues to check her feet regularly.   She denies fevers, chills, recent infections, weight loss, and night sweats. She has not had any falls. No chest pain, heart palpitations, cough and shortness of breath reported. No reports of GI problems such as  diarrhea, and constipation. She has no reports of blood in stools, dysuria and hematuria. No depression or anxiety reported. She denies pain today.   Past Surgical History:  Procedure Laterality Date  . ABDOMINAL HYSTERECTOMY      Family History  Problem Relation Age of Onset  . Seizures Mother   . Heart attack Father   . Heart attack Sister   . Hypertension Sister   . Hypertension Brother   . Hypertension Brother   . Hypertension Brother   . Hypertension Brother   . Hypertension Sister     Social History   Socioeconomic History  . Marital status: Single    Spouse name: Not on file  . Number of children: Not on file  .  Years of education: Not on file  . Highest education level: Not on file  Occupational History  . Not on file  Social Needs  . Financial resource strain: Not on file  . Food insecurity    Worry: Not on file    Inability: Not on file  . Transportation needs    Medical: Not on file    Non-medical: Not on file  Tobacco Use  . Smoking status: Never Smoker  . Smokeless tobacco: Never Used  Substance and Sexual Activity  . Alcohol use: No  . Drug use: No  . Sexual activity: Not on file  Lifestyle  . Physical activity    Days per week: Not on file    Minutes per session: Not on file  . Stress: Not on file  Relationships  . Social Herbalist on phone: Not on file    Gets together: Not on file    Attends religious service: Not on file    Active member of club or organization: Not on file    Attends meetings of clubs or organizations: Not on file    Relationship status: Not on file  . Intimate partner violence    Fear of current or ex partner: Not on file    Emotionally abused: Not on file    Physically abused: Not on file    Forced sexual activity: Not on file  Other Topics Concern  . Not on file  Social History Narrative  . Not on file    Outpatient Medications Prior to Visit  Medication Sig Dispense Refill  . acetaminophen (TYLENOL) 325 MG tablet Take 325-650 mg by mouth every 6 (six) hours as needed for mild pain, fever or headache.     Marland Kitchen aspirin EC 81 MG tablet TAKE ONE TABLET BY MOUTH EVERY DAY 60 tablet 0  . cyclobenzaprine (FLEXERIL) 10 MG tablet TAKE ONE TABLET BY MOUTH THREE TIMES DAILY AS NEEDED FOR MUSCLE SPASMS. 90 tablet 0  . EPINEPHrine 0.3 mg/0.3 mL IJ SOAJ injection Inject 0.3 mLs (0.3 mg total) into the muscle as needed (acute resp failure/worsening anaphylaxis). 3 Device 1  . ferrous sulfate 325 (65 FE) MG tablet Take 325 mg by mouth daily with breakfast.    . Liraglutide (VICTOZA) 18 MG/3ML SOLN injection Inject 1.8 mLs (10.8 mg total) into the  skin daily. 50 mL 0  . Multiple Vitamins-Minerals (CENTRUM SILVER 50+WOMEN) TABS Take 1 tablet by mouth daily.    . Probiotic Product (PROBIOTIC DAILY PO) Take 1 capsule by mouth daily.    Marland Kitchen albuterol (PROVENTIL HFA;VENTOLIN HFA) 108 (90 Base) MCG/ACT inhaler Inhale 1-2 puffs into the lungs every 6 (six) hours as needed for wheezing or shortness of breath. 8 g 2  . allopurinol (ZYLOPRIM) 300 MG tablet Take 1 tablet (300 mg total) by mouth daily. 90 tablet 1  . Cholecalciferol (VITAMIN D3) 5000 units CAPS Take 5,000 Units by mouth daily.    . famotidine (PEPCID) 20 MG tablet Take 1 tablet (20 mg total) by mouth 2 (two) times daily. (Patient taking differently: Take 20 mg by mouth 2 (two) times daily. Patient is taking once daily not BID) 14 tablet 0  . fluticasone (FLONASE) 50 MCG/ACT nasal spray Place 2 sprays into both nostrils daily as needed for allergies or rhinitis.    . furosemide (LASIX) 40 MG tablet Take 1 tablet (40 mg total) by mouth 2 (two) times daily. 60 tablet 3  . glimepiride (AMARYL) 2 MG tablet Take 1 tablet (2 mg total) by mouth daily with breakfast. 30 tablet 2  . meloxicam (MOBIC) 15 MG tablet TAKE ONE TABLET BY MOUTH EVERY DAY AS NEEDED FOR PAIN 30 tablet 0  . montelukast (SINGULAIR) 10 MG tablet Take 1 tablet (10 mg total) by mouth at bedtime. 30 tablet 3  . NIFEdipine (PROCARDIA-XL/ADALAT CC) 60 MG 24 hr tablet Take 1 tablet (60 mg total) by mouth daily. 90 tablet 1  . potassium chloride SA (K-DUR,KLOR-CON) 20 MEQ tablet Take 1 tablet (20 mEq total) by mouth daily. 30 tablet 1  . rosuvastatin (CRESTOR) 10 MG tablet TAKE ONE TABLET BY MOUTH EVERY DAY 90 tablet 0   No facility-administered medications prior to visit.     Allergies  Allergen Reactions  . Ace Inhibitors Swelling    Swelling of the lips  . Tape Rash    No Band-Aids!!    ROS Review of Systems  Constitutional: Negative.   HENT: Negative.   Eyes: Negative.   Respiratory: Negative.   Cardiovascular:  Negative.   Gastrointestinal: Negative.   Endocrine: Negative.   Genitourinary: Negative.   Musculoskeletal: Negative.   Skin: Negative.   Allergic/Immunologic: Negative.   Neurological: Positive for dizziness (occasional) and headaches (occasional ).  Hematological: Negative.   Psychiatric/Behavioral: Negative.    Objective:    Physical Exam  Constitutional: She is oriented to person, place, and time. She appears well-developed and  well-nourished.  HENT:  Head: Normocephalic and atraumatic.  Eyes: Conjunctivae are normal.  Neck: Normal range of motion. Neck supple.  Cardiovascular: Normal rate, regular rhythm, normal heart sounds and intact distal pulses.  Pulmonary/Chest: Effort normal and breath sounds normal.  Abdominal: Soft. Bowel sounds are normal. She exhibits distension (obese).  Musculoskeletal: Normal range of motion.  Neurological: She is alert and oriented to person, place, and time. She has normal reflexes.  Skin: Skin is warm and dry.  Psychiatric: She has a normal mood and affect. Her behavior is normal. Judgment and thought content normal.  Nursing note and vitals reviewed.   BP (!) 144/92  Wt Readings from Last 3 Encounters:  07/25/18 294 lb (133.4 kg)  06/27/18 295 lb (133.8 kg)  01/02/18 288 lb (130.6 kg)     Health Maintenance Due  Topic Date Due  . OPHTHALMOLOGY EXAM  02/11/1975  . PAP SMEAR-Modifier  02/10/1986  . COLONOSCOPY  02/11/2015  . MAMMOGRAM  10/23/2017  . FOOT EXAM  12/03/2018  . URINE MICROALBUMIN  12/03/2018    There are no preventive care reminders to display for this patient.  No results found for: TSH Lab Results  Component Value Date   WBC 10.1 08/21/2017   HGB 12.9 08/21/2017   HCT 38.5 08/21/2017   MCV 87.7 08/21/2017   PLT 298 08/21/2017   Lab Results  Component Value Date   NA 141 12/02/2017   K 4.0 12/02/2017   CO2 26 12/02/2017   GLUCOSE 99 12/02/2017   BUN 9 12/02/2017   CREATININE 0.69 12/02/2017    BILITOT 0.4 09/26/2011   ALKPHOS 83 09/26/2011   AST 21 09/26/2011   ALT 15 09/26/2011   PROT 8.0 09/26/2011   ALBUMIN 3.9 09/26/2011   CALCIUM 9.6 12/02/2017   ANIONGAP 14 08/21/2017   Lab Results  Component Value Date   CHOL 167 08/22/2017   Lab Results  Component Value Date   HDL 58 08/22/2017   Lab Results  Component Value Date   LDLCALC 39 08/22/2017   Lab Results  Component Value Date   TRIG 350 (H) 08/22/2017   Lab Results  Component Value Date   CHOLHDL 2.9 08/22/2017   Lab Results  Component Value Date   HGBA1C 7.7 (A) 02/12/2019      Assessment & Plan:   1. Type 2 diabetes mellitus without complication, without long-term current use of insulin (HCC) - POCT urinalysis dipstick - POCT glycosylated hemoglobin (Hb A1C) - Microalbumin, urine  2. Diabetes mellitus without complication (HCC) - glimepiride (AMARYL) 2 MG tablet; Take 1 tablet (2 mg total) by mouth daily with breakfast.  Dispense: 90 tablet; Refill: 1  3. Hemoglobin A1C between 7% and 9% indicating borderline diabetic control The current medical regimen is effective; Hgb A1c is decreased at 7.7, form 9.2 on 07/25/2018; continue present plan and medications as prescribed.  She will continue to decrease foods/beverages high in sugars and carbs and follow Heart Healthy or DASH diet. Increase physical activity to at least 30 minutes cardio exercise daily.   4. Essential hypertension The current medical regimen is effective; blood pressure  is stable at 144/92 today; continue present plan and medications as prescribed. She will continue to decrease high sodium intake, excessive alcohol intake, increase potassium intake, smoking cessation, and increase physical activity of at least 30 minutes of cardio activity daily. She will continue to follow Heart Healthy or DASH diet. - Microalbumin, urine - furosemide (LASIX) 40 MG tablet; Take 1 tablet (  40 mg total) by mouth 2 (two) times daily.  Dispense: 180  tablet; Refill: 1 - NIFEdipine (ADALAT CC) 60 MG 24 hr tablet; Take 1 tablet (60 mg total) by mouth daily.  Dispense: 90 tablet; Refill: 1  5. 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  Dispense: 90 tablet; Refill: 1  6. Abnormal urinalysis Results are pending.  - Urine Culture  7. Rash - hydrocortisone cream (HYDROCORTISONE ANTI-ITCH) 1 %; Apply 1 application topically 2 (two) times daily.  Dispense: 30 g; Refill: 3 - hydrOXYzine (ATARAX/VISTARIL) 10 MG tablet; Take 1 tablet (10 mg total) by mouth 3 (three) times daily as needed.  Dispense: 30 tablet; Refill: 3  8. Itching - hydrocortisone cream (HYDROCORTISONE ANTI-ITCH) 1 %; Apply 1 application topically 2 (two) times daily.  Dispense: 30 g; Refill: 3 - hydrOXYzine (ATARAX/VISTARIL) 10 MG tablet; Take 1 tablet (10 mg total) by mouth 3 (three) times daily as needed.  Dispense: 30 tablet; Refill: 3  9. Enlargement of lymph node - US Soft Tissue Head/Neck; Future  10. Edema, peripheral - furosemide (LASIX) 40 MG tablet; Take 1 tablet (40 mg total) by mouth 2 (two) times daily.  Dispense: 180 tablet; Refill: 1  11. Bilateral low back pain without sciatica, unspecified chronicity - meloxicam (MOBIC) 15 MG tablet; TAKE ONE TABLET BY MOUTH EVERY DAY AS NEEDED FOR PAIN  Dispense: 90 tablet; Refill: 1  12. Shortness of breath - montelukast (SINGULAIR) 10 MG tablet; Take 1 tablet (10 mg total) by mouth at bedtime.  Dispense: 90 tablet; Refill: 1  13. Follow up She will follow up in 6 months.   Meds ordered this encounter  Medications  . hydrocortisone cream (HYDROCORTISONE ANTI-ITCH) 1 %    Sig: Apply 1 application topically 2 (two) times daily.    Dispense:  30 g    Refill:  3  . hydrOXYzine (ATARAX/VISTARIL) 10 MG tablet    Sig: Take 1 tablet (10 mg total) by mouth 3 (three) times daily as needed.     Dispense:  30 tablet    Refill:  3  . albuterol (VENTOLIN HFA) 108 (90 Base) MCG/ACT inhaler    Sig: Inhale 1-2 puffs into the lungs every 6 (six) hours as needed for wheezing or shortness of breath. Please dispense 3 months supple.    Dispense:  8 g    Refill:  1  . allopurinol (ZYLOPRIM) 300 MG tablet    Sig: Take 1 tablet (300 mg total) by mouth daily.    Dispense:  90 tablet    Refill:  1  . Cholecalciferol (VITAMIN D3) 125 MCG (5000 UT) CAPS    Sig: Take 1 capsule (5,000 Units total) by mouth daily.    Dispense:  15 capsule    Refill:  1  . DISCONTD: famotidine (PEPCID) 20 MG tablet    Sig: Take 1 tablet (20 mg total) by mouth 2 (two) times daily.    Dispense:  180 tablet    Refill:  1  . fluticasone (FLONASE) 50 MCG/ACT nasal spray    Sig: Place 2 sprays into both nostrils daily as needed for allergies or rhinitis.    Dispense:  11.1 mL    Refill:  6    Please dsipense 3 months supply.  . furosemide (LASIX) 40 MG tablet    Sig: Take 1 tablet (40 mg total) by mouth 2 (two) times daily.  Dispense:  180 tablet    Refill:  1  . glimepiride (AMARYL) 2 MG tablet    Sig: Take 1 tablet (2 mg total) by mouth daily with breakfast.    Dispense:  90 tablet    Refill:  1  . meloxicam (MOBIC) 15 MG tablet    Sig: TAKE ONE TABLET BY MOUTH EVERY DAY AS NEEDED FOR PAIN    Dispense:  90 tablet    Refill:  1  . montelukast (SINGULAIR) 10 MG tablet    Sig: Take 1 tablet (10 mg total) by mouth at bedtime.    Dispense:  90 tablet    Refill:  1  . NIFEdipine (ADALAT CC) 60 MG 24 hr tablet    Sig: Take 1 tablet (60 mg total) by mouth daily.    Dispense:  90 tablet    Refill:  1    Dose change  . potassium chloride SA (K-DUR) 20 MEQ tablet    Sig: Take 1 tablet (20 mEq total) by mouth daily.    Dispense:  90 tablet    Refill:  1  . rosuvastatin (CRESTOR) 10 MG tablet    Sig: Take 1 tablet (10 mg total) by mouth daily.    Dispense:  90 tablet    Refill:  1  . famotidine (PEPCID) 20  MG tablet    Sig: Take 1 tablet (20 mg total) by mouth 2 (two) times daily.    Dispense:  180 tablet    Refill:  1    Orders Placed This Encounter  Procedures  . Urine Culture  . US Soft Tissue Head/Neck  . Microalbumin, urine  . POCT urinalysis dipstick  . POCT glycosylated hemoglobin (Hb A1C)    Referral Orders  No referral(s) requested today   Referral Orders  No referral(s) requested today    Kathe Becton,  MSN, FNP-BC [address], [place] [address] [phone]   Problem List Items Addressed This Visit      Cardiovascular and Mediastinum   Hypertension   Relevant Medications   furosemide (LASIX) 40 MG tablet   NIFEdipine (ADALAT CC) 60 MG 24 hr tablet   rosuvastatin (CRESTOR) 10 MG tablet   Other Relevant Orders   Microalbumin, urine     Endocrine   Diabetes mellitus without complication (HCC)   Relevant Medications   glimepiride (AMARYL) 2 MG tablet   rosuvastatin (CRESTOR) 10 MG tablet    Other Visit Diagnoses    Type 2 diabetes mellitus without complication, without long-term current use of insulin (HCC)    -  Primary   Relevant Medications   glimepiride (AMARYL) 2 MG tablet   rosuvastatin (CRESTOR) 10 MG tablet   Other Relevant Orders   POCT urinalysis dipstick (Completed)   POCT glycosylated hemoglobin (Hb A1C) (Completed)   Microalbumin, urine   Hemoglobin A1C between 7% and 9% indicating borderline diabetic control       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       Relevant Medications   furosemide (LASIX) 40 MG tablet   NIFEdipine (ADALAT CC) 60 MG 24 hr tablet   rosuvastatin (CRESTOR) 10 MG tablet   Abnormal urinalysis       Relevant Orders   Urine Culture   Rash       Relevant Medications   hydrocortisone cream (HYDROCORTISONE ANTI-ITCH) 1 %   hydrOXYzine (ATARAX/VISTARIL) 10 MG  tablet   Itching       Relevant Medications   hydrocortisone cream (HYDROCORTISONE ANTI-ITCH) 1 %   hydrOXYzine (ATARAX/VISTARIL) 10 MG tablet   Enlargement of lymph node       Relevant Orders   US Soft Tissue Head/Neck   Edema, peripheral       Relevant Medications   furosemide (LASIX) 40 MG tablet   Bilateral low back pain without sciatica, unspecified chronicity       Relevant Medications   meloxicam (MOBIC) 15 MG tablet   Shortness of breath       Relevant Medications   montelukast (SINGULAIR) 10 MG tablet   Follow up          Meds ordered this encounter  Medications  . hydrocortisone cream (HYDROCORTISONE ANTI-ITCH) 1 %    Sig: Apply 1 application topically 2 (two) times daily.    Dispense:  30 g    Refill:  3  . hydrOXYzine (ATARAX/VISTARIL) 10 MG tablet    Sig: Take 1 tablet (10 mg total) by mouth 3 (three) times daily as needed.    Dispense:  30 tablet    Refill:  3  . albuterol (VENTOLIN HFA) 108 (90 Base) MCG/ACT inhaler    Sig: Inhale 1-2 puffs into the lungs every 6 (six) hours as needed for wheezing or shortness of breath. Please dispense 3 months supple.    Dispense:  8 g    Refill:  1  . allopurinol (ZYLOPRIM) 300 MG tablet    Sig: Take 1 tablet (300 mg total) by mouth daily.    Dispense:  90 tablet    Refill:  1  . Cholecalciferol (VITAMIN D3) 125 MCG (5000 UT) CAPS    Sig: Take 1 capsule (5,000 Units total) by mouth daily.    Dispense:  15 capsule    Refill:  1  . DISCONTD: famotidine (PEPCID) 20 MG tablet    Sig: Take 1 tablet (20 mg total) by mouth 2 (two) times daily.    Dispense:  180 tablet    Refill:  1  . fluticasone (FLONASE) 50 MCG/ACT nasal spray    Sig: Place 2 sprays into both nostrils daily as needed for allergies or rhinitis.    Dispense:  11.1 mL    Refill:  6    Please dsipense 3 months supply.  . furosemide (LASIX) 40 MG tablet    Sig: Take 1 tablet (40 mg total) by mouth 2 (two) times daily.    Dispense:  180 tablet    Refill:   1  . glimepiride (AMARYL) 2 MG tablet    Sig: Take 1 tablet (2 mg total) by mouth daily with breakfast.    Dispense:  90 tablet    Refill:  1  . meloxicam (MOBIC) 15 MG tablet    Sig: TAKE ONE TABLET BY MOUTH EVERY DAY AS NEEDED FOR PAIN    Dispense:  90 tablet    Refill:  1  . montelukast (SINGULAIR) 10 MG tablet    Sig: Take 1 tablet (10 mg total) by mouth at bedtime.    Dispense:  90 tablet    Refill:  1  . NIFEdipine (ADALAT CC) 60 MG 24 hr tablet    Sig: Take 1 tablet (60 mg total) by mouth daily.    Dispense:  90 tablet    Refill:  1    Dose change  . potassium chloride SA (K-DUR) 20 MEQ tablet    Sig: Take 1  tablet (20 mEq total) by mouth daily.    Dispense:  90 tablet    Refill:  1  . rosuvastatin (CRESTOR) 10 MG tablet    Sig: Take 1 tablet (10 mg total) by mouth daily.    Dispense:  90 tablet    Refill:  1  . famotidine (PEPCID) 20 MG tablet    Sig: Take 1 tablet (20 mg total) by mouth 2 (two) times daily.    Dispense:  180 tablet    Refill:  1    Follow-up: Return in about 6 months (around 08/15/2019).    Azzie Glatter, FNP

## 2019-02-12 NOTE — Patient Instructions (Addendum)
Hydroxyzine capsules or tablets What is this medicine? HYDROXYZINE (hye Chelsea i zeen) is an antihistamine. This medicine is used to treat allergy symptoms. It is also used to treat anxiety and tension. This medicine can be used with other medicines to induce sleep before surgery. This medicine may be used for other purposes; ask your health care provider or pharmacist if you have questions. COMMON BRAND NAME(S): ANX, Atarax, Rezine, Vistaril What should I tell my health care provider before I take this medicine? They need to know if you have any of these conditions:  glaucoma  heart disease  history of irregular heartbeat  kidney disease  liver disease  lung or breathing disease, like asthma  stomach or intestine problems  thyroid disease  trouble passing urine  an unusual or allergic reaction to hydroxyzine, cetirizine, other medicines, foods, dyes or preservatives  pregnant or trying to get pregnant  breast-feeding How should I use this medicine? Take this medicine by mouth with a full glass of water. Follow the directions on the prescription label. You may take this medicine with food or on an empty stomach. Take your medicine at regular intervals. Do not take your medicine more often than directed. Talk to your pediatrician regarding the use of this medicine in children. Special care may be needed. While this drug may be prescribed for children as young as 38 years of age for selected conditions, precautions do apply. Patients over 15 years old may have a stronger reaction and need a smaller dose. Overdosage: If you think you have taken too much of this medicine contact a poison control center or emergency room at once. NOTE: This medicine is only for you. Do not share this medicine with others. What if I miss a dose? If you miss a dose, take it as soon as you can. If it is almost time for your next dose, take only that dose. Do not take double or extra doses. What may  interact with this medicine? Do not take this medicine with any of the following medications:  cisapride  dronedarone  pimozide  thioridazine This medicine may also interact with the following medications:  alcohol  antihistamines for allergy, cough, and cold  atropine  barbiturate medicines for sleep or seizures, like phenobarbital  certain antibiotics like erythromycin or clarithromycin  certain medicines for anxiety or sleep  certain medicines for bladder problems like oxybutynin, tolterodine  certain medicines for depression or psychotic disturbances  certain medicines for irregular heart beat  certain medicines for Parkinson's disease like benztropine, trihexyphenidyl  certain medicines for seizures like phenobarbital, primidone  certain medicines for stomach problems like dicyclomine, hyoscyamine  certain medicines for travel sickness like scopolamine  ipratropium  narcotic medicines for pain  other medicines that prolong the QT interval (an abnormal heart rhythm) like dofetilide This list may not describe all possible interactions. Give your health care provider a list of all the medicines, herbs, non-prescription drugs, or dietary supplements you use. Also tell them if you smoke, drink alcohol, or use illegal drugs. Some items may interact with your medicine. What should I watch for while using this medicine? Tell your doctor or health care professional if your symptoms do not improve. You may get drowsy or dizzy. Do not drive, use machinery, or do anything that needs mental alertness until you know how this medicine affects you. Do not stand or sit up quickly, especially if you are an older patient. This reduces the risk of dizzy or fainting spells. Alcohol may  interfere with the effect of this medicine. Avoid alcoholic drinks. Your mouth may get dry. Chewing sugarless gum or sucking hard candy, and drinking plenty of water may help. Contact your doctor if the  problem does not go away or is severe. This medicine may cause dry eyes and blurred vision. If you wear contact lenses you may feel some discomfort. Lubricating drops may help. See your eye doctor if the problem does not go away or is severe. If you are receiving skin tests for allergies, tell your doctor you are using this medicine. What side effects may I notice from receiving this medicine? Side effects that you should report to your doctor or health care professional as soon as possible:  allergic reactions like skin rash, itching or hives, swelling of the face, lips, or tongue  changes in vision  confusion  fast, irregular heartbeat  seizures  tremor  trouble passing urine or change in the amount of urine Side effects that usually do not require medical attention (report to your doctor or health care professional if they continue or are bothersome):  constipation  drowsiness  dry mouth  headache  tiredness This list may not describe all possible side effects. Call your doctor for medical advice about side effects. You may report side effects to FDA at 1-800-FDA-1088. Where should I keep my medicine? Keep out of the reach of children. Store at room temperature between 15 and 30 degrees C (59 and 86 degrees F). Keep container tightly closed. Throw away any unused medicine after the expiration date. NOTE: This sheet is a summary. It may not cover all possible information. If you have questions about this medicine, talk to your doctor, pharmacist, or health care provider.  2020 Elsevier/Gold Standard (2018-07-03 13:19:55) Hydrocortisone skin cream, ointment, lotion, or solution What is this medicine? HYDROCORTISONE (hye droe KOR ti sone) is a corticosteroid. It is used on the skin to reduce swelling, redness, itching, and allergic reactions. This medicine may be used for other purposes; ask your health care provider or pharmacist if you have questions. COMMON BRAND NAME(S):  Ala-Cort, Ala-Scalp, Anusol HC, Aqua Glycolic HC, Balneol for Her, Caldecort, Cetacort, Cortaid, Cortaid Advanced, Cortaid Intensive Therapy, Cortaid Sensitive Skin, CortAlo, Corticaine, Corticool, Cortizone, Cortizone-10, Cortizone-10 Cooling Relief, Cortizone-10 Intensive Healing, Cortizone-10 Plus, Dermarest Dricort, Dermarest Eczema, DERMASORB HC Complete, Gly-Cort, Hycort, Hydro Skin, Hydrocortisone in Absorbase, Hydroskin, Hytone, Instacort, Lacticare HC, Locoid, Locoid Lipocream, MiCort-HC, Monistat Complete Care Instant Itch Relief Cream, Neosporin Eczema, NuCort, Nutracort, NuZon, Pandel, Pediaderm HC, Penecort, Preparation H Hydrocortisone, Procto-Kit, Procto-Med HC, Procto-Pak, Proctocort, Proctocream-HC, Proctosol-HC, Proctozone-HC, Rederm, Sarnol-HC, Nurse, adult, Engineer, site, [place], Tucks HC, Vagisil Anti-Itch, [facility] Intensive Healing, Human resources officer What should I tell my health care provider before I take this medicine? They need to know if you have any of these conditions:  any active infection  large areas of burned or damaged skin  skin wasting or thinning  an unusual or allergic reaction to hydrocortisone, corticosteroids, sulfites, other medicines, foods, dyes, or preservatives  pregnant or trying to get pregnant  breast-feeding How should I use this medicine? This medicine is for external use only. Do not take by mouth. Follow the directions on the prescription label. Wash your hands before and after use. Apply a thin film of medicine to the affected area. Do not cover with a bandage or dressing unless your doctor or health care professional tells you to. Do not use on healthy skin or over large areas of skin. Do not get this medicine in  your eyes. If you do, rinse out with plenty of cool tap water. Do not to use more medicine than prescribed. Do not use your medicine more often than directed or for more than 14 days. Talk to your pediatrician regarding the use of this  medicine in children. Special care may be needed. While this drug may be prescribed for children as young as 64 years of age for selected conditions, precautions do apply. Do not use this medicine for the treatment of diaper rash unless directed to do so by your doctor or health care professional. If applying this medicine to the diaper area of a child, do not cover with tight-fitting diapers or plastic pants. This may increase the amount of medicine that passes through the skin and increase the risk of serious side effects. Elderly patients are more likely to have damaged skin through aging, and this may increase side effects. This medicine should only be used for brief periods and infrequently in older patients. Overdosage: If you think you have taken too much of this medicine contact a poison control center or emergency room at once. NOTE: This medicine is only for you. Do not share this medicine with others. What if I miss a dose? If you miss a dose, use it as soon as you can. If it is almost time for your next dose, use only that dose. Do not use double or extra doses. What may interact with this medicine? Interactions are not expected. Do not use any other skin products on the affected area without asking your doctor or health care professional. This list may not describe all possible interactions. Give your health care provider a list of all the medicines, herbs, non-prescription drugs, or dietary supplements you use. Also tell them if you smoke, drink alcohol, or use illegal drugs. Some items may interact with your medicine. What should I watch for while using this medicine? Tell your doctor or health care professional if your symptoms do not start to get better within 7 days or if they get worse. Tell your doctor or health care professional if you are exposed to anyone with measles or chickenpox, or if you develop sores or blisters that do not heal properly. What side effects may I notice from  receiving this medicine? Side effects that you should report to your doctor or health care professional as soon as possible:  allergic reactions like skin rash, itching or hives, swelling of the face, lips, or tongue  burning feeling on the skin  dark red spots on the skin  infection  lack of healing of skin condition  painful, red, pus filled blisters in hair follicles  thinning of the skin Side effects that usually do not require medical attention (report to your doctor or health care professional if they continue or are bothersome):  dry skin, irritation  unusual increased growth of hair on the face or body This list may not describe all possible side effects. Call your doctor for medical advice about side effects. You may report side effects to FDA at 1-800-FDA-1088. Where should I keep my medicine? Keep out of the reach of children. Store at room temperature between 15 and 30 degrees C (59 and 86 degrees F). Do not freeze. Throw away any unused medicine after the expiration date. NOTE: This sheet is a summary. It may not cover all possible information. If you have questions about this medicine, talk to your doctor, pharmacist, or health care provider.  2020 Elsevier/Gold Standard (2018-04-12 12:12:55)  Rash, Adult  A rash is a change in the color of your skin. A rash can also change the way your skin feels. There are many different conditions and factors that can cause a rash. Follow these instructions at home: The goal of treatment is to stop the itching and keep the rash from spreading. Watch for any changes in your symptoms. Let your doctor know about them. Follow these instructions to help with your condition: Medicine Take or apply over-the-counter and prescription medicines only as told by your doctor. These may include medicines:  To treat red or swollen skin (corticosteroid creams).  To treat itching.  To treat an allergy (oral antihistamines).  To treat very bad  symptoms (oral corticosteroids).  Skin care  Put cool cloths (compresses) on the affected areas.  Do not scratch or rub your skin.  Avoid covering the rash. Make sure that the rash is exposed to air as much as possible. Managing itching and discomfort  Avoid hot showers or baths. These can make itching worse. A cold shower may help.  Try taking a bath with: ? Epsom salts. You can get these at your local pharmacy or grocery store. Follow the instructions on the package. ? Baking soda. Pour a small amount into the bath as told by your doctor. ? Colloidal oatmeal. You can get this at your local pharmacy or grocery store. Follow the instructions on the package.  Try putting baking soda paste onto your skin. Stir water into baking soda until it gets like a paste.  Try putting on a lotion that relieves itchiness (calamine lotion).  Keep cool and out of the sun. Sweating and being hot can make itching worse. General instructions   Rest as needed.  Drink enough fluid to keep your pee (urine) pale yellow.  Wear loose-fitting clothing.  Avoid scented soaps, detergents, and perfumes. Use gentle soaps, detergents, perfumes, and other cosmetic products.  Avoid anything that causes your rash. Keep a journal to help track what causes your rash. Write down: ? What you eat. ? What cosmetic products you use. ? What you drink. ? What you wear. This includes jewelry.  Keep all follow-up visits as told by your doctor. This is important. Contact a doctor if:  You sweat at night.  You lose weight.  You pee (urinate) more than normal.  You pee less than normal, or you notice that your pee is a darker color than normal.  You feel weak.  You throw up (vomit).  Your skin or the whites of your eyes look yellow (jaundice).  Your skin: ? Tingles. ? Is numb.  Your rash: ? Does not go away after a few days. ? Gets worse.  You are: ? More thirsty than normal. ? More tired than  normal.  You have: ? New symptoms. ? Pain in your belly (abdomen). ? A fever. ? Watery poop (diarrhea). Get help right away if:  You have a fever and your symptoms suddenly get worse.  You start to feel mixed up (confused).  You have a very bad headache or a stiff neck.  You have very bad joint pains or stiffness.  You have jerky movements that you cannot control (seizure).  Your rash covers all or most of your body. The rash may or may not be painful.  You have blisters that: ? Are on top of the rash. ? Grow larger. ? Grow together. ? Are painful. ? Are inside your nose or mouth.  You have a  rash that: ? Looks like purple pinprick-sized spots all over your body. ? Has a "bull's eye" or looks like a target. ? Is red and painful, causes your skin to peel, and is not from being in the sun too long. Summary  A rash is a change in the color of your skin. A rash can also change the way your skin feels.  The goal of treatment is to stop the itching and keep the rash from spreading.  Take or apply over-the-counter and prescription medicines only as told by your doctor.  Contact a doctor if you have new symptoms or symptoms that get worse.  Keep all follow-up visits as told by your doctor. This is important. This information is not intended to replace advice given to you by your health care provider. Make sure you discuss any questions you have with your health care provider. Document Released: 12/29/2007 Document Revised: 11/03/2018 Document Reviewed: 02/13/2018 Elsevier Patient Education  [place]. Prednisone tablets What is this medicine? PREDNISONE (PRED ni sone) is a corticosteroid. It is commonly used to treat inflammation of the skin, joints, lungs, and other organs. Common conditions treated include asthma, allergies, and arthritis. It is also used for other conditions, such as blood disorders and diseases of the adrenal glands. This medicine may be used for  other purposes; ask your health care provider or pharmacist if you have questions. COMMON BRAND NAME(S): Deltasone, Predone, Sterapred, Sterapred DS What should I tell my health care provider before I take this medicine? They need to know if you have any of these conditions:  Cushing's syndrome  diabetes  glaucoma  heart disease  high blood pressure  infection (especially a virus infection such as chickenpox, cold sores, or herpes)  kidney disease  liver disease  mental illness  myasthenia gravis  osteoporosis  seizures  stomach or intestine problems  thyroid disease  an unusual or allergic reaction to lactose, prednisone, other medicines, foods, dyes, or preservatives  pregnant or trying to get pregnant  breast-feeding How should I use this medicine? Take this medicine by mouth with a glass of water. Follow the directions on the prescription label. Take this medicine with food. If you are taking this medicine once a day, take it in the morning. Do not take more medicine than you are told to take. Do not suddenly stop taking your medicine because you may develop a severe reaction. Your doctor will tell you how much medicine to take. If your doctor wants you to stop the medicine, the dose may be slowly lowered over time to avoid any side effects. Talk to your pediatrician regarding the use of this medicine in children. Special care may be needed. Overdosage: If you think you have taken too much of this medicine contact a poison control center or emergency room at once. NOTE: This medicine is only for you. Do not share this medicine with others. What if I miss a dose? If you miss a dose, take it as soon as you can. If it is almost time for your next dose, talk to your doctor or health care professional. You may need to miss a dose or take an extra dose. Do not take double or extra doses without advice. What may interact with this medicine? Do not take this medicine with  any of the following medications:  metyrapone  mifepristone This medicine may also interact with the following medications:  aminoglutethimide  amphotericin B  aspirin and aspirin-like medicines  barbiturates  certain  medicines for diabetes, like glipizide or glyburide  cholestyramine  cholinesterase inhibitors  cyclosporine  digoxin  diuretics  ephedrine  female hormones, like estrogens and birth control pills  isoniazid  ketoconazole  NSAIDS, medicines for pain and inflammation, like ibuprofen or naproxen  phenytoin  rifampin  toxoids  vaccines  warfarin This list may not describe all possible interactions. Give your health care provider a list of all the medicines, herbs, non-prescription drugs, or dietary supplements you use. Also tell them if you smoke, drink alcohol, or use illegal drugs. Some items may interact with your medicine. What should I watch for while using this medicine? Visit your doctor or health care professional for regular checks on your progress. If you are taking this medicine over a prolonged period, carry an identification card with your name and address, the type and dose of your medicine, and your doctor's name and address. This medicine may increase your risk of getting an infection. Tell your doctor or health care professional if you are around anyone with measles or chickenpox, or if you develop sores or blisters that do not heal properly. If you are going to have surgery, tell your doctor or health care professional that you have taken this medicine within the last twelve months. Ask your doctor or health care professional about your diet. You may need to lower the amount of salt you eat. This medicine may increase blood sugar. Ask your healthcare provider if changes in diet or medicines are needed if you have diabetes. What side effects may I notice from receiving this medicine? Side effects that you should report to your doctor  or health care professional as soon as possible:  allergic reactions like skin rash, itching or hives, swelling of the face, lips, or tongue  changes in emotions or moods  changes in vision  depressed mood  eye pain  fever or chills, cough, sore throat, pain or difficulty passing urine  signs and symptoms of high blood sugar such as being more thirsty or hungry or having to urinate more than normal. You may also feel very tired or have blurry vision.  swelling of ankles, feet Side effects that usually do not require medical attention (report to your doctor or health care professional if they continue or are bothersome):  confusion, excitement, restlessness  headache  nausea, vomiting  skin problems, acne, thin and shiny skin  trouble sleeping  weight gain This list may not describe all possible side effects. Call your doctor for medical advice about side effects. You may report side effects to FDA at 1-800-FDA-1088. Where should I keep my medicine? Keep out of the reach of children. Store at room temperature between 15 and 30 degrees C (59 and 86 degrees F). Protect from light. Keep container tightly closed. Throw away any unused medicine after the expiration date. NOTE: This sheet is a summary. It may not cover all possible information. If you have questions about this medicine, talk to your doctor, pharmacist, or health care provider.  2020 Elsevier/Gold Standard (2018-04-11 10:54:22)

## 2019-02-14 DIAGNOSIS — R600 Localized edema: Secondary | ICD-10-CM | POA: Insufficient documentation

## 2019-02-14 DIAGNOSIS — E786 Lipoprotein deficiency: Secondary | ICD-10-CM | POA: Insufficient documentation

## 2019-02-14 DIAGNOSIS — E781 Pure hyperglyceridemia: Secondary | ICD-10-CM | POA: Insufficient documentation

## 2019-02-14 DIAGNOSIS — R7303 Prediabetes: Secondary | ICD-10-CM | POA: Insufficient documentation

## 2019-02-14 DIAGNOSIS — M545 Low back pain, unspecified: Secondary | ICD-10-CM | POA: Insufficient documentation

## 2019-02-14 DIAGNOSIS — E119 Type 2 diabetes mellitus without complications: Secondary | ICD-10-CM | POA: Insufficient documentation

## 2019-02-14 DIAGNOSIS — R609 Edema, unspecified: Secondary | ICD-10-CM | POA: Insufficient documentation

## 2019-02-14 DIAGNOSIS — E1165 Type 2 diabetes mellitus with hyperglycemia: Secondary | ICD-10-CM | POA: Insufficient documentation

## 2019-02-14 DIAGNOSIS — L299 Pruritus, unspecified: Secondary | ICD-10-CM | POA: Insufficient documentation

## 2019-02-14 DIAGNOSIS — R599 Enlarged lymph nodes, unspecified: Secondary | ICD-10-CM | POA: Insufficient documentation

## 2019-02-14 DIAGNOSIS — R21 Rash and other nonspecific skin eruption: Secondary | ICD-10-CM | POA: Insufficient documentation

## 2019-02-14 MED ORDER — FUROSEMIDE 40 MG PO TABS: 40.0000 mg | ORAL_TABLET | Freq: Two times a day (BID) | ORAL | 3 refills | Status: DC | PRN

## 2019-02-15 ENCOUNTER — Telehealth: Payer: Self-pay | Admitting: Family Medicine

## 2019-02-15 ENCOUNTER — Ambulatory Visit (HOSPITAL_COMMUNITY)
Admission: RE | Admit: 2019-02-15 | Discharge: 2019-02-15 | Disposition: A | Discharge status: Home / Self Care | Payer: Self-pay | Source: Ambulatory Visit | Attending: Family Medicine | Admitting: Family Medicine

## 2019-02-15 ENCOUNTER — Other Ambulatory Visit: Payer: Self-pay

## 2019-02-15 ENCOUNTER — Telehealth: Payer: Self-pay | Admitting: Pharmacy Technician

## 2019-02-15 DIAGNOSIS — R599 Enlarged lymph nodes, unspecified: Secondary | ICD-10-CM | POA: Insufficient documentation

## 2019-02-15 NOTE — Telephone Encounter (Signed)
Per request of [facility] To please contact Pt for her to apply for the CAFA, I LVM informed her that am sending the application with a check list of the documentation she need to apply for the [place] program

## 2019-02-15 NOTE — Telephone Encounter (Signed)
Patient does not live in [place].  Patient is seeing Dr. Kathe Becton in [place] with the [facility].  Patient does not meet [facility]'s eligibility criteria.  Contacted patient to make aware that [facility] could not provide medication assistance.    Referred patient to [facility] in [place].  Wynonia Hazard, financial counselor from [facility] in [place] to contact patient to discuss eligibility criteria.  Prescriptions transferred to [place].  [facility]

## 2019-02-16 MED FILL — ROSUVASTATIN CALCIUM 10 MG: 10 | 30 days supply | Qty: 30 | Fill #0

## 2019-02-20 ENCOUNTER — Telehealth: Payer: Self-pay | Admitting: Family Medicine

## 2019-02-20 NOTE — Telephone Encounter (Signed)
Message left on patient's voicemail, reviewing results of recent Ultrasound of left neck area on 02/15/2019.  Patient to report of office if she experiences increased symptoms in area. She is to keep follow up appointment as scheduled.

## 2019-03-05 ENCOUNTER — Telehealth: Payer: Self-pay

## 2019-03-05 ENCOUNTER — Encounter: Payer: Self-pay | Admitting: Family Medicine

## 2019-03-05 NOTE — Telephone Encounter (Signed)
-----   Message from Azzie Glatter, [place] sent at 03/05/2019  8:04 AM EDT ----- Regarding: "Lab Results" Please inform patient that Albumin level is mildly elevated, which can be related to Diabetes. We will continue to monitor. Keep follow up appointment as scheduled.

## 2019-03-05 NOTE — Progress Notes (Signed)
Albumin Levels are elevated at 278.4. Patient called for review. We will continue to monitor.

## 2019-03-05 NOTE — Telephone Encounter (Signed)
Left a vm for patient to callback 

## 2019-03-06 ENCOUNTER — Telehealth: Payer: Self-pay | Admitting: Family Medicine

## 2019-03-06 NOTE — Telephone Encounter (Signed)
Since Pt did not contact us to schedule an appt am sending back the documents she submit and informed him what he need to bring and do for appt

## 2019-03-09 ENCOUNTER — Telehealth: Payer: Self-pay

## 2019-03-09 DIAGNOSIS — E119 Type 2 diabetes mellitus without complications: Secondary | ICD-10-CM

## 2019-03-09 DIAGNOSIS — R609 Edema, unspecified: Secondary | ICD-10-CM

## 2019-03-09 MED ORDER — GLIMEPIRIDE 2 MG PO TABS: 2.0000 mg | ORAL_TABLET | Freq: Every day | ORAL | 1 refills | Status: DC

## 2019-03-09 MED ORDER — POTASSIUM CHLORIDE CRYS ER 20 MEQ PO TBCR: 20.0000 meq | EXTENDED_RELEASE_TABLET | Freq: Every day | ORAL | 1 refills | Status: DC

## 2019-03-09 MED ORDER — FUROSEMIDE 40 MG PO TABS: 40.0000 mg | ORAL_TABLET | Freq: Two times a day (BID) | ORAL | 0 refills | Status: DC | PRN

## 2019-03-09 MED ORDER — ALLOPURINOL 300 MG PO TABS: 300.0000 mg | ORAL_TABLET | Freq: Every day | ORAL | 1 refills | Status: DC

## 2019-03-09 NOTE — Telephone Encounter (Signed)
Medication resent to a different pharmacy

## 2019-03-13 ENCOUNTER — Telehealth: Payer: Self-pay

## 2019-03-14 ENCOUNTER — Telehealth: Payer: Self-pay | Admitting: Family Medicine

## 2019-03-14 ENCOUNTER — Other Ambulatory Visit: Payer: Self-pay | Admitting: Family Medicine

## 2019-03-14 DIAGNOSIS — I10 Essential (primary) hypertension: Secondary | ICD-10-CM

## 2019-03-14 NOTE — Telephone Encounter (Signed)
Refill was sent in the pharmacy for 30 day supply

## 2019-03-14 NOTE — Telephone Encounter (Signed)
Louretta Shorten,  Can you please call this patient?

## 2019-03-14 NOTE — Telephone Encounter (Signed)
error 

## 2019-03-14 NOTE — Telephone Encounter (Signed)
Called and spoke with patient she said that she can no longer can get a her meds for 90 supply any more she is no longer on that program. She said that her last refill for her adalat was in April. And hasn't had any meds since then. Pt is requesting a refill to be sent in because she in out of meds.

## 2019-03-16 ENCOUNTER — Encounter (HOSPITAL_COMMUNITY): Payer: Self-pay

## 2019-06-25 ENCOUNTER — Encounter: Payer: Self-pay | Admitting: Pharmacist

## 2019-08-06 ENCOUNTER — Telehealth: Payer: Self-pay | Admitting: Family Medicine

## 2019-08-07 NOTE — Telephone Encounter (Signed)
Message for Medication refill left on patient's voice mail along with [facility] NP reply.

## 2019-08-07 NOTE — Telephone Encounter (Signed)
Pt said the pharmacy told her that she needs her DR send new scripts for her meds

## 2019-08-08 ENCOUNTER — Other Ambulatory Visit: Payer: Self-pay

## 2019-08-08 DIAGNOSIS — R0602 Shortness of breath: Secondary | ICD-10-CM

## 2019-08-08 DIAGNOSIS — R609 Edema, unspecified: Secondary | ICD-10-CM

## 2019-08-08 DIAGNOSIS — I10 Essential (primary) hypertension: Secondary | ICD-10-CM

## 2019-08-08 MED ORDER — NIFEDIPINE ER 60 MG PO TB24: 60.0000 mg | ORAL_TABLET | Freq: Every day | ORAL | 0 refills | Status: DC

## 2019-08-08 MED ORDER — MONTELUKAST SODIUM 10 MG PO TABS: 10.0000 mg | ORAL_TABLET | Freq: Every day | ORAL | 1 refills | Status: DC

## 2019-08-08 MED ORDER — FUROSEMIDE 40 MG PO TABS: 40.0000 mg | ORAL_TABLET | Freq: Two times a day (BID) | ORAL | 0 refills | Status: DC | PRN

## 2019-08-15 ENCOUNTER — Other Ambulatory Visit: Payer: Self-pay

## 2019-08-15 ENCOUNTER — Encounter: Payer: Self-pay | Admitting: Family Medicine

## 2019-08-15 ENCOUNTER — Ambulatory Visit (INDEPENDENT_AMBULATORY_CARE_PROVIDER_SITE_OTHER): Payer: Self-pay | Admitting: Family Medicine

## 2019-08-15 VITALS — BP 209/97 | HR 89 | Temp 98.1°F | Ht 65.0 in | Wt 298.2 lb

## 2019-08-15 DIAGNOSIS — E786 Lipoprotein deficiency: Secondary | ICD-10-CM

## 2019-08-15 DIAGNOSIS — L299 Pruritus, unspecified: Secondary | ICD-10-CM

## 2019-08-15 DIAGNOSIS — M545 Low back pain, unspecified: Secondary | ICD-10-CM

## 2019-08-15 DIAGNOSIS — E781 Pure hyperglyceridemia: Secondary | ICD-10-CM

## 2019-08-15 DIAGNOSIS — R609 Edema, unspecified: Secondary | ICD-10-CM

## 2019-08-15 DIAGNOSIS — R600 Localized edema: Secondary | ICD-10-CM

## 2019-08-15 DIAGNOSIS — E119 Type 2 diabetes mellitus without complications: Secondary | ICD-10-CM

## 2019-08-15 DIAGNOSIS — R0602 Shortness of breath: Secondary | ICD-10-CM

## 2019-08-15 DIAGNOSIS — R21 Rash and other nonspecific skin eruption: Secondary | ICD-10-CM

## 2019-08-15 DIAGNOSIS — I10 Essential (primary) hypertension: Secondary | ICD-10-CM

## 2019-08-15 DIAGNOSIS — Z0000 Encounter for general adult medical examination without abnormal findings: Secondary | ICD-10-CM

## 2019-08-15 DIAGNOSIS — Z09 Encounter for follow-up examination after completed treatment for conditions other than malignant neoplasm: Secondary | ICD-10-CM

## 2019-08-15 DIAGNOSIS — R7303 Prediabetes: Secondary | ICD-10-CM

## 2019-08-15 MED ORDER — EPINEPHRINE 0.3 MG/0.3ML IJ SOAJ: 0.3000 mg | INTRAMUSCULAR | 3 refills | Status: DC | PRN

## 2019-08-15 MED ORDER — POTASSIUM CHLORIDE CRYS ER 20 MEQ PO TBCR: 20.0000 meq | EXTENDED_RELEASE_TABLET | Freq: Every day | ORAL | 1 refills | Status: DC

## 2019-08-15 MED ORDER — TRIAMCINOLONE ACETONIDE 0.1 % EX CREA: 1.0000 "application " | TOPICAL_CREAM | Freq: Two times a day (BID) | CUTANEOUS | 6 refills | Status: DC

## 2019-08-15 MED ORDER — MELOXICAM 15 MG PO TABS: ORAL_TABLET | ORAL | 1 refills | Status: DC

## 2019-08-15 MED ORDER — FUROSEMIDE 40 MG PO TABS: 40.0000 mg | ORAL_TABLET | Freq: Two times a day (BID) | ORAL | 1 refills | Status: DC | PRN

## 2019-08-15 MED ORDER — FERROUS SULFATE 325 (65 FE) MG PO TABS: 325.0000 mg | ORAL_TABLET | Freq: Every day | ORAL | 1 refills | Status: DC

## 2019-08-15 MED ORDER — CENTRUM SILVER 50+WOMEN PO TABS: 1.0000 | ORAL_TABLET | Freq: Every day | ORAL | 1 refills | Status: DC

## 2019-08-15 MED ORDER — GLIMEPIRIDE 2 MG PO TABS: 2.0000 mg | ORAL_TABLET | Freq: Every day | ORAL | 1 refills | Status: DC

## 2019-08-15 MED ORDER — ALLOPURINOL 300 MG PO TABS: 300.0000 mg | ORAL_TABLET | Freq: Every day | ORAL | 1 refills | Status: DC

## 2019-08-15 MED ORDER — ROSUVASTATIN CALCIUM 10 MG PO TABS: 10.0000 mg | ORAL_TABLET | Freq: Every day | ORAL | 1 refills | Status: DC

## 2019-08-15 MED ORDER — NIFEDIPINE ER 60 MG PO TB24: 60.0000 mg | ORAL_TABLET | Freq: Every day | ORAL | 1 refills | Status: DC

## 2019-08-15 MED ORDER — CYCLOBENZAPRINE HCL 10 MG PO TABS: ORAL_TABLET | ORAL | 6 refills | Status: DC

## 2019-08-15 MED ORDER — HYDROXYZINE HCL 10 MG PO TABS: 10.0000 mg | ORAL_TABLET | Freq: Three times a day (TID) | ORAL | 1 refills | Status: DC | PRN

## 2019-08-15 MED ORDER — FLUTICASONE PROPIONATE 50 MCG/ACT NA SUSP: 2.0000 | Freq: Every day | NASAL | 6 refills | Status: DC | PRN

## 2019-08-15 MED ORDER — ALBUTEROL SULFATE HFA 108 (90 BASE) MCG/ACT IN AERS: 1.0000 | INHALATION_SPRAY | Freq: Four times a day (QID) | RESPIRATORY_TRACT | 12 refills | Status: DC | PRN

## 2019-08-15 MED ORDER — MONTELUKAST SODIUM 10 MG PO TABS: 10.0000 mg | ORAL_TABLET | Freq: Every day | ORAL | 1 refills | Status: DC

## 2019-08-15 MED ORDER — FAMOTIDINE 20 MG PO TABS: 20.0000 mg | ORAL_TABLET | Freq: Two times a day (BID) | ORAL | 1 refills | Status: DC

## 2019-08-15 MED ORDER — ASPIRIN EC 81 MG PO TBEC: 81.0000 mg | DELAYED_RELEASE_TABLET | Freq: Every day | ORAL | 1 refills | Status: AC

## 2019-08-15 NOTE — Progress Notes (Signed)
Patient [facility]   Established Patient Office Visit  Subjective:  Patient ID: Nancy Pham, female    DOB: Dec 02, 1965  Age: 54 y.o. MRN: [id number]  CC:  Chief Complaint  Patient presents with  . Follow-up    6 mth follow up DM  . Rash    Rash on inner left arm, itching    HPI Nancy Pham is a 54 year old female who presents for Follow Up today.   Past Medical History:  Diagnosis Date  . Asthma   . Cancer ([place])   . Diabetes mellitus   . Gout   . Hyperlipidemia   . Hypertension    Current Status: Since her last office visit, she is doing well with no complaints. She denies fatigue, frequent urination, blurred vision, excessive hunger, excessive thirst, weight gain, weight loss, and poor wound healing. She continues to check her feet regularly. She is currently has not been taking her hypertensive medication as prescribed. She denies visual changes, chest pain, cough, shortness of breath, heart palpitations, and falls. She has occasional headaches and dizziness with position changes. Denies severe headaches, confusion, seizures, double vision, and blurred vision, nausea and vomiting. She denies fevers, chills, recent infections, weight loss, and night sweats. No reports of GI problems such as diarrhea, and constipation. She has no reports of blood in stools, dysuria and hematuria. No depression or anxiety reported today. She denies suicidal ideations, homicidal ideations, or auditory hallucinations. She has generalized pain today.   Past Surgical History:  Procedure Laterality Date  . ABDOMINAL HYSTERECTOMY      Family History  Problem Relation Age of Onset  . Seizures Mother   . Heart attack Father   . Heart attack Sister   . Hypertension Sister   . Hypertension Brother   . Hypertension Brother   . Hypertension Brother   . Hypertension Brother   . Hypertension Sister     Social History   Socioeconomic History  . Marital  status: Single    Spouse name: Not on file  . Number of children: Not on file  . Years of education: Not on file  . Highest education level: Not on file  Occupational History  . Not on file  Tobacco Use  . Smoking status: Never Smoker  . Smokeless tobacco: Never Used  Substance and Sexual Activity  . Alcohol use: No  . Drug use: No  . Sexual activity: Not Currently  Other Topics Concern  . Not on file  Social History Narrative  . Not on file   Social Determinants of Health   Financial Resource Strain:   . Difficulty of Paying Living Expenses: Not on file  Food Insecurity:   . Worried About Charity fundraiser in the Last Year: Not on file  . Ran Out of Food in the Last Year: Not on file  Transportation Needs:   . Lack of Transportation (Medical): Not on file  . Lack of Transportation (Non-Medical): Not on file  Physical Activity:   . Days of Exercise per Week: Not on file  . Minutes of Exercise per Session: Not on file  Stress:   . Feeling of Stress : Not on file  Social Connections:   . Frequency of Communication with Friends and Family: Not on file  . Frequency of Social Gatherings with Friends and Family: Not on file  . Attends Religious Services: Not on file  . Active Member of Clubs or Organizations: Not  on file  . Attends Archivist Meetings: Not on file  . Marital Status: Not on file  Intimate Partner Violence:   . Fear of Current or Ex-Partner: Not on file  . Emotionally Abused: Not on file  . Physically Abused: Not on file  . Sexually Abused: Not on file    Outpatient Medications Prior to Visit  Medication Sig Dispense Refill  . acetaminophen (TYLENOL) 325 MG tablet Take 325-650 mg by mouth every 6 (six) hours as needed for mild pain, fever or headache.     . Cholecalciferol (VITAMIN D3) 125 MCG (5000 UT) CAPS Take 1 capsule (5,000 Units total) by mouth daily. 15 capsule 1  . hydrocortisone cream (HYDROCORTISONE ANTI-ITCH) 1 % Apply 1 application  topically 2 (two) times daily. 30 g 3  . Probiotic Product (PROBIOTIC DAILY PO) Take 1 capsule by mouth daily.    Marland Kitchen albuterol (VENTOLIN HFA) 108 (90 Base) MCG/ACT inhaler Inhale 1-2 puffs into the lungs every 6 (six) hours as needed for wheezing or shortness of breath. Please dispense 3 months supple. 8 g 1  . allopurinol (ZYLOPRIM) 300 MG tablet Take 1 tablet (300 mg total) by mouth daily. 90 tablet 1  . aspirin EC 81 MG tablet TAKE ONE TABLET BY MOUTH EVERY DAY 60 tablet 0  . cyclobenzaprine (FLEXERIL) 10 MG tablet TAKE ONE TABLET BY MOUTH THREE TIMES DAILY AS NEEDED FOR MUSCLE SPASMS. 90 tablet 0  . EPINEPHrine 0.3 mg/0.3 mL IJ SOAJ injection Inject 0.3 mLs (0.3 mg total) into the muscle as needed (acute resp failure/worsening anaphylaxis). 3 Device 1  . famotidine (PEPCID) 20 MG tablet Take 1 tablet (20 mg total) by mouth 2 (two) times daily. 180 tablet 1  . ferrous sulfate 325 (65 FE) MG tablet Take 325 mg by mouth daily with breakfast.    . fluticasone (FLONASE) 50 MCG/ACT nasal spray Place 2 sprays into both nostrils daily as needed for allergies or rhinitis. 11.1 mL 6  . furosemide (LASIX) 40 MG tablet Take 1 tablet (40 mg total) by mouth 2 (two) times daily as needed for edema. 180 tablet 0  . glimepiride (AMARYL) 2 MG tablet Take 1 tablet (2 mg total) by mouth daily with breakfast. 90 tablet 1  . hydrOXYzine (ATARAX/VISTARIL) 10 MG tablet Take 1 tablet (10 mg total) by mouth 3 (three) times daily as needed. 30 tablet 3  . Liraglutide (VICTOZA) 18 MG/3ML SOLN injection Inject 1.8 mLs (10.8 mg total) into the skin daily. 50 mL 0  . meloxicam (MOBIC) 15 MG tablet TAKE ONE TABLET BY MOUTH EVERY DAY AS NEEDED FOR PAIN 90 tablet 1  . montelukast (SINGULAIR) 10 MG tablet Take 1 tablet (10 mg total) by mouth at bedtime. 90 tablet 1  . Multiple Vitamins-Minerals (CENTRUM SILVER 50+WOMEN) TABS Take 1 tablet by mouth daily.    Marland Kitchen NIFEdipine (ADALAT CC) 60 MG 24 hr tablet Take 1 tablet (60 mg total) by  mouth daily. 90 tablet 0  . potassium chloride SA (K-DUR) 20 MEQ tablet Take 1 tablet (20 mEq total) by mouth daily. 90 tablet 1  . rosuvastatin (CRESTOR) 10 MG tablet Take 1 tablet (10 mg total) by mouth daily. 90 tablet 1   No facility-administered medications prior to visit.    Allergies  Allergen Reactions  . Ace Inhibitors Swelling    Swelling of the lips  . Tape Rash    No Band-Aids!!    ROS Review of Systems  Constitutional: Negative.   HENT:  Negative.   Eyes: Negative.   Respiratory: Positive for cough (occasional ) and shortness of breath (occasional ).   Cardiovascular: Negative.   Gastrointestinal: Positive for abdominal distention.  Endocrine: Negative.   Genitourinary: Negative.   Musculoskeletal: Positive for arthralgias (generalized joint pain).  Skin: Positive for color change.  Allergic/Immunologic: Negative.   Neurological: Positive for dizziness (occasional) and headaches (occasional ).  Hematological: Negative.   Psychiatric/Behavioral: The patient is nervous/anxious and is hyperactive.       Objective:    Physical Exam  Constitutional: She is oriented to person, place, and time. She appears well-developed and well-nourished.  HENT:  Head: Normocephalic and atraumatic.  Eyes: Conjunctivae are normal.  Cardiovascular: Normal rate, regular rhythm, normal heart sounds and intact distal pulses.  Pulmonary/Chest: Effort normal and breath sounds normal.  Abdominal: Soft. Bowel sounds are normal. She exhibits distension (occasional).  Musculoskeletal:        General: Normal range of motion.     Cervical back: Normal range of motion and neck supple.  Neurological: She is alert and oriented to person, place, and time. She has normal reflexes.  Skin: Skin is warm and dry.  Psychiatric: She has a normal mood and affect. Her behavior is normal. Judgment and thought content normal.  Nursing note and vitals reviewed.   BP (!) 209/97   Pulse 89   Temp 98.1  F (36.7 C) (Oral)   Ht 5\' 5"  (1.651 m)   Wt 298 lb 3.2 oz (135.3 kg)   SpO2 96%   BMI 49.62 kg/m  Wt Readings from Last 3 Encounters:  08/15/19 298 lb 3.2 oz (135.3 kg)  07/25/18 294 lb (133.4 kg)  06/27/18 295 lb (133.8 kg)     Health Maintenance Due  Topic Date Due  . OPHTHALMOLOGY EXAM  02/11/1975  . PAP SMEAR-Modifier  02/10/1986  . COLONOSCOPY  02/11/2015  . MAMMOGRAM  10/23/2017  . FOOT EXAM  12/03/2018  . URINE MICROALBUMIN  12/03/2018  . INFLUENZA VACCINE  02/24/2019  . HEMOGLOBIN A1C  08/15/2019    There are no preventive care reminders to display for this patient.  Lab Results  Component Value Date   TSH 1.710 08/15/2019   Lab Results  Component Value Date   WBC 6.0 08/15/2019   HGB 13.2 08/15/2019   HCT 39.1 08/15/2019   MCV 86 08/15/2019   PLT 340 08/15/2019   Lab Results  Component Value Date   NA 140 08/15/2019   K 3.7 08/15/2019   CO2 27 08/15/2019   GLUCOSE 105 (H) 08/15/2019   BUN 10 08/15/2019   CREATININE 0.74 08/15/2019   BILITOT 0.4 08/15/2019   ALKPHOS 112 08/15/2019   AST 21 08/15/2019   ALT 19 08/15/2019   PROT 7.4 08/15/2019   ALBUMIN 4.3 08/15/2019   CALCIUM 10.2 08/15/2019   ANIONGAP 14 08/21/2017   Lab Results  Component Value Date   CHOL 269 (H) 08/15/2019   Lab Results  Component Value Date   HDL 48 08/15/2019   Lab Results  Component Value Date   LDLCALC 181 (H) 08/15/2019   Lab Results  Component Value Date   TRIG 213 (H) 08/15/2019   Lab Results  Component Value Date   CHOLHDL 5.6 (H) 08/15/2019   Lab Results  Component Value Date   HGBA1C 7.7 (A) 02/12/2019      Assessment & Plan:   1. Essential hypertension She will restart taking all hypertensive medication as prescribed. She will report to ED if  he eperiences severe headaches, confusion, seizures, double vision, and blurred vision, nausea and vomiting. She will continue to take medications as prescribed, to decrease high sodium intake,  excessive alcohol intake, increase potassium intake, smoking cessation, and increase physical activity of at least 30 minutes of cardio activity daily. She will continue to follow Heart Healthy or DASH diet. - CBC with Differential - Comprehensive metabolic panel - Lipid Panel - TSH - Vitamin B12 - Vitamin D, 25-hydroxy - NIFEdipine (ADALAT CC) 60 MG 24 hr tablet; Take 1 tablet (60 mg total) by mouth daily.  Dispense: 90 tablet; Refill: 1 - POCT urinalysis dipstick  2. Type 2 diabetes mellitus without complication, without long-term current use of insulin (HCC) She will continue medication as prescribed, to decrease foods/beverages high in sugars and carbs and follow Heart Healthy or DASH diet. Increase physical activity to at least 30 minutes cardio exercise daily.  - CBC with Differential - Comprehensive metabolic panel - Lipid Panel - TSH - Vitamin B12 - Vitamin D, 25-hydroxy  3. Diabetes mellitus without complication (HCC) - glimepiride (AMARYL) 2 MG tablet; Take 1 tablet (2 mg total) by mouth daily with breakfast.  Dispense: 90 tablet; Refill: 1  4. Hemoglobin A1C between 7% and 9% indicating borderline diabetic control  5. Bilateral low back pain without sciatica, unspecified chronicity - cyclobenzaprine (FLEXERIL) 10 MG tablet; TAKE ONE TABLET BY MOUTH THREE TIMES DAILY AS NEEDED FOR MUSCLE SPASMS.  Dispense: 30 tablet; Refill: 6 - meloxicam (MOBIC) 15 MG tablet; TAKE ONE TABLET BY MOUTH EVERY DAY AS NEEDED FOR PAIN  Dispense: 90 tablet; Refill: 1  6. 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  Dispense: 90 tablet; Refill: 1  7. Edema, peripheral - furosemide (LASIX) 40 MG tablet; Take 1 tablet (40 mg total) by mouth 2 (two) times daily as needed for edema.  Dispense: 180 tablet; Refill: 1  8. Rash - hydrOXYzine (ATARAX/VISTARIL) 10 MG tablet; Take 1  tablet (10 mg total) by mouth 3 (three) times daily as needed.  Dispense: 270 tablet; Refill: 1  9. Itching - hydrOXYzine (ATARAX/VISTARIL) 10 MG tablet; Take 1 tablet (10 mg total) by mouth 3 (three) times daily as needed.  Dispense: 270 tablet; Refill: 1  10. Shortness of breath - montelukast (SINGULAIR) 10 MG tablet; Take 1 tablet (10 mg total) by mouth at bedtime.  Dispense: 90 tablet; Refill: 1  11. Healthcare maintenance - CBC with Differential - Comprehensive metabolic panel - Lipid Panel - TSH - Vitamin B12 - Vitamin D, 25-hydroxy - EPINEPHrine 0.3 mg/0.3 mL IJ SOAJ injection; Inject 0.3 mLs (0.3 mg total) into the muscle as needed (acute resp failure/worsening anaphylaxis).  Dispense: 3 each; Refill: 3  12. Follow up She will follow up in 1 week for blood pressure recheck only. She will follow up in 2 months for office visit.   Meds ordered this encounter  Medications  . albuterol (VENTOLIN HFA) 108 (90 Base) MCG/ACT inhaler    Sig: Inhale 1-2 puffs into the lungs every 6 (six) hours as needed for wheezing or shortness of breath. Please dispense 3 months supple.    Dispense:  8 g    Refill:  12  . allopurinol (ZYLOPRIM) 300 MG tablet    Sig: Take 1 tablet (300 mg total) by mouth daily.    Dispense:  90 tablet    Refill:  1  . aspirin EC 81  MG tablet    Sig: Take 1 tablet (81 mg total) by mouth daily.    Dispense:  90 tablet    Refill:  1  . cyclobenzaprine (FLEXERIL) 10 MG tablet    Sig: TAKE ONE TABLET BY MOUTH THREE TIMES DAILY AS NEEDED FOR MUSCLE SPASMS.    Dispense:  30 tablet    Refill:  6  . EPINEPHrine 0.3 mg/0.3 mL IJ SOAJ injection    Sig: Inject 0.3 mLs (0.3 mg total) into the muscle as needed (acute resp failure/worsening anaphylaxis).    Dispense:  3 each    Refill:  3  . famotidine (PEPCID) 20 MG tablet    Sig: Take 1 tablet (20 mg total) by mouth 2 (two) times daily.    Dispense:  180 tablet    Refill:  1  . ferrous sulfate 325 (65 FE) MG tablet      Sig: Take 1 tablet (325 mg total) by mouth daily with breakfast.    Dispense:  90 tablet    Refill:  1  . fluticasone (FLONASE) 50 MCG/ACT nasal spray    Sig: Place 2 sprays into both nostrils daily as needed for allergies or rhinitis.    Dispense:  11.1 mL    Refill:  6    Please dsipense 3 months supply.  . furosemide (LASIX) 40 MG tablet    Sig: Take 1 tablet (40 mg total) by mouth 2 (two) times daily as needed for edema.    Dispense:  180 tablet    Refill:  1  . glimepiride (AMARYL) 2 MG tablet    Sig: Take 1 tablet (2 mg total) by mouth daily with breakfast.    Dispense:  90 tablet    Refill:  1  . hydrOXYzine (ATARAX/VISTARIL) 10 MG tablet    Sig: Take 1 tablet (10 mg total) by mouth 3 (three) times daily as needed.    Dispense:  270 tablet    Refill:  1  . meloxicam (MOBIC) 15 MG tablet    Sig: TAKE ONE TABLET BY MOUTH EVERY DAY AS NEEDED FOR PAIN    Dispense:  90 tablet    Refill:  1  . montelukast (SINGULAIR) 10 MG tablet    Sig: Take 1 tablet (10 mg total) by mouth at bedtime.    Dispense:  90 tablet    Refill:  1  . Multiple Vitamins-Minerals (CENTRUM SILVER 50+WOMEN) TABS    Sig: Take 1 tablet by mouth daily.    Dispense:  90 tablet    Refill:  1  . NIFEdipine (ADALAT CC) 60 MG 24 hr tablet    Sig: Take 1 tablet (60 mg total) by mouth daily.    Dispense:  90 tablet    Refill:  1  . potassium chloride SA (KLOR-CON) 20 MEQ tablet    Sig: Take 1 tablet (20 mEq total) by mouth daily.    Dispense:  90 tablet    Refill:  1  . rosuvastatin (CRESTOR) 10 MG tablet    Sig: Take 1 tablet (10 mg total) by mouth daily.    Dispense:  90 tablet    Refill:  1  . triamcinolone cream (KENALOG) 0.1 %    Sig: Apply 1 application topically 2 (two) times daily.    Dispense:  30 g    Refill:  6    Orders Placed This Encounter  Procedures  . CBC with Differential  . Comprehensive metabolic panel  . Lipid Panel  .  TSH  . Vitamin B12  . Vitamin D, 25-hydroxy  . POCT  urinalysis dipstick    Referral Orders  No referral(s) requested today    Kathe Becton,  MSN, FNP-BC [place], [place] [address] [phone] fax  Problem List Items Addressed This Visit      Cardiovascular and Mediastinum   Hypertension - Primary   Relevant Medications   aspirin EC 81 MG tablet   EPINEPHrine 0.3 mg/0.3 mL IJ SOAJ injection   furosemide (LASIX) 40 MG tablet   NIFEdipine (ADALAT CC) 60 MG 24 hr tablet   rosuvastatin (CRESTOR) 10 MG tablet   Other Relevant Orders   CBC with Differential (Completed)   Comprehensive metabolic panel (Completed)   Lipid Panel (Completed)   TSH (Completed)   Vitamin B12 (Completed)   Vitamin D, 25-hydroxy (Completed)   POCT urinalysis dipstick (Completed)     Endocrine   Diabetes mellitus without complication (HCC)   Relevant Medications   aspirin EC 81 MG tablet   glimepiride (AMARYL) 2 MG tablet   rosuvastatin (CRESTOR) 10 MG tablet   Hemoglobin A1C between 7% and 9% indicating borderline diabetic control   Relevant Medications   aspirin EC 81 MG tablet   glimepiride (AMARYL) 2 MG tablet   rosuvastatin (CRESTOR) 10 MG tablet     Musculoskeletal and Integument   Rash   Relevant Medications   hydrOXYzine (ATARAX/VISTARIL) 10 MG tablet     Other   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   Relevant Medications   aspirin EC 81 MG tablet   EPINEPHrine 0.3 mg/0.3 mL IJ SOAJ injection   furosemide (LASIX) 40 MG tablet   NIFEdipine (ADALAT CC) 60 MG 24 hr tablet   rosuvastatin (CRESTOR) 10 MG tablet   Bilateral low back pain without sciatica   Relevant Medications   aspirin EC 81 MG tablet   cyclobenzaprine (FLEXERIL) 10 MG tablet   meloxicam (MOBIC) 15 MG tablet   Edema, peripheral   Relevant Medications   furosemide (LASIX) 40 MG tablet   Itching   Relevant  Medications   hydrOXYzine (ATARAX/VISTARIL) 10 MG tablet    Other Visit Diagnoses    Type 2 diabetes mellitus without complication, without long-term current use of insulin (HCC)       Relevant Medications   aspirin EC 81 MG tablet   glimepiride (AMARYL) 2 MG tablet   rosuvastatin (CRESTOR) 10 MG tablet   Other Relevant Orders   CBC with Differential (Completed)   Comprehensive metabolic panel (Completed)   Lipid Panel (Completed)   TSH (Completed)   Vitamin B12 (Completed)   Vitamin D, 25-hydroxy (Completed)   Shortness of breath       Relevant Medications   montelukast (SINGULAIR) 10 MG tablet   Healthcare maintenance       Relevant Medications   EPINEPHrine 0.3 mg/0.3 mL IJ SOAJ injection   Other Relevant Orders   CBC with Differential (Completed)   Comprehensive metabolic panel (Completed)   Lipid Panel (Completed)   TSH (Completed)   Vitamin B12 (Completed)   Vitamin D, 25-hydroxy (Completed)   Follow up          Meds ordered this encounter  Medications  . albuterol (VENTOLIN HFA) 108 (90 Base) MCG/ACT inhaler    Sig: Inhale 1-2 puffs into the lungs every 6 (six) hours as needed for wheezing or shortness of breath. Please dispense  3 months supple.    Dispense:  8 g    Refill:  12  . allopurinol (ZYLOPRIM) 300 MG tablet    Sig: Take 1 tablet (300 mg total) by mouth daily.    Dispense:  90 tablet    Refill:  1  . aspirin EC 81 MG tablet    Sig: Take 1 tablet (81 mg total) by mouth daily.    Dispense:  90 tablet    Refill:  1  . cyclobenzaprine (FLEXERIL) 10 MG tablet    Sig: TAKE ONE TABLET BY MOUTH THREE TIMES DAILY AS NEEDED FOR MUSCLE SPASMS.    Dispense:  30 tablet    Refill:  6  . EPINEPHrine 0.3 mg/0.3 mL IJ SOAJ injection    Sig: Inject 0.3 mLs (0.3 mg total) into the muscle as needed (acute resp failure/worsening anaphylaxis).    Dispense:  3 each    Refill:  3  . famotidine (PEPCID) 20 MG tablet    Sig: Take 1 tablet (20 mg total) by mouth 2 (two)  times daily.    Dispense:  180 tablet    Refill:  1  . ferrous sulfate 325 (65 FE) MG tablet    Sig: Take 1 tablet (325 mg total) by mouth daily with breakfast.    Dispense:  90 tablet    Refill:  1  . fluticasone (FLONASE) 50 MCG/ACT nasal spray    Sig: Place 2 sprays into both nostrils daily as needed for allergies or rhinitis.    Dispense:  11.1 mL    Refill:  6    Please dsipense 3 months supply.  . furosemide (LASIX) 40 MG tablet    Sig: Take 1 tablet (40 mg total) by mouth 2 (two) times daily as needed for edema.    Dispense:  180 tablet    Refill:  1  . glimepiride (AMARYL) 2 MG tablet    Sig: Take 1 tablet (2 mg total) by mouth daily with breakfast.    Dispense:  90 tablet    Refill:  1  . hydrOXYzine (ATARAX/VISTARIL) 10 MG tablet    Sig: Take 1 tablet (10 mg total) by mouth 3 (three) times daily as needed.    Dispense:  270 tablet    Refill:  1  . meloxicam (MOBIC) 15 MG tablet    Sig: TAKE ONE TABLET BY MOUTH EVERY DAY AS NEEDED FOR PAIN    Dispense:  90 tablet    Refill:  1  . montelukast (SINGULAIR) 10 MG tablet    Sig: Take 1 tablet (10 mg total) by mouth at bedtime.    Dispense:  90 tablet    Refill:  1  . Multiple Vitamins-Minerals (CENTRUM SILVER 50+WOMEN) TABS    Sig: Take 1 tablet by mouth daily.    Dispense:  90 tablet    Refill:  1  . NIFEdipine (ADALAT CC) 60 MG 24 hr tablet    Sig: Take 1 tablet (60 mg total) by mouth daily.    Dispense:  90 tablet    Refill:  1  . potassium chloride SA (KLOR-CON) 20 MEQ tablet    Sig: Take 1 tablet (20 mEq total) by mouth daily.    Dispense:  90 tablet    Refill:  1  . rosuvastatin (CRESTOR) 10 MG tablet    Sig: Take 1 tablet (10 mg total) by mouth daily.    Dispense:  90 tablet    Refill:  1  . triamcinolone  cream (KENALOG) 0.1 %    Sig: Apply 1 application topically 2 (two) times daily.    Dispense:  30 g    Refill:  6    Follow-up: Return in about 2 weeks (around 08/29/2019).    Azzie Glatter, FNP

## 2019-08-15 NOTE — Patient Instructions (Signed)
Psoriasis Psoriasis is a long-term (chronic) skin condition. It occurs because your body's defense system (immune system) causes skin cells to form too quickly. This causes raised, red patches (plaques) on your skin that look silvery. The patches may be on all areas of your body. They can be any size or shape. Psoriasis can come and go. It can range from mild to very bad. It cannot be passed from one person to another (is not contagious). There is no cure for this condition, but it can be helped with treatment. What are the causes? The cause of psoriasis is not known. Some things can make it worse. These are:  Skin damage, such as cuts, scrapes, sunburn, and dryness.  Not getting enough sunlight.  Some medicines.  Alcohol.  Tobacco.  Stress.  Infections. What increases the risk?  Having a family member with psoriasis.  Being very overweight (obese).  Being 20-40 years old.  Taking certain medicines. What are the signs or symptoms? There are different types of psoriasis. The types are:  Plaque. This is the most common. Symptoms include red, raised patches with a silvery coating. These may be itchy. Your nails may be crumbly or fall off.  Guttate. Symptoms include small red spots on your stomach area, arms, and legs. These may happen after you have been sick, such as with strep throat.  Inverse. Symptoms include patches in your armpits, under your breasts, private areas, or on your butt.  Pustular. Symptoms include pus-filled bumps on the palms of your hands or the soles of your feet. You also may feel very tired, weak, have a fever, and not be hungry.  Erythrodermic. Symptoms include bright red skin that looks burned. You may have a fast heartbeat and a body temperature that is too high or too low. You may be itchy or in pain.  Sebopsoriasis. Symptoms include red patches on your scalp, forehead, and face that are greasy.  Psoriatic arthritis. Symptoms include swollen,  painful joints along with scaly skin patches. How is this treated? There is no cure for this condition, but treatment can:  Help your skin heal.  Lessen itching and irritation and swelling (inflammation).  Slow the growth of new skin cells.  Help your body's defense system respond better to your skin. Treatment may include:  Creams or ointments.  Light therapy. This may include natural sunlight or light therapy in a doctor's office.  Medicines. These can help your body better manage skin cells. They may be used with light therapy or ointments. Medicines may include pills or injections. You may also get antibiotic medicines if you have an infection. Follow these instructions at home: Skin Care  Apply lotion to your skin as needed. Only use those that your doctor has said are okay.  Apply cool, wet cloths (cold compresses) to the affected areas.  Do not use a hot tub or take hot showers. Use slightly warm, not hot, water when taking showers and baths.  Do not scratch your skin. Lifestyle   Do not use any products that contain nicotine or tobacco, such as cigarettes, e-cigarettes, and chewing tobacco. If you need help quitting, ask your doctor.  Lower your stress.  Keep a healthy weight.  Go out in the sun as told by your doctor. Do not get sunburned.  Join a support group. Medicines  Take or use over-the-counter and prescription medicines only as told by your doctor.  If you were prescribed an antibiotic medicine, take it as told by your doctor.   Do not stop using the antibiotic even if you start to feel better. Alcohol use If you drink alcohol:  Limit how much you use: ? 0-1 drink a day for women. ? 0-2 drinks a day for men.  Be aware of how much alcohol is in your drink. In the [place] one drink equals one 12 oz bottle of beer (355 mL), one 5 oz glass of wine (148 mL), or one 1 oz glass of hard liquor (44 mL). General instructions  Keep a journal to track the  things that cause symptoms (triggers). Try to avoid these things.  See a counselor if you feel the support would help.  Keep all follow-up visits as told by your doctor. This is important. Contact a doctor if:  You have a fever.  Your pain gets worse.  You have more redness or warmth in the affected areas.  You have new or worse pain or stiffness in your joints.  Your nails start to break easily or pull away from the nail bed.  You feel very sad (depressed). Summary  Psoriasis is a long-term (chronic) skin condition.  There is no cure for this condition, but treatment can help manage it.  Keep a journal to track the things that cause symptoms.  Take or use over-the-counter and prescription medicines only as told by your doctor.  Keep all follow-up visits as told by your doctor. This is important. This information is not intended to replace advice given to you by your health care provider. Make sure you discuss any questions you have with your health care provider. Document Revised: 05/16/2018 Document Reviewed: 05/16/2018 Elsevier Patient Education  [place]. Triamcinolone skin cream, ointment, lotion, or aerosol What is this medicine? TRIAMCINOLONE (trye am SIN oh lone) is a corticosteroid. It is used on the skin to reduce swelling, redness, itching, and allergic reactions. This medicine may be used for other purposes; ask your health care provider or pharmacist if you have questions. COMMON BRAND NAME(S): Aristocort, Aristocort A, Aristocort HP, Cinalog, Cinolar, DERMASORB TA Complete, Flutex, Kenalog, Pediaderm TA, Sila III, SP Rx 228, Triacet, Trianex, Triderm What should I tell my health care provider before I take this medicine? They need to know if you have any of these conditions:  any active infection  large areas of burned or damaged skin  skin wasting or thinning  an unusual or allergic reaction to triamcinolone, corticosteroids, other medicines,  foods, dyes, or preservatives  pregnant or trying to get pregnant  breast-feeding How should I use this medicine? This medicine is for external use only. Do not take by mouth. Follow the directions on the prescription label. Wash your hands before and after use. Apply a thin film of medicine to the affected area. Do not cover with a bandage or dressing unless your doctor or health care professional tells you to. Do not use on healthy skin or over large areas of skin. Do not get this medicine in your eyes. If you do, rinse out with plenty of cool tap water. It is important not to use more medicine than prescribed. Do not use your medicine more often than directed. Talk to your pediatrician regarding the use of this medicine in children. Special care may be needed. Elderly patients are more likely to have damaged skin through aging, and this may increase side effects. This medicine should only be used for brief periods and infrequently in older patients. Overdosage: If you think you have taken too much of this medicine  contact a poison control center or emergency room at once. NOTE: This medicine is only for you. Do not share this medicine with others. What if I miss a dose? If you miss a dose, use it as soon as you can. If it is almost time for your next dose, use only that dose. Do not use double or extra doses. What may interact with this medicine? Interactions are not expected. This list may not describe all possible interactions. Give your health care provider a list of all the medicines, herbs, non-prescription drugs, or dietary supplements you use. Also tell them if you smoke, drink alcohol, or use illegal drugs. Some items may interact with your medicine. What should I watch for while using this medicine? Tell your doctor or health care professional if your symptoms do not start to get better within one week. Do not use for more than 14 days. Do not use on healthy skin or over large areas of  skin. Tell your doctor or health care professional if you are exposed to anyone with measles or chickenpox, or if you develop sores or blisters that do not heal properly. Do not use an airtight bandage to cover the affected area unless your doctor or health care professional tells you to. If you are to cover the area, follow the instructions carefully. Covering the area where the medicine is applied can increase the amount that passes through the skin and increases the risk of side effects. If treating the diaper area of a child, avoid covering the treated area with tight-fitting diapers or plastic pants. This may increase the amount of medicine that passes through the skin and increase the risk of serious side effects. What side effects may I notice from receiving this medicine? Side effects that you should report to your doctor or health care professional as soon as possible:  burning or itching of the skin  dark red spots on the skin  infection  painful, red, pus filled blisters in hair follicles  thinning of the skin, sunburn more likely especially on the face Side effects that usually do not require medical attention (report to your doctor or health care professional if they continue or are bothersome):  dry skin, irritation  unusual increased growth of hair on the face or body This list may not describe all possible side effects. Call your doctor for medical advice about side effects. You may report side effects to FDA at 1-800-FDA-1088. Where should I keep my medicine? Keep out of the reach of children. Store at room temperature between 15 and 30 degrees C (59 and 86 degrees F). Do not freeze. Throw away any unused medicine after the expiration date. NOTE: This sheet is a summary. It may not cover all possible information. If you have questions about this medicine, talk to your doctor, pharmacist, or health care provider.  2020 Elsevier/Gold Standard (2018-04-13 10:50:30)

## 2019-08-16 LAB — CBC WITH DIFFERENTIAL/PLATELET
Basophils Absolute: 0 10*3/uL (ref 0.0–0.2)
Basos: 0 %
EOS (ABSOLUTE): 0.2 10*3/uL (ref 0.0–0.4)
Eos: 3 %
Hematocrit: 39.1 % (ref 34.0–46.6)
Hemoglobin: 13.2 g/dL (ref 11.1–15.9)
Immature Grans (Abs): 0 10*3/uL (ref 0.0–0.1)
Immature Granulocytes: 0 %
Lymphocytes Absolute: 2.4 10*3/uL (ref 0.7–3.1)
Lymphs: 40 %
MCH: 29.1 pg (ref 26.6–33.0)
MCHC: 33.8 g/dL (ref 31.5–35.7)
MCV: 86 fL (ref 79–97)
Monocytes Absolute: 0.5 10*3/uL (ref 0.1–0.9)
Monocytes: 8 %
Neutrophils Absolute: 2.9 10*3/uL (ref 1.4–7.0)
Neutrophils: 49 %
Platelets: 340 10*3/uL (ref 150–450)
RBC: 4.54 x10E6/uL (ref 3.77–5.28)
RDW: 13.7 % (ref 11.7–15.4)
WBC: 6 10*3/uL (ref 3.4–10.8)

## 2019-08-16 LAB — COMPREHENSIVE METABOLIC PANEL
ALT: 19 IU/L (ref 0–32)
AST: 21 IU/L (ref 0–40)
Albumin/Globulin Ratio: 1.4 (ref 1.2–2.2)
Albumin: 4.3 g/dL (ref 3.8–4.9)
Alkaline Phosphatase: 112 IU/L (ref 39–117)
BUN/Creatinine Ratio: 14 (ref 9–23)
BUN: 10 mg/dL (ref 6–24)
Bilirubin Total: 0.4 mg/dL (ref 0.0–1.2)
CO2: 27 mmol/L (ref 20–29)
Calcium: 10.2 mg/dL (ref 8.7–10.2)
Chloride: 99 mmol/L (ref 96–106)
Creatinine, Ser: 0.74 mg/dL (ref 0.57–1.00)
GFR calc Af Amer: 106 mL/min/{1.73_m2} (ref >59)
GFR calc non Af Amer: 92 mL/min/{1.73_m2} (ref >59)
Globulin, Total: 3.1 g/dL (ref 1.5–4.5)
Glucose: 105 mg/dL — ABNORMAL HIGH (ref 65–99)
Potassium: 3.7 mmol/L (ref 3.5–5.2)
Sodium: 140 mmol/L (ref 134–144)
Total Protein: 7.4 g/dL (ref 6.0–8.5)

## 2019-08-16 LAB — LIPID PANEL
Chol/HDL Ratio: 5.6 ratio — ABNORMAL HIGH (ref 0.0–4.4)
Cholesterol, Total: 269 mg/dL — ABNORMAL HIGH (ref 100–199)
HDL: 48 mg/dL (ref >39)
LDL Chol Calc (NIH): 181 mg/dL — ABNORMAL HIGH (ref 0–99)
Triglycerides: 213 mg/dL — ABNORMAL HIGH (ref 0–149)
VLDL Cholesterol Cal: 40 mg/dL (ref 5–40)

## 2019-08-16 LAB — VITAMIN B12: Vitamin B-12: 777 pg/mL (ref 232–1245)

## 2019-08-16 LAB — POCT URINALYSIS DIPSTICK
Glucose, UA: NEGATIVE
Leukocytes, UA: NEGATIVE
Nitrite, UA: NEGATIVE
Protein, UA: POSITIVE — AB
Spec Grav, UA: >=1.03 — AB (ref 1.010–1.025)
Urobilinogen, UA: 0.2 E.U./dL
pH, UA: 6 (ref 5.0–8.0)

## 2019-08-16 LAB — VITAMIN D 25 HYDROXY (VIT D DEFICIENCY, FRACTURES): Vit D, 25-Hydroxy: 38.5 ng/mL (ref 30.0–100.0)

## 2019-08-16 LAB — TSH: TSH: 1.71 u[IU]/mL (ref 0.450–4.500)

## 2019-09-14 ENCOUNTER — Ambulatory Visit (INDEPENDENT_AMBULATORY_CARE_PROVIDER_SITE_OTHER): Payer: Self-pay | Admitting: Family Medicine

## 2019-09-14 ENCOUNTER — Encounter: Payer: Self-pay | Admitting: Family Medicine

## 2019-09-14 ENCOUNTER — Other Ambulatory Visit: Payer: Self-pay

## 2019-09-14 VITALS — BP 128/86 | HR 90 | Temp 98.2°F | Ht 65.0 in | Wt 300.2 lb

## 2019-09-14 DIAGNOSIS — I10 Essential (primary) hypertension: Secondary | ICD-10-CM

## 2019-09-14 DIAGNOSIS — R600 Localized edema: Secondary | ICD-10-CM

## 2019-09-14 DIAGNOSIS — Z09 Encounter for follow-up examination after completed treatment for conditions other than malignant neoplasm: Secondary | ICD-10-CM

## 2019-09-14 DIAGNOSIS — Z5320 Procedure and treatment not carried out because of patient's decision for unspecified reasons: Secondary | ICD-10-CM

## 2019-09-14 DIAGNOSIS — R0602 Shortness of breath: Secondary | ICD-10-CM

## 2019-09-14 DIAGNOSIS — E119 Type 2 diabetes mellitus without complications: Secondary | ICD-10-CM

## 2019-09-14 DIAGNOSIS — Z0189 Encounter for other specified special examinations: Secondary | ICD-10-CM

## 2019-09-14 DIAGNOSIS — R7303 Prediabetes: Secondary | ICD-10-CM

## 2019-09-14 DIAGNOSIS — R609 Edema, unspecified: Secondary | ICD-10-CM

## 2019-09-14 LAB — POCT URINALYSIS DIPSTICK
Bilirubin, UA: NEGATIVE
Blood, UA: NEGATIVE
Glucose, UA: NEGATIVE
Ketones, UA: NEGATIVE
Leukocytes, UA: NEGATIVE
Nitrite, UA: NEGATIVE
Protein, UA: POSITIVE — AB
Spec Grav, UA: 1.025 (ref 1.010–1.025)
Urobilinogen, UA: 0.2 E.U./dL
pH, UA: 6 (ref 5.0–8.0)

## 2019-09-14 LAB — POCT GLYCOSYLATED HEMOGLOBIN (HGB A1C): Hemoglobin A1C: 7.6 % — AB (ref 4.0–5.6)

## 2019-09-14 LAB — GLUCOSE, POCT (MANUAL RESULT ENTRY): POC Glucose: 155 mg/dl — AB (ref 70–99)

## 2019-09-14 MED ORDER — NIFEDIPINE ER OSMOTIC RELEASE 90 MG PO TB24: 90.0000 mg | ORAL_TABLET | Freq: Every day | ORAL | 3 refills | Status: DC

## 2019-09-14 MED ORDER — CLONIDINE HCL 0.1 MG PO TABS
0.3000 mg | ORAL_TABLET | Freq: Once | ORAL | Status: AC
  Administered 2019-09-14: 0.3 mg via ORAL

## 2019-09-14 NOTE — Progress Notes (Signed)
Patient [facility]   Established Patient Office Visit  Subjective:  Patient ID: Nancy Pham, female    DOB: 1966/02/27  Age: 54 y.o. MRN: [id number]  CC:  Chief Complaint  Patient presents with  . Follow-up    DM    HPI Nancy Pham is a 54 year old female who presents for Follow Up today.   Past Medical History:  Diagnosis Date  . Asthma   . Cancer ([place])   . Diabetes mellitus   . Gout   . Hyperlipidemia   . Hypertension    Current Status: Since her last office visit, he is doing well with no complaints. She states that she has had occasional scant nose bleeds. Her blood pressure readings are elevated today. She states that she just took her blood pressure medications within the past hour. She does monitor her blood pressures at home and states that home readings are also elevated. She states that her blood pressures are also elevated when home monitoring levels. She states that she is taking hypertensive medications as prescribed. He denies visual changes, chest pain, cough, shortness of breath, heart palpitations, and falls. He has occasional headaches and dizziness with position changes. Denies severe headaches, confusion, seizures, double vision, and blurred vision, nausea and vomiting. She has not been monitoring her blood glucose levels regularly lately.  She denies fatigue, frequent urination, blurred vision, excessive hunger, excessive thirst, weight gain, weight loss, and poor wound healing. She/ continues to check her feet regularly. She denies fevers, chills, recent infections, weight loss, and night sweats. No reports of GI problems such as diarrhea, and constipation. She has no reports of blood in stools, dysuria and hematuria. No depression or anxiety reported today. She denies suicidal ideations, homicidal ideations, or auditory hallucinations. She denies pain today.   Past Surgical History:  Procedure Laterality Date  .  ABDOMINAL HYSTERECTOMY      Family History  Problem Relation Age of Onset  . Seizures Mother   . Heart attack Father   . Heart attack Sister   . Hypertension Sister   . Hypertension Brother   . Hypertension Brother   . Hypertension Brother   . Hypertension Brother   . Hypertension Sister     Social History   Socioeconomic History  . Marital status: Single    Spouse name: Not on file  . Number of children: Not on file  . Years of education: Not on file  . Highest education level: Not on file  Occupational History  . Not on file  Tobacco Use  . Smoking status: Never Smoker  . Smokeless tobacco: Never Used  Substance and Sexual Activity  . Alcohol use: No  . Drug use: No  . Sexual activity: Not Currently  Other Topics Concern  . Not on file  Social History Narrative  . Not on file   Social Determinants of Health   Financial Resource Strain:   . Difficulty of Paying Living Expenses: Not on file  Food Insecurity:   . Worried About Charity fundraiser in the Last Year: Not on file  . Ran Out of Food in the Last Year: Not on file  Transportation Needs:   . Lack of Transportation (Medical): Not on file  . Lack of Transportation (Non-Medical): Not on file  Physical Activity:   . Days of Exercise per Week: Not on file  . Minutes of Exercise per Session: Not on file  Stress:   .  Feeling of Stress : Not on file  Social Connections:   . Frequency of Communication with Friends and Family: Not on file  . Frequency of Social Gatherings with Friends and Family: Not on file  . Attends Religious Services: Not on file  . Active Member of Clubs or Organizations: Not on file  . Attends Archivist Meetings: Not on file  . Marital Status: Not on file  Intimate Partner Violence:   . Fear of Current or Ex-Partner: Not on file  . Emotionally Abused: Not on file  . Physically Abused: Not on file  . Sexually Abused: Not on file    Outpatient Medications Prior to Visit    Medication Sig Dispense Refill  . acetaminophen (TYLENOL) 325 MG tablet Take 325-650 mg by mouth every 6 (six) hours as needed for mild pain, fever or headache.     . albuterol (VENTOLIN HFA) 108 (90 Base) MCG/ACT inhaler Inhale 1-2 puffs into the lungs every 6 (six) hours as needed for wheezing or shortness of breath. Please dispense 3 months supple. 8 g 12  . allopurinol (ZYLOPRIM) 300 MG tablet Take 1 tablet (300 mg total) by mouth daily. 90 tablet 1  . aspirin EC 81 MG tablet Take 1 tablet (81 mg total) by mouth daily. 90 tablet 1  . Cholecalciferol (VITAMIN D3) 125 MCG (5000 UT) CAPS Take 1 capsule (5,000 Units total) by mouth daily. 15 capsule 1  . cyclobenzaprine (FLEXERIL) 10 MG tablet TAKE ONE TABLET BY MOUTH THREE TIMES DAILY AS NEEDED FOR MUSCLE SPASMS. 30 tablet 6  . famotidine (PEPCID) 20 MG tablet Take 1 tablet (20 mg total) by mouth 2 (two) times daily. 180 tablet 1  . ferrous sulfate 325 (65 FE) MG tablet Take 1 tablet (325 mg total) by mouth daily with breakfast. 90 tablet 1  . fluticasone (FLONASE) 50 MCG/ACT nasal spray Place 2 sprays into both nostrils daily as needed for allergies or rhinitis. 11.1 mL 6  . furosemide (LASIX) 40 MG tablet Take 1 tablet (40 mg total) by mouth 2 (two) times daily as needed for edema. 180 tablet 1  . glimepiride (AMARYL) 2 MG tablet Take 1 tablet (2 mg total) by mouth daily with breakfast. 90 tablet 1  . hydrocortisone cream (HYDROCORTISONE ANTI-ITCH) 1 % Apply 1 application topically 2 (two) times daily. 30 g 3  . meloxicam (MOBIC) 15 MG tablet TAKE ONE TABLET BY MOUTH EVERY DAY AS NEEDED FOR PAIN 90 tablet 1  . montelukast (SINGULAIR) 10 MG tablet Take 1 tablet (10 mg total) by mouth at bedtime. 90 tablet 1  . Multiple Vitamins-Minerals (CENTRUM SILVER 50+WOMEN) TABS Take 1 tablet by mouth daily. 90 tablet 1  . potassium chloride SA (KLOR-CON) 20 MEQ tablet Take 1 tablet (20 mEq total) by mouth daily. 90 tablet 1  . Probiotic Product (PROBIOTIC  DAILY PO) Take 1 capsule by mouth daily.    . rosuvastatin (CRESTOR) 10 MG tablet Take 1 tablet (10 mg total) by mouth daily. 90 tablet 1  . triamcinolone cream (KENALOG) 0.1 % Apply 1 application topically 2 (two) times daily. 30 g 6  . NIFEdipine (ADALAT CC) 60 MG 24 hr tablet Take 1 tablet (60 mg total) by mouth daily. 90 tablet 1  . EPINEPHrine 0.3 mg/0.3 mL IJ SOAJ injection Inject 0.3 mLs (0.3 mg total) into the muscle as needed (acute resp failure/worsening anaphylaxis). 3 each 3  . hydrOXYzine (ATARAX/VISTARIL) 10 MG tablet Take 1 tablet (10 mg total) by mouth  3 (three) times daily as needed. 270 tablet 1   No facility-administered medications prior to visit.    Allergies  Allergen Reactions  . Ace Inhibitors Swelling    Swelling of the lips  . Tape Rash    No Band-Aids!!    ROS Review of Systems  Constitutional: Negative.   HENT: Negative.   Eyes: Negative.   Respiratory: Negative.   Cardiovascular: Negative.   Gastrointestinal: Negative.   Endocrine: Negative.   Genitourinary: Negative.   Musculoskeletal: Negative.   Skin: Negative.        Eczema   Allergic/Immunologic: Negative.   Neurological: Positive for dizziness (occasional) and headaches (occasional ).  Hematological: Negative.   Psychiatric/Behavioral: Negative.       Objective:    Physical Exam  Constitutional: She is oriented to person, place, and time. She appears well-developed and well-nourished.  HENT:  Head: Normocephalic and atraumatic.  Eyes: Conjunctivae are normal.  Cardiovascular: Normal rate, regular rhythm, normal heart sounds and intact distal pulses.  Pulmonary/Chest: Effort normal and breath sounds normal.  Abdominal: Soft. Bowel sounds are normal. She exhibits distension (obese).  Musculoskeletal:        General: Edema (bilateral lower extremity ) present.     Cervical back: Normal range of motion and neck supple.  Neurological: She is alert and oriented to person, place, and  time.  Decreased sensitivity bilaterally in feet.   Skin: Skin is warm and dry.  Psychiatric: She has a normal mood and affect. Her behavior is normal. Thought content normal.  Nursing note and vitals reviewed.   BP 128/86   Pulse 90   Temp 98.2 F (36.8 C)   Ht 5\' 5"  (1.651 m)   Wt (!) 300 lb 3.2 oz (136.2 kg)   SpO2 98%   BMI 49.96 kg/m  Wt Readings from Last 3 Encounters:  09/14/19 (!) 300 lb 3.2 oz (136.2 kg)  08/15/19 298 lb 3.2 oz (135.3 kg)  07/25/18 294 lb (133.4 kg)     Health Maintenance Due  Topic Date Due  . OPHTHALMOLOGY EXAM  02/11/1975  . PAP SMEAR-Modifier  02/10/1986  . COLONOSCOPY  02/11/2015  . MAMMOGRAM  10/23/2017  . FOOT EXAM  12/03/2018    There are no preventive care reminders to display for this patient.  Lab Results  Component Value Date   TSH 1.710 08/15/2019   Lab Results  Component Value Date   WBC 6.0 08/15/2019   HGB 13.2 08/15/2019   HCT 39.1 08/15/2019   MCV 86 08/15/2019   PLT 340 08/15/2019   Lab Results  Component Value Date   NA 140 08/15/2019   K 3.7 08/15/2019   CO2 27 08/15/2019   GLUCOSE 105 (H) 08/15/2019   BUN 10 08/15/2019   CREATININE 0.74 08/15/2019   BILITOT 0.4 08/15/2019   ALKPHOS 112 08/15/2019   AST 21 08/15/2019   ALT 19 08/15/2019   PROT 7.4 08/15/2019   ALBUMIN 4.3 08/15/2019   CALCIUM 10.2 08/15/2019   ANIONGAP 14 08/21/2017   Lab Results  Component Value Date   CHOL 269 (H) 08/15/2019   Lab Results  Component Value Date   HDL 48 08/15/2019   Lab Results  Component Value Date   LDLCALC 181 (H) 08/15/2019   Lab Results  Component Value Date   TRIG 213 (H) 08/15/2019   Lab Results  Component Value Date   CHOLHDL 5.6 (H) 08/15/2019   Lab Results  Component Value Date   HGBA1C 7.6 (A)  09/14/2019   Assessment & Plan:   1. Accelerated hypertension Stabilized today. She will report to ED if he eperiences severe headaches, confusion, seizures, double vision, and blurred vision,  nausea and vomiting. - cloNIDine (CATAPRES) tablet 0.3 mg  2. Type 2 diabetes mellitus without complication, without long-term current use of insulin (HCC) She will continue medication as prescribed, to decrease foods/beverages high in sugars and carbs and follow Heart Healthy or DASH diet. Increase physical activity to at least 30 minutes cardio exercise daily.  - POCT glycosylated hemoglobin (Hb A1C) - POCT urinalysis dipstick - POCT glucose (manual entry)  3. Essential hypertension We will increase dosage of Nifedipine today. She will continue to take medications as prescribed, to decrease high sodium intake, excessive alcohol intake, increase potassium intake, smoking cessation, and increase physical activity of at least 30 minutes of cardio activity daily. She will continue to follow Heart Healthy or DASH diet. - NIFEdipine (PROCARDIA XL/NIFEDICAL-XL) 90 MG 24 hr tablet; Take 1 tablet (90 mg total) by mouth daily.  Dispense: 30 tablet; Refill: 3  4. Hemoglobin A1C between 7% and 9% indicating borderline diabetic control Hgb A1c is stable at 7.6 today. Monitor.   5. Encounter for diabetic foot exam ([place]) Positive. Foot exam toerated well. Multiple areas of decreased sensitivity noted upon foot exam. Decreased sensitivity in both feet. Only inner aspect of foot is responsive to stimulation. Patient counseled on proper foot hygiene. She is encouraged to exam feet often (daily), using mirror if necessary; keep feet clean and dry (especially between toes), keep feet moistened, wear cotton socks, and avoid wearing open-toed shoes, high-heel shoes, and sandals. Patient verbalized understanding.   6. Breast screening declined  7. Edema, peripheral 1+ Bilateral lower extremity edema.   8. SOB (shortness of breath) On exertion. No signs or symptoms of respiratory distress noted.  9. Follow up She will follow up in 1 month.   Meds ordered this encounter  Medications  . NIFEdipine (PROCARDIA  XL/NIFEDICAL-XL) 90 MG 24 hr tablet    Sig: Take 1 tablet (90 mg total) by mouth daily.    Dispense:  30 tablet    Refill:  3  . cloNIDine (CATAPRES) tablet 0.3 mg    Orders Placed This Encounter  Procedures  . POCT glycosylated hemoglobin (Hb A1C)  . POCT urinalysis dipstick  . POCT glucose (manual entry)   Referral Orders  No referral(s) requested today    Kathe Becton,  MSN, FNP-BC [address] [place], [place] [address] [phone] fax   Problem List Items Addressed This Visit      Cardiovascular and Mediastinum   Hypertension   Relevant Medications   NIFEdipine (PROCARDIA XL/NIFEDICAL-XL) 90 MG 24 hr tablet     Endocrine   Hemoglobin A1C between 7% and 9% indicating borderline diabetic control     Other   Edema, peripheral   SOB (shortness of breath)    Other Visit Diagnoses    Accelerated hypertension    -  Primary   Relevant Medications   NIFEdipine (PROCARDIA XL/NIFEDICAL-XL) 90 MG 24 hr tablet   cloNIDine (CATAPRES) tablet 0.3 mg (Completed)   Type 2 diabetes mellitus without complication, without long-term current use of insulin (HCC)       Relevant Orders   POCT glycosylated hemoglobin (Hb A1C) (Completed)   POCT urinalysis dipstick (Completed)   POCT glucose (manual entry) (Completed)   Encounter for diabetic foot exam ([place])  Breast screening declined       Follow up          Meds ordered this encounter  Medications  . NIFEdipine (PROCARDIA XL/NIFEDICAL-XL) 90 MG 24 hr tablet    Sig: Take 1 tablet (90 mg total) by mouth daily.    Dispense:  30 tablet    Refill:  3  . cloNIDine (CATAPRES) tablet 0.3 mg    Follow-up: No follow-ups on file.    Azzie Glatter, FNP

## 2019-10-12 ENCOUNTER — Encounter: Payer: Self-pay | Admitting: Family Medicine

## 2019-10-12 ENCOUNTER — Other Ambulatory Visit: Payer: Self-pay

## 2019-10-12 ENCOUNTER — Ambulatory Visit (INDEPENDENT_AMBULATORY_CARE_PROVIDER_SITE_OTHER): Payer: Self-pay | Admitting: Family Medicine

## 2019-10-12 VITALS — BP 142/84 | HR 92 | Temp 98.8°F | Ht 65.0 in | Wt 300.4 lb

## 2019-10-12 DIAGNOSIS — I10 Essential (primary) hypertension: Secondary | ICD-10-CM

## 2019-10-12 DIAGNOSIS — G8929 Other chronic pain: Secondary | ICD-10-CM

## 2019-10-12 DIAGNOSIS — R739 Hyperglycemia, unspecified: Secondary | ICD-10-CM

## 2019-10-12 DIAGNOSIS — R7303 Prediabetes: Secondary | ICD-10-CM

## 2019-10-12 DIAGNOSIS — M5442 Lumbago with sciatica, left side: Secondary | ICD-10-CM

## 2019-10-12 DIAGNOSIS — R609 Edema, unspecified: Secondary | ICD-10-CM

## 2019-10-12 DIAGNOSIS — E119 Type 2 diabetes mellitus without complications: Secondary | ICD-10-CM

## 2019-10-12 DIAGNOSIS — Z09 Encounter for follow-up examination after completed treatment for conditions other than malignant neoplasm: Secondary | ICD-10-CM

## 2019-10-12 DIAGNOSIS — M5441 Lumbago with sciatica, right side: Secondary | ICD-10-CM

## 2019-10-12 DIAGNOSIS — R600 Localized edema: Secondary | ICD-10-CM

## 2019-10-12 LAB — POCT URINALYSIS DIPSTICK
Bilirubin, UA: NEGATIVE
Blood, UA: NEGATIVE
Glucose, UA: NEGATIVE
Ketones, UA: NEGATIVE
Leukocytes, UA: NEGATIVE
Nitrite, UA: NEGATIVE
Protein, UA: POSITIVE — AB
Spec Grav, UA: 1.025 (ref 1.010–1.025)
Urobilinogen, UA: 0.2 E.U./dL
pH, UA: 6.5 (ref 5.0–8.0)

## 2019-10-12 LAB — GLUCOSE, POCT (MANUAL RESULT ENTRY): POC Glucose: 191 mg/dl — AB (ref 70–99)

## 2019-10-12 MED ORDER — CYCLOBENZAPRINE HCL 10 MG PO TABS: ORAL_TABLET | ORAL | 2 refills | Status: DC

## 2019-10-12 MED ORDER — GABAPENTIN 100 MG PO CAPS: 100.0000 mg | ORAL_CAPSULE | Freq: Three times a day (TID) | ORAL | 2 refills | Status: DC

## 2019-10-12 NOTE — Progress Notes (Signed)
Patient [facility]   Established Patient Office Visit  Subjective:  Patient ID: Nancy Pham, female    DOB: 1965/10/09  Age: 54 y.o. MRN: [id number]  CC:  Chief Complaint  Patient presents with  . Follow-up    HTN, DM    HPI Shilah Plagens is a year old female who presents for Follow Up today.   Past Medical History:  Diagnosis Date  . Asthma   . Cancer ([place])   . Diabetes mellitus   . Gout   . Hyperlipidemia   . Hypertension     Past Surgical History:  Procedure Laterality Date  . ABDOMINAL HYSTERECTOMY     Current Status: Since her last office visit, she is doing well with no complaints. She denies visual changes, chest pain, cough, shortness of breath, heart palpitations, and falls. She has occasional headaches and dizziness with position changes. Denies severe headaches, confusion, seizures, double vision, and blurred vision, nausea and vomiting. She has not been monitoring her blood glucose levels lately. She denies fatigue, frequent urination, blurred vision, excessive hunger, excessive thirst, weight gain, weight loss, and poor wound healing. She continues to check her feet regularly. She denies fevers, chills, recent infections, weight loss, and night sweats. No reports of GI problems such as diarrhea, and constipation. She has no reports of blood in stools, dysuria and hematuria. No depression or anxiety reported today. He denies suicidal ideations, homicidal ideations, or auditory hallucinations. She has c/o increasing back pain today, which she takes OTC pain medications.   Family History  Problem Relation Age of Onset  . Seizures Mother   . Heart attack Father   . Heart attack Sister   . Hypertension Sister   . Hypertension Brother   . Hypertension Brother   . Hypertension Brother   . Hypertension Brother   . Hypertension Sister     Social History   Socioeconomic History  . Marital status: Single    Spouse name:  Not on file  . Number of children: Not on file  . Years of education: Not on file  . Highest education level: Not on file  Occupational History  . Not on file  Tobacco Use  . Smoking status: Never Smoker  . Smokeless tobacco: Never Used  Substance and Sexual Activity  . Alcohol use: No  . Drug use: No  . Sexual activity: Not Currently  Other Topics Concern  . Not on file  Social History Narrative  . Not on file   Social Determinants of Health   Financial Resource Strain:   . Difficulty of Paying Living Expenses:   Food Insecurity:   . Worried About Charity fundraiser in the Last Year:   . Arboriculturist in the Last Year:   Transportation Needs:   . Film/video editor (Medical):   Marland Kitchen Lack of Transportation (Non-Medical):   Physical Activity:   . Days of Exercise per Week:   . Minutes of Exercise per Session:   Stress:   . Feeling of Stress :   Social Connections:   . Frequency of Communication with Friends and Family:   . Frequency of Social Gatherings with Friends and Family:   . Attends Religious Services:   . Active Member of Clubs or Organizations:   . Attends Archivist Meetings:   Marland Kitchen Marital Status:   Intimate Partner Violence:   . Fear of Current or Ex-Partner:   . Emotionally Abused:   .  Physically Abused:   . Sexually Abused:     Outpatient Medications Prior to Visit  Medication Sig Dispense Refill  . acetaminophen (TYLENOL) 325 MG tablet Take 325-650 mg by mouth every 6 (six) hours as needed for mild pain, fever or headache.     . albuterol (VENTOLIN HFA) 108 (90 Base) MCG/ACT inhaler Inhale 1-2 puffs into the lungs every 6 (six) hours as needed for wheezing or shortness of breath. Please dispense 3 months supple. 8 g 12  . allopurinol (ZYLOPRIM) 300 MG tablet Take 1 tablet (300 mg total) by mouth daily. 90 tablet 1  . aspirin EC 81 MG tablet Take 1 tablet (81 mg total) by mouth daily. 90 tablet 1  . Cholecalciferol (VITAMIN D3) 125 MCG  (5000 UT) CAPS Take 1 capsule (5,000 Units total) by mouth daily. 15 capsule 1  . cyclobenzaprine (FLEXERIL) 10 MG tablet TAKE ONE TABLET BY MOUTH THREE TIMES DAILY AS NEEDED FOR MUSCLE SPASMS. 30 tablet 6  . EPINEPHrine 0.3 mg/0.3 mL IJ SOAJ injection Inject 0.3 mLs (0.3 mg total) into the muscle as needed (acute resp failure/worsening anaphylaxis). 3 each 3  . famotidine (PEPCID) 20 MG tablet Take 1 tablet (20 mg total) by mouth 2 (two) times daily. 180 tablet 1  . ferrous sulfate 325 (65 FE) MG tablet Take 1 tablet (325 mg total) by mouth daily with breakfast. 90 tablet 1  . fluticasone (FLONASE) 50 MCG/ACT nasal spray Place 2 sprays into both nostrils daily as needed for allergies or rhinitis. 11.1 mL 6  . furosemide (LASIX) 40 MG tablet Take 1 tablet (40 mg total) by mouth 2 (two) times daily as needed for edema. 180 tablet 1  . glimepiride (AMARYL) 2 MG tablet Take 1 tablet (2 mg total) by mouth daily with breakfast. 90 tablet 1  . hydrocortisone cream (HYDROCORTISONE ANTI-ITCH) 1 % Apply 1 application topically 2 (two) times daily. 30 g 3  . meloxicam (MOBIC) 15 MG tablet TAKE ONE TABLET BY MOUTH EVERY DAY AS NEEDED FOR PAIN 90 tablet 1  . montelukast (SINGULAIR) 10 MG tablet Take 1 tablet (10 mg total) by mouth at bedtime. 90 tablet 1  . Multiple Vitamins-Minerals (CENTRUM SILVER 50+WOMEN) TABS Take 1 tablet by mouth daily. 90 tablet 1  . NIFEdipine (PROCARDIA XL/NIFEDICAL-XL) 90 MG 24 hr tablet Take 1 tablet (90 mg total) by mouth daily. 30 tablet 3  . potassium chloride SA (KLOR-CON) 20 MEQ tablet Take 1 tablet (20 mEq total) by mouth daily. 90 tablet 1  . Probiotic Product (PROBIOTIC DAILY PO) Take 1 capsule by mouth daily.    . rosuvastatin (CRESTOR) 10 MG tablet Take 1 tablet (10 mg total) by mouth daily. 90 tablet 1  . triamcinolone cream (KENALOG) 0.1 % Apply 1 application topically 2 (two) times daily. 30 g 6   No facility-administered medications prior to visit.    Allergies    Allergen Reactions  . Ace Inhibitors Swelling    Swelling of the lips  . Tape Rash    No Band-Aids!!    ROS Review of Systems  Constitutional: Negative.   HENT: Negative.   Eyes: Negative.   Respiratory: Negative.   Cardiovascular: Negative.   Gastrointestinal: Positive for abdominal distention.  Endocrine: Negative.   Genitourinary: Negative.   Musculoskeletal: Positive for back pain (chronic).  Skin: Negative.   Allergic/Immunologic: Negative.   Neurological: Positive for dizziness (occasional ), numbness (pain/numbness/tingling in back and legs) and headaches (occasional ).  Hematological: Negative.   Psychiatric/Behavioral:  Negative.    Objective:    Physical Exam  Constitutional: She is oriented to person, place, and time. She appears well-developed and well-nourished.  HENT:  Head: Atraumatic.  Eyes: Conjunctivae are normal.  Cardiovascular: Normal rate, regular rhythm, normal heart sounds and intact distal pulses.  Pulmonary/Chest: Effort normal and breath sounds normal.  Abdominal: Soft. Bowel sounds are normal.  Musculoskeletal:        General: Edema (bileteral lower extremity edema) present.     Cervical back: Normal range of motion and neck supple.     Comments: Limited ROM in spine  Neurological: She is alert and oriented to person, place, and time. She has normal reflexes.  Skin: Skin is warm and dry.  Psychiatric: She has a normal mood and affect. Her behavior is normal. Judgment and thought content normal.  Nursing note and vitals reviewed.   BP (!) 152/78   Pulse 92   Temp 98.8 F (37.1 C)   Ht 5\' 5"  (1.651 m)   Wt (!) 300 lb 6.4 oz (136.3 kg)   SpO2 99%   BMI 49.99 kg/m  Wt Readings from Last 3 Encounters:  10/12/19 (!) 300 lb 6.4 oz (136.3 kg)  09/14/19 (!) 300 lb 3.2 oz (136.2 kg)  08/15/19 298 lb 3.2 oz (135.3 kg)     Health Maintenance Due  Topic Date Due  . OPHTHALMOLOGY EXAM  Never done  . PAP SMEAR-Modifier  Never done  .  COLONOSCOPY  Never done  . MAMMOGRAM  10/23/2017  . FOOT EXAM  12/03/2018  . URINE MICROALBUMIN  12/03/2018    There are no preventive care reminders to display for this patient.  Lab Results  Component Value Date   TSH 1.710 08/15/2019   Lab Results  Component Value Date   WBC 6.0 08/15/2019   HGB 13.2 08/15/2019   HCT 39.1 08/15/2019   MCV 86 08/15/2019   PLT 340 08/15/2019   Lab Results  Component Value Date   NA 140 08/15/2019   K 3.7 08/15/2019   CO2 27 08/15/2019   GLUCOSE 105 (H) 08/15/2019   BUN 10 08/15/2019   CREATININE 0.74 08/15/2019   BILITOT 0.4 08/15/2019   ALKPHOS 112 08/15/2019   AST 21 08/15/2019   ALT 19 08/15/2019   PROT 7.4 08/15/2019   ALBUMIN 4.3 08/15/2019   CALCIUM 10.2 08/15/2019   ANIONGAP 14 08/21/2017   Lab Results  Component Value Date   CHOL 269 (H) 08/15/2019   Lab Results  Component Value Date   HDL 48 08/15/2019   Lab Results  Component Value Date   LDLCALC 181 (H) 08/15/2019   Lab Results  Component Value Date   TRIG 213 (H) 08/15/2019   Lab Results  Component Value Date   CHOLHDL 5.6 (H) 08/15/2019   Lab Results  Component Value Date   HGBA1C 7.6 (A) 09/14/2019      Assessment & Plan:   1. Chronic bilateral low back pain with bilateral sciatica We will initiate Gabapentin today.  - gabapentin (NEURONTIN) 100 MG capsule; Take 1 capsule (100 mg total) by mouth 3 (three) times daily.  Dispense: 270 capsule; Refill: 2  2. Type 2 diabetes mellitus without complication, without long-term current use of insulin (HCC) She will continue medication as prescribed, to decrease foods/beverages high in sugars and carbs and follow Heart Healthy or DASH diet. Increase physical activity to at least 30 minutes cardio exercise daily.  - POCT glucose (manual entry) - POCT urinalysis dipstick  3. Hemoglobin A1C between 7% and 9% indicating borderline diabetic control Hgb A1c is stable at 7.6 today. We will continue to monitor.     4. Accelerated hypertension The current medical regimen is effective; blood pressure is stable at 142/84 today; continue present plan and medications as prescribed. She will continue to take medications as prescribed, to decrease high sodium intake, excessive alcohol intake, increase potassium intake, smoking cessation, and increase physical activity of at least 30 minutes of cardio activity daily. She will continue to follow Heart Healthy or DASH diet.  5. Edema, peripheral Stable. 1+ not worsening.   6. Essential hypertension The current medical regimen is effective; blood pressure is stable at 142/84 today; continue present plan and medications as prescribed. She will continue to take medications as prescribed, to decrease high sodium intake, excessive alcohol intake, increase potassium intake, smoking cessation, and increase physical activity of at least 30 minutes of cardio activity daily. She will continue to follow Heart Healthy or DASH diet.  7. Hyperglycemia  8. Follow up She will follow up in 6 months.   Meds ordered this encounter  Medications  . gabapentin (NEURONTIN) 100 MG capsule    Sig: Take 1 capsule (100 mg total) by mouth 3 (three) times daily.    Dispense:  270 capsule    Refill:  2    Orders Placed This Encounter  Procedures  . POCT glucose (manual entry)  . POCT urinalysis dipstick    Referral Orders  No referral(s) requested today    Kathe Becton,  MSN, FNP-BC [place] [address] [place], [place] [address] [phone] fax  Problem List Items Addressed This Visit      Cardiovascular and Mediastinum   Hypertension     Endocrine   Hemoglobin A1C between 7% and 9% indicating borderline diabetic control     Other   Edema, peripheral    Other Visit Diagnoses    Chronic bilateral low back pain with bilateral sciatica    -  Primary   Relevant Medications    gabapentin (NEURONTIN) 100 MG capsule   Type 2 diabetes mellitus without complication, without long-term current use of insulin (HCC)       Relevant Orders   POCT glucose (manual entry) (Completed)   POCT urinalysis dipstick (Completed)   Accelerated hypertension       Hyperglycemia       Follow up          Meds ordered this encounter  Medications  . gabapentin (NEURONTIN) 100 MG capsule    Sig: Take 1 capsule (100 mg total) by mouth 3 (three) times daily.    Dispense:  270 capsule    Refill:  2    Follow-up: Return in about 6 months (around 04/13/2020).    Azzie Glatter, FNP

## 2019-10-12 NOTE — Patient Instructions (Signed)
Gabapentin capsules or tablets What is this medicine? GABAPENTIN (GA ba pen tin) is used to control seizures in certain types of epilepsy. It is also used to treat certain types of nerve pain. This medicine may be used for other purposes; ask your health care provider or pharmacist if you have questions. COMMON BRAND NAME(S): Active-PAC with Gabapentin, Gabarone, Neurontin What should I tell my health care provider before I take this medicine? They need to know if you have any of these conditions:  history of drug abuse or alcohol abuse problem  kidney disease  lung or breathing disease  suicidal thoughts, plans, or attempt; a previous suicide attempt by you or a family member  an unusual or allergic reaction to gabapentin, other medicines, foods, dyes, or preservatives  pregnant or trying to get pregnant  breast-feeding How should I use this medicine? Take this medicine by mouth with a glass of water. Follow the directions on the prescription label. You can take it with or without food. If it upsets your stomach, take it with food. Take your medicine at regular intervals. Do not take it more often than directed. Do not stop taking except on your doctor's advice. If you are directed to break the 600 or 800 mg tablets in half as part of your dose, the extra half tablet should be used for the next dose. If you have not used the extra half tablet within 28 days, it should be thrown away. A special MedGuide will be given to you by the pharmacist with each prescription and refill. Be sure to read this information carefully each time. Talk to your pediatrician regarding the use of this medicine in children. While this drug may be prescribed for children as young as 3 years for selected conditions, precautions do apply. Overdosage: If you think you have taken too much of this medicine contact a poison control center or emergency room at once. NOTE: This medicine is only for you. Do not share this  medicine with others. What if I miss a dose? If you miss a dose, take it as soon as you can. If it is almost time for your next dose, take only that dose. Do not take double or extra doses. What may interact with this medicine? This medicine may interact with the following medications:  alcohol  antihistamines for allergy, cough, and cold  certain medicines for anxiety or sleep  certain medicines for depression like amitriptyline, fluoxetine, sertraline  certain medicines for seizures like phenobarbital, primidone  certain medicines for stomach problems  general anesthetics like halothane, isoflurane, methoxyflurane, propofol  local anesthetics like lidocaine, pramoxine, tetracaine  medicines that relax muscles for surgery  narcotic medicines for pain  phenothiazines like chlorpromazine, mesoridazine, prochlorperazine, thioridazine This list may not describe all possible interactions. Give your health care provider a list of all the medicines, herbs, non-prescription drugs, or dietary supplements you use. Also tell them if you smoke, drink alcohol, or use illegal drugs. Some items may interact with your medicine. What should I watch for while using this medicine? Visit your doctor or health care provider for regular checks on your progress. You may want to keep a record at home of how you feel your condition is responding to treatment. You may want to share this information with your doctor or health care provider at each visit. You should contact your doctor or health care provider if your seizures get worse or if you have any new types of seizures. Do not stop taking   this medicine or any of your seizure medicines unless instructed by your doctor or health care provider. Stopping your medicine suddenly can increase your seizures or their severity. This medicine may cause serious skin reactions. They can happen weeks to months after starting the medicine. Contact your health care  provider right away if you notice fevers or flu-like symptoms with a rash. The rash may be red or purple and then turn into blisters or peeling of the skin. Or, you might notice a red rash with swelling of the face, lips or lymph nodes in your neck or under your arms. Wear a medical identification bracelet or chain if you are taking this medicine for seizures, and carry a card that lists all your medications. You may get drowsy, dizzy, or have blurred vision. Do not drive, use machinery, or do anything that needs mental alertness until you know how this medicine affects you. To reduce dizzy or fainting spells, do not sit or stand up quickly, especially if you are an older patient. Alcohol can increase drowsiness and dizziness. Avoid alcoholic drinks. Your mouth may get dry. Chewing sugarless gum or sucking hard candy, and drinking plenty of water will help. The use of this medicine may increase the chance of suicidal thoughts or actions. Pay special attention to how you are responding while on this medicine. Any worsening of mood, or thoughts of suicide or dying should be reported to your health care provider right away. Women who become pregnant while using this medicine may enroll in the [place] Pregnancy Registry by calling [phone]. This registry collects information about the safety of antiepileptic drug use during pregnancy. What side effects may I notice from receiving this medicine? Side effects that you should report to your doctor or health care professional as soon as possible:  allergic reactions like skin rash, itching or hives, swelling of the face, lips, or tongue  breathing problems  rash, fever, and swollen lymph nodes  redness, blistering, peeling or loosening of the skin, including inside the mouth  suicidal thoughts, mood changes Side effects that usually do not require medical attention (report to your doctor or health care professional if they  continue or are bothersome):  dizziness  drowsiness  headache  nausea, vomiting  swelling of ankles, feet, hands  tiredness This list may not describe all possible side effects. Call your doctor for medical advice about side effects. You may report side effects to FDA at 1-800-FDA-1088. Where should I keep my medicine? Keep out of reach of children. This medicine may cause accidental overdose and death if it taken by other adults, children, or pets. Mix any unused medicine with a substance like cat litter or coffee grounds. Then throw the medicine away in a sealed container like a sealed bag or a coffee can with a lid. Do not use the medicine after the expiration date. Store at room temperature between 15 and 30 degrees C (59 and 86 degrees F). NOTE: This sheet is a summary. It may not cover all possible information. If you have questions about this medicine, talk to your doctor, pharmacist, or health care provider.  2020 Elsevier/Gold Standard (2018-10-13 14:16:43) Chronic Back Pain When back pain lasts longer than 3 months, it is called chronic back pain. Pain may get worse at certain times (flare-ups). There are things you can do at home to manage your pain. Follow these instructions at home: Activity      Avoid bending and other activities that make pain  worse.  When standing: ? Keep your upper back and neck straight. ? Keep your shoulders pulled back. ? Avoid slouching.  When sitting: ? Keep your back straight. ? Relax your shoulders. Do not round your shoulders or pull them backward.  Do not sit or stand in one place for long periods of time.  Take short rest breaks during the day. Lying down or standing is usually better than sitting. Resting can help relieve pain.  When sitting or lying down for a long time, do some mild activity or stretching. This will help to prevent stiffness and pain.  Get regular exercise. Ask your doctor what activities are safe for  you.  Do not lift anything that is heavier than 10 lb (4.5 kg). To prevent injury when you lift things: ? Bend your knees. ? Keep the weight close to your body. ? Avoid twisting. Managing pain  If told, put ice on the painful area. Your doctor may tell you to use ice for 24-48 hours after a flare-up starts. ? Put ice in a plastic bag. ? Place a towel between your skin and the bag. ? Leave the ice on for 20 minutes, 2-3 times a day.  If told, put heat on the painful area as often as told by your doctor. Use the heat source that your doctor recommends, such as a moist heat pack or a heating pad. ? Place a towel between your skin and the heat source. ? Leave the heat on for 20-30 minutes. ? Remove the heat if your skin turns bright red. This is especially important if you are unable to feel pain, heat, or cold. You may have a greater risk of getting burned.  Soak in a warm bath. This can help relieve pain.  Take over-the-counter and prescription medicines only as told by your doctor. General instructions  Sleep on a firm mattress. Try lying on your side with your knees slightly bent. If you lie on your back, put a pillow under your knees.  Keep all follow-up visits as told by your doctor. This is important. Contact a doctor if:  You have pain that does not get better with rest or medicine. Get help right away if:  One or both of your arms or legs feel weak.  One or both of your arms or legs lose feeling (numbness).  You have trouble controlling when you poop (bowel movement) or pee (urinate).  You feel sick to your stomach (nauseous).  You throw up (vomit).  You have belly (abdominal) pain.  You have shortness of breath.  You pass out (faint). Summary  When back pain lasts longer than 3 months, it is called chronic back pain.  Pain may get worse at certain times (flare-ups).  Use ice and heat as told by your doctor. Your doctor may tell you to use ice after  flare-ups. This information is not intended to replace advice given to you by your health care provider. Make sure you discuss any questions you have with your health care provider. Document Revised: 11/02/2018 Document Reviewed: 02/24/2017 Elsevier Patient Education  2020 [employer].

## 2019-11-09 ENCOUNTER — Telehealth: Payer: Self-pay | Admitting: Family Medicine

## 2019-11-12 ENCOUNTER — Other Ambulatory Visit: Payer: Self-pay

## 2019-11-12 DIAGNOSIS — I10 Essential (primary) hypertension: Secondary | ICD-10-CM

## 2019-11-12 MED ORDER — NIFEDIPINE ER OSMOTIC RELEASE 90 MG PO TB24: 90.0000 mg | ORAL_TABLET | Freq: Every day | ORAL | 1 refills | Status: DC

## 2019-11-12 NOTE — Telephone Encounter (Signed)
Patient only request the BP med. Rx refilled.

## 2020-04-14 ENCOUNTER — Ambulatory Visit (HOSPITAL_COMMUNITY)
Admission: RE | Admit: 2020-04-14 | Discharge: 2020-04-14 | Disposition: A | Discharge status: Home / Self Care | Payer: Self-pay | Source: Ambulatory Visit | Attending: Family Medicine | Admitting: Family Medicine

## 2020-04-14 ENCOUNTER — Encounter: Payer: Self-pay | Admitting: Family Medicine

## 2020-04-14 ENCOUNTER — Ambulatory Visit (INDEPENDENT_AMBULATORY_CARE_PROVIDER_SITE_OTHER): Payer: Self-pay | Admitting: Family Medicine

## 2020-04-14 ENCOUNTER — Other Ambulatory Visit: Payer: Self-pay

## 2020-04-14 VITALS — BP 160/102 | HR 77 | Temp 98.1°F | Ht 65.0 in | Wt 301.2 lb

## 2020-04-14 DIAGNOSIS — Z09 Encounter for follow-up examination after completed treatment for conditions other than malignant neoplasm: Secondary | ICD-10-CM

## 2020-04-14 DIAGNOSIS — M5441 Lumbago with sciatica, right side: Secondary | ICD-10-CM

## 2020-04-14 DIAGNOSIS — S99921A Unspecified injury of right foot, initial encounter: Secondary | ICD-10-CM

## 2020-04-14 DIAGNOSIS — E119 Type 2 diabetes mellitus without complications: Secondary | ICD-10-CM

## 2020-04-14 DIAGNOSIS — M5442 Lumbago with sciatica, left side: Secondary | ICD-10-CM

## 2020-04-14 DIAGNOSIS — I10 Essential (primary) hypertension: Secondary | ICD-10-CM

## 2020-04-14 DIAGNOSIS — G8929 Other chronic pain: Secondary | ICD-10-CM

## 2020-04-14 DIAGNOSIS — M79674 Pain in right toe(s): Secondary | ICD-10-CM | POA: Insufficient documentation

## 2020-04-14 DIAGNOSIS — R609 Edema, unspecified: Secondary | ICD-10-CM

## 2020-04-14 DIAGNOSIS — M7989 Other specified soft tissue disorders: Secondary | ICD-10-CM

## 2020-04-14 DIAGNOSIS — R600 Localized edema: Secondary | ICD-10-CM

## 2020-04-14 LAB — POCT GLYCOSYLATED HEMOGLOBIN (HGB A1C)
HbA1c POC (<> result, manual entry): 8.1 % (ref 4.0–5.6)
HbA1c, POC (controlled diabetic range): 8.1 % — AB (ref 0.0–7.0)
HbA1c, POC (prediabetic range): 8.1 % — AB (ref 5.7–6.4)
Hemoglobin A1C: 8.1 % — AB (ref 4.0–5.6)

## 2020-04-14 LAB — POCT URINALYSIS DIPSTICK
Bilirubin, UA: NEGATIVE
Blood, UA: NEGATIVE
Glucose, UA: NEGATIVE
Ketones, UA: NEGATIVE
Leukocytes, UA: NEGATIVE
Nitrite, UA: NEGATIVE
Protein, UA: POSITIVE — AB
Spec Grav, UA: 1.025 (ref 1.010–1.025)
Urobilinogen, UA: 0.2 E.U./dL
pH, UA: 6.5 (ref 5.0–8.0)

## 2020-04-14 LAB — GLUCOSE, POCT (MANUAL RESULT ENTRY): POC Glucose: 182 mg/dl — AB (ref 70–99)

## 2020-04-14 MED ORDER — MELOXICAM 15 MG PO TABS: ORAL_TABLET | ORAL | 1 refills | Status: DC

## 2020-04-14 MED ORDER — MONTELUKAST SODIUM 10 MG PO TABS: 10.0000 mg | ORAL_TABLET | Freq: Every day | ORAL | 1 refills | Status: DC

## 2020-04-14 MED ORDER — HYDROCORTISONE 1 % EX CREA: 1.0000 "application " | TOPICAL_CREAM | Freq: Two times a day (BID) | CUTANEOUS | 3 refills | Status: DC

## 2020-04-14 MED ORDER — FERROUS SULFATE 325 (65 FE) MG PO TABS: 325.0000 mg | ORAL_TABLET | Freq: Every day | ORAL | 1 refills | Status: DC

## 2020-04-14 MED ORDER — ALLOPURINOL 300 MG PO TABS: 300.0000 mg | ORAL_TABLET | Freq: Every day | ORAL | 1 refills | Status: DC

## 2020-04-14 MED ORDER — CYCLOBENZAPRINE HCL 10 MG PO TABS: ORAL_TABLET | ORAL | 2 refills | Status: DC

## 2020-04-14 MED ORDER — ALBUTEROL SULFATE HFA 108 (90 BASE) MCG/ACT IN AERS: 1.0000 | INHALATION_SPRAY | Freq: Four times a day (QID) | RESPIRATORY_TRACT | 11 refills | Status: DC | PRN

## 2020-04-14 MED ORDER — GABAPENTIN 100 MG PO CAPS: 100.0000 mg | ORAL_CAPSULE | Freq: Three times a day (TID) | ORAL | 1 refills | Status: DC

## 2020-04-14 MED ORDER — FUROSEMIDE 40 MG PO TABS: 40.0000 mg | ORAL_TABLET | Freq: Two times a day (BID) | ORAL | 1 refills | Status: DC | PRN

## 2020-04-14 MED ORDER — ROSUVASTATIN CALCIUM 10 MG PO TABS: 10.0000 mg | ORAL_TABLET | Freq: Every day | ORAL | 1 refills | Status: DC

## 2020-04-14 MED ORDER — NIFEDIPINE ER OSMOTIC RELEASE 90 MG PO TB24: 90.0000 mg | ORAL_TABLET | Freq: Every day | ORAL | 1 refills | Status: DC

## 2020-04-14 MED ORDER — GLIMEPIRIDE 2 MG PO TABS: 2.0000 mg | ORAL_TABLET | Freq: Every day | ORAL | 1 refills | Status: DC

## 2020-04-14 MED ORDER — POTASSIUM CHLORIDE CRYS ER 20 MEQ PO TBCR
20.0000 meq | EXTENDED_RELEASE_TABLET | Freq: Every day | ORAL | 1 refills | Status: DC
Start: 2020-04-14 — End: 2020-09-10

## 2020-04-14 MED ORDER — FLUTICASONE PROPIONATE 50 MCG/ACT NA SUSP: 2.0000 | Freq: Every day | NASAL | 6 refills | Status: DC | PRN

## 2020-04-14 MED ORDER — FAMOTIDINE 20 MG PO TABS: 20.0000 mg | ORAL_TABLET | Freq: Two times a day (BID) | ORAL | 1 refills | Status: DC

## 2020-04-14 NOTE — Progress Notes (Signed)
Patient [facility]   Established Patient Office Visit  Subjective:  Patient ID: Nancy Pham, female    DOB: 22-May-1966  Age: 54 y.o. MRN: [id number]  CC:  Chief Complaint  Patient presents with  . Follow-up    Pt states she hit her R baby toe on the corner or the sofa.X3days.     HPI Nancy Pham is a 54 year old female who presents for Follow Up today.    Patient Active Problem List   Diagnosis Date Noted  . Hemoglobin A1C between 7% and 9% indicating borderline diabetic control 02/14/2019  . 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 Rash 02/14/2019  . Itching 02/14/2019  . Enlargement of lymph node 02/14/2019  . Edema, peripheral 02/14/2019  . Bilateral low back pain without sciatica 02/14/2019  . Chest pain 08/21/2017  . GERD (gastroesophageal reflux disease) 08/21/2017  . Gout 08/21/2017  . Diabetes mellitus without complication ([place]) 38/88/2800  . Hypertension   . Hyperlipidemia   . Asthma   . SOB (shortness of breath) 03/27/2017  . Anaphylaxis 03/20/2017    Current Status: Since her last office visit, she has c/o recent right toe trauma, which she states that she stomped her toe against furniture. Her blood pressures are elevated today, which she states that she has not been taking her medications as prescribed because she has not been able to afford to buy her meds. She denies fevers, chills, fatigue, recent infections, weight loss, and night sweats. She has not had any visual changes, and falls. No chest pain, heart palpitations, cough and shortness of breath reported. Denies GI problems such as diarrhea, and constipation. She has no reports of blood in stools, dysuria and hematuria. No depression or anxiety reported. She is taking all medications as prescribed. She denies pain today.   Past Medical History:  Diagnosis Date  . Asthma   . Cancer ([place])   . Diabetes  mellitus   . Gout   . Hyperlipidemia   . Hypertension     Past Surgical History:  Procedure Laterality Date  . ABDOMINAL HYSTERECTOMY      Family History  Problem Relation Age of Onset  . Seizures Mother   . Heart attack Father   . Heart attack Sister   . Hypertension Sister   . Hypertension Brother   . Hypertension Brother   . Hypertension Brother   . Hypertension Brother   . Hypertension Sister     Social History   Socioeconomic History  . Marital status: Single    Spouse name: Not on file  . Number of children: Not on file  . Years of education: Not on file  . Highest education level: Not on file  Occupational History  . Not on file  Tobacco Use  . Smoking status: Never Smoker  . Smokeless tobacco: Never Used  Vaping Use  . Vaping Use: Never used  Substance and Sexual Activity  . Alcohol use: No  . Drug use: No  . Sexual activity: Not Currently  Other Topics Concern  . Not on file  Social History Narrative  . Not on file   Social Determinants of Health   Financial Resource Strain:   . Difficulty of Paying Living Expenses: Not on file  Food Insecurity:   . Worried About Charity fundraiser in the Last Year: Not on file  . Ran Out of Food in the Last Year: Not on  file  Transportation Needs:   . Film/video editor (Medical): Not on file  . Lack of Transportation (Non-Medical): Not on file  Physical Activity:   . Days of Exercise per Week: Not on file  . Minutes of Exercise per Session: Not on file  Stress:   . Feeling of Stress : Not on file  Social Connections:   . Frequency of Communication with Friends and Family: Not on file  . Frequency of Social Gatherings with Friends and Family: Not on file  . Attends Religious Services: Not on file  . Active Member of Clubs or Organizations: Not on file  . Attends Archivist Meetings: Not on file  . Marital Status: Not on file  Intimate Partner Violence:   . Fear of Current or Ex-Partner:  Not on file  . Emotionally Abused: Not on file  . Physically Abused: Not on file  . Sexually Abused: Not on file    Outpatient Medications Prior to Visit  Medication Sig Dispense Refill  . acetaminophen (TYLENOL) 325 MG tablet Take 325-650 mg by mouth every 6 (six) hours as needed for mild pain, fever or headache.     Marland Kitchen aspirin EC 81 MG tablet Take 1 tablet (81 mg total) by mouth daily. 90 tablet 1  . Cholecalciferol (VITAMIN D3) 125 MCG (5000 UT) CAPS Take 1 capsule (5,000 Units total) by mouth daily. 15 capsule 1  . Multiple Vitamins-Minerals (CENTRUM SILVER 50+WOMEN) TABS Take 1 tablet by mouth daily. 90 tablet 1  . Probiotic Product (PROBIOTIC DAILY PO) Take 1 capsule by mouth daily.    Marland Kitchen albuterol (VENTOLIN HFA) 108 (90 Base) MCG/ACT inhaler Inhale 1-2 puffs into the lungs every 6 (six) hours as needed for wheezing or shortness of breath. Please dispense 3 months supple. 8 g 12  . allopurinol (ZYLOPRIM) 300 MG tablet Take 1 tablet (300 mg total) by mouth daily. 90 tablet 1  . cyclobenzaprine (FLEXERIL) 10 MG tablet TAKE ONE TABLET BY MOUTH THREE TIMES DAILY AS NEEDED FOR MUSCLE SPASMS. 180 tablet 2  . famotidine (PEPCID) 20 MG tablet Take 1 tablet (20 mg total) by mouth 2 (two) times daily. 180 tablet 1  . ferrous sulfate 325 (65 FE) MG tablet Take 1 tablet (325 mg total) by mouth daily with breakfast. 90 tablet 1  . fluticasone (FLONASE) 50 MCG/ACT nasal spray Place 2 sprays into both nostrils daily as needed for allergies or rhinitis. 11.1 mL 6  . furosemide (LASIX) 40 MG tablet Take 1 tablet (40 mg total) by mouth 2 (two) times daily as needed for edema. 180 tablet 1  . gabapentin (NEURONTIN) 100 MG capsule Take 1 capsule (100 mg total) by mouth 3 (three) times daily. 270 capsule 2  . glimepiride (AMARYL) 2 MG tablet Take 1 tablet (2 mg total) by mouth daily with breakfast. 90 tablet 1  . montelukast (SINGULAIR) 10 MG tablet Take 1 tablet (10 mg total) by mouth at bedtime. 90 tablet 1    . NIFEdipine (PROCARDIA XL/NIFEDICAL-XL) 90 MG 24 hr tablet Take 1 tablet (90 mg total) by mouth daily. 90 tablet 1  . rosuvastatin (CRESTOR) 10 MG tablet Take 1 tablet (10 mg total) by mouth daily. 90 tablet 1  . EPINEPHrine 0.3 mg/0.3 mL IJ SOAJ injection Inject 0.3 mLs (0.3 mg total) into the muscle as needed (acute resp failure/worsening anaphylaxis). (Patient not taking: Reported on 04/14/2020) 3 each 3  . triamcinolone cream (KENALOG) 0.1 % Apply 1 application topically 2 (two) times  daily. (Patient not taking: Reported on 04/14/2020) 30 g 6  . hydrocortisone cream (HYDROCORTISONE ANTI-ITCH) 1 % Apply 1 application topically 2 (two) times daily. (Patient not taking: Reported on 04/14/2020) 30 g 3  . meloxicam (MOBIC) 15 MG tablet TAKE ONE TABLET BY MOUTH EVERY DAY AS NEEDED FOR PAIN (Patient not taking: Reported on 04/14/2020) 90 tablet 1  . potassium chloride SA (KLOR-CON) 20 MEQ tablet Take 1 tablet (20 mEq total) by mouth daily. (Patient not taking: Reported on 04/14/2020) 90 tablet 1   No facility-administered medications prior to visit.    Allergies  Allergen Reactions  . Ace Inhibitors Swelling    Swelling of the lips  . Tape Rash    No Band-Aids!!    ROS Review of Systems  Constitutional: Positive for fatigue.  HENT: Negative.   Eyes: Negative.   Respiratory: Negative.   Cardiovascular: Negative.   Gastrointestinal: Positive for abdominal distention (obese).  Endocrine: Negative.   Genitourinary: Negative.   Musculoskeletal: Positive for arthralgias (generalized joint pain).  Skin: Negative.   Allergic/Immunologic: Negative.   Neurological: Positive for dizziness (occasional ) and headaches (occasional ).  Hematological: Negative.   Psychiatric/Behavioral: Negative.       Objective:    Physical Exam Vitals and nursing note reviewed.  Constitutional:      Appearance: Normal appearance. She is obese.  HENT:     Head: Normocephalic and atraumatic.     Nose: Nose  normal.     Mouth/Throat:     Mouth: Mucous membranes are moist.     Pharynx: Oropharynx is clear.  Cardiovascular:     Rate and Rhythm: Normal rate and regular rhythm.     Pulses: Normal pulses.     Heart sounds: Normal heart sounds.  Pulmonary:     Effort: Pulmonary effort is normal.     Breath sounds: Normal breath sounds.  Abdominal:     General: Bowel sounds are normal. There is distension (obese).     Palpations: Abdomen is soft.  Musculoskeletal:        General: Normal range of motion.     Cervical back: Normal range of motion and neck supple.  Skin:    General: Skin is warm and dry.  Neurological:     General: No focal deficit present.     Mental Status: She is alert and oriented to person, place, and time.  Psychiatric:        Mood and Affect: Mood normal.        Behavior: Behavior normal.        Thought Content: Thought content normal.        Judgment: Judgment normal.     BP (!) 160/102 (BP Location: Left Arm, Patient Position: Sitting, Cuff Size: Large)   Pulse 77   Temp 98.1 F (36.7 C)   Ht 5\' 5"  (1.651 m)   Wt (!) 301 lb 3.2 oz (136.6 kg)   SpO2 99%   BMI 50.12 kg/m  Wt Readings from Last 3 Encounters:  04/14/20 (!) 301 lb 3.2 oz (136.6 kg)  10/12/19 (!) 300 lb 6.4 oz (136.3 kg)  09/14/19 (!) 300 lb 3.2 oz (136.2 kg)     Health Maintenance Due  Topic Date Due  . Hepatitis C Screening  Never done  . PNEUMOCOCCAL POLYSACCHARIDE VACCINE AGE 15-64 HIGH RISK  Never done  . OPHTHALMOLOGY EXAM  Never done  . COVID-19 Vaccine (1) Never done  . PAP SMEAR-Modifier  Never done  . COLONOSCOPY  Never done  . MAMMOGRAM  10/23/2017  . FOOT EXAM  12/03/2018  . URINE MICROALBUMIN  12/03/2018  . INFLUENZA VACCINE  Never done    There are no preventive care reminders to display for this patient.  Lab Results  Component Value Date   TSH 1.710 08/15/2019   Lab Results  Component Value Date   WBC 6.0 08/15/2019   HGB 13.2 08/15/2019   HCT 39.1 08/15/2019     MCV 86 08/15/2019   PLT 340 08/15/2019   Lab Results  Component Value Date   NA 140 08/15/2019   K 3.7 08/15/2019   CO2 27 08/15/2019   GLUCOSE 105 (H) 08/15/2019   BUN 10 08/15/2019   CREATININE 0.74 08/15/2019   BILITOT 0.4 08/15/2019   ALKPHOS 112 08/15/2019   AST 21 08/15/2019   ALT 19 08/15/2019   PROT 7.4 08/15/2019   ALBUMIN 4.3 08/15/2019   CALCIUM 10.2 08/15/2019   ANIONGAP 14 08/21/2017   Lab Results  Component Value Date   CHOL 269 (H) 08/15/2019   Lab Results  Component Value Date   HDL 48 08/15/2019   Lab Results  Component Value Date   LDLCALC 181 (H) 08/15/2019   Lab Results  Component Value Date   TRIG 213 (H) 08/15/2019   Lab Results  Component Value Date   CHOLHDL 5.6 (H) 08/15/2019   Lab Results  Component Value Date   HGBA1C 8.1 (A) 04/14/2020   HGBA1C 8.1 04/14/2020   HGBA1C 8.1 (A) 04/14/2020   HGBA1C 8.1 (A) 04/14/2020    Assessment & Plan:   1. Accelerated hypertension We will transfer all refills of medications to another pharmacy today because patient is no longer able to receive refills from [facility], [place], [place] because she is a resident of [place]. she is now wanting medications to be sent to [facility] for affordability. Patient is advised to contact office prior to taking all medications if she experiences financial difficulties in the future.   2. Essential hypertension She will pick up medications from [facility] and begin to take medications as prescribed. She will decrease high sodium intake, excessive alcohol intake, increase potassium intake, smoking cessation, and increase physical activity of at least 30 minutes of cardio activity daily. She will continue to follow Heart Healthy or DASH diet. - NIFEdipine (PROCARDIA XL/NIFEDICAL-XL) 90 MG 24 hr tablet; Take 1 tablet (90 mg total) by mouth daily.  Dispense: 90 tablet; Refill: 1  3. Type 2 diabetes mellitus  without complication, without long-term current use of insulin (HCC) - Urinalysis Dipstick - POC HgB A1c - POC Glucose (CBG)  4. Toe trauma, right, initial encounter - DG Toe 5th Right; Future  5. Pain and swelling of toe, right - DG Toe 5th Right; Future  6. Chronic bilateral low back pain with bilateral sciatica  7. Edema, peripheral  8. Follow up She will follow up in 3 months.   Meds ordered this encounter  Medications  . DISCONTD: albuterol (VENTOLIN HFA) 108 (90 Base) MCG/ACT inhaler    Sig: Inhale 1-2 puffs into the lungs every 6 (six) hours as needed for wheezing or shortness of breath. Please dispense 3 months supple.    Dispense:  8 g    Refill:  11  . DISCONTD: allopurinol (ZYLOPRIM) 300 MG tablet    Sig: Take 1 tablet (300 mg total) by mouth daily.    Dispense:  90 tablet    Refill:  1  .  DISCONTD: cyclobenzaprine (FLEXERIL) 10 MG tablet    Sig: TAKE ONE TABLET BY MOUTH THREE TIMES DAILY AS NEEDED FOR MUSCLE SPASMS.    Dispense:  180 tablet    Refill:  2  . DISCONTD: famotidine (PEPCID) 20 MG tablet    Sig: Take 1 tablet (20 mg total) by mouth 2 (two) times daily.    Dispense:  180 tablet    Refill:  1  . DISCONTD: ferrous sulfate 325 (65 FE) MG tablet    Sig: Take 1 tablet (325 mg total) by mouth daily with breakfast.    Dispense:  90 tablet    Refill:  1  . DISCONTD: fluticasone (FLONASE) 50 MCG/ACT nasal spray    Sig: Place 2 sprays into both nostrils daily as needed for allergies or rhinitis.    Dispense:  11.1 mL    Refill:  6    Please dsipense 3 months supply.  Marland Kitchen DISCONTD: furosemide (LASIX) 40 MG tablet    Sig: Take 1 tablet (40 mg total) by mouth 2 (two) times daily as needed for edema.    Dispense:  180 tablet    Refill:  1  . DISCONTD: gabapentin (NEURONTIN) 100 MG capsule    Sig: Take 1 capsule (100 mg total) by mouth 3 (three) times daily.    Dispense:  270 capsule    Refill:  1  . DISCONTD: glimepiride (AMARYL) 2 MG tablet    Sig: Take 1  tablet (2 mg total) by mouth daily with breakfast.    Dispense:  90 tablet    Refill:  1  . DISCONTD: montelukast (SINGULAIR) 10 MG tablet    Sig: Take 1 tablet (10 mg total) by mouth at bedtime.    Dispense:  90 tablet    Refill:  1  . NIFEdipine (PROCARDIA XL/NIFEDICAL-XL) 90 MG 24 hr tablet    Sig: Take 1 tablet (90 mg total) by mouth daily.    Dispense:  90 tablet    Refill:  1  . DISCONTD: rosuvastatin (CRESTOR) 10 MG tablet    Sig: Take 1 tablet (10 mg total) by mouth daily.    Dispense:  90 tablet    Refill:  1  . DISCONTD: hydrocortisone cream (HYDROCORTISONE ANTI-ITCH) 1 %    Sig: Apply 1 application topically 2 (two) times daily.    Dispense:  30 g    Refill:  3  . DISCONTD: meloxicam (MOBIC) 15 MG tablet    Sig: TAKE ONE TABLET BY MOUTH EVERY DAY AS NEEDED FOR PAIN    Dispense:  90 tablet    Refill:  1  . potassium chloride SA (KLOR-CON) 20 MEQ tablet    Sig: Take 1 tablet (20 mEq total) by mouth daily.    Dispense:  90 tablet    Refill:  1    Orders Placed This Encounter  Procedures  . DG Toe 5th Right  . Urinalysis Dipstick  . POC HgB A1c  . POC Glucose (CBG)    Referral Orders  No referral(s) requested today    Kathe Becton,  MSN, FNP-BC [facility] [address] [place], [place] [address] [phone]  Problem List Items Addressed This Visit      Cardiovascular and Mediastinum   Hypertension   Relevant Medications   NIFEdipine (PROCARDIA XL/NIFEDICAL-XL) 90 MG 24 hr tablet     Other   Edema, peripheral    Other Visit Diagnoses  Accelerated hypertension    -  Primary   Relevant Medications   NIFEdipine (PROCARDIA XL/NIFEDICAL-XL) 90 MG 24 hr tablet   Type 2 diabetes mellitus without complication, without long-term current use of insulin (HCC)       Relevant Orders   Urinalysis Dipstick (Completed)   POC HgB A1c (Completed)   POC  Glucose (CBG) (Completed)   Toe trauma, right, initial encounter       Relevant Orders   DG Toe 5th Right (Completed)   Pain and swelling of toe, right       Relevant Orders   DG Toe 5th Right (Completed)   Chronic bilateral low back pain with bilateral sciatica       Follow up          Meds ordered this encounter  Medications  . DISCONTD: albuterol (VENTOLIN HFA) 108 (90 Base) MCG/ACT inhaler    Sig: Inhale 1-2 puffs into the lungs every 6 (six) hours as needed for wheezing or shortness of breath. Please dispense 3 months supple.    Dispense:  8 g    Refill:  11  . DISCONTD: allopurinol (ZYLOPRIM) 300 MG tablet    Sig: Take 1 tablet (300 mg total) by mouth daily.    Dispense:  90 tablet    Refill:  1  . DISCONTD: cyclobenzaprine (FLEXERIL) 10 MG tablet    Sig: TAKE ONE TABLET BY MOUTH THREE TIMES DAILY AS NEEDED FOR MUSCLE SPASMS.    Dispense:  180 tablet    Refill:  2  . DISCONTD: famotidine (PEPCID) 20 MG tablet    Sig: Take 1 tablet (20 mg total) by mouth 2 (two) times daily.    Dispense:  180 tablet    Refill:  1  . DISCONTD: ferrous sulfate 325 (65 FE) MG tablet    Sig: Take 1 tablet (325 mg total) by mouth daily with breakfast.    Dispense:  90 tablet    Refill:  1  . DISCONTD: fluticasone (FLONASE) 50 MCG/ACT nasal spray    Sig: Place 2 sprays into both nostrils daily as needed for allergies or rhinitis.    Dispense:  11.1 mL    Refill:  6    Please dsipense 3 months supply.  Marland Kitchen DISCONTD: furosemide (LASIX) 40 MG tablet    Sig: Take 1 tablet (40 mg total) by mouth 2 (two) times daily as needed for edema.    Dispense:  180 tablet    Refill:  1  . DISCONTD: gabapentin (NEURONTIN) 100 MG capsule    Sig: Take 1 capsule (100 mg total) by mouth 3 (three) times daily.    Dispense:  270 capsule    Refill:  1  . DISCONTD: glimepiride (AMARYL) 2 MG tablet    Sig: Take 1 tablet (2 mg total) by mouth daily with breakfast.    Dispense:  90 tablet    Refill:  1  .  DISCONTD: montelukast (SINGULAIR) 10 MG tablet    Sig: Take 1 tablet (10 mg total) by mouth at bedtime.    Dispense:  90 tablet    Refill:  1  . NIFEdipine (PROCARDIA XL/NIFEDICAL-XL) 90 MG 24 hr tablet    Sig: Take 1 tablet (90 mg total) by mouth daily.    Dispense:  90 tablet    Refill:  1  . DISCONTD: rosuvastatin (CRESTOR) 10 MG tablet    Sig: Take 1 tablet (10 mg total) by mouth daily.    Dispense:  90 tablet  Refill:  1  . DISCONTD: hydrocortisone cream (HYDROCORTISONE ANTI-ITCH) 1 %    Sig: Apply 1 application topically 2 (two) times daily.    Dispense:  30 g    Refill:  3  . DISCONTD: meloxicam (MOBIC) 15 MG tablet    Sig: TAKE ONE TABLET BY MOUTH EVERY DAY AS NEEDED FOR PAIN    Dispense:  90 tablet    Refill:  1  . potassium chloride SA (KLOR-CON) 20 MEQ tablet    Sig: Take 1 tablet (20 mEq total) by mouth daily.    Dispense:  90 tablet    Refill:  1    Follow-up: Return in about 1 month (around 05/14/2020).    Azzie Glatter, FNP

## 2020-04-16 ENCOUNTER — Encounter: Payer: Self-pay | Admitting: Family Medicine

## 2020-04-16 ENCOUNTER — Other Ambulatory Visit: Payer: Self-pay | Admitting: Family Medicine

## 2020-04-16 DIAGNOSIS — L299 Pruritus, unspecified: Secondary | ICD-10-CM

## 2020-04-16 DIAGNOSIS — M545 Low back pain, unspecified: Secondary | ICD-10-CM

## 2020-04-16 DIAGNOSIS — E781 Pure hyperglyceridemia: Secondary | ICD-10-CM

## 2020-04-16 DIAGNOSIS — R600 Localized edema: Secondary | ICD-10-CM

## 2020-04-16 DIAGNOSIS — E119 Type 2 diabetes mellitus without complications: Secondary | ICD-10-CM

## 2020-04-16 DIAGNOSIS — R0602 Shortness of breath: Secondary | ICD-10-CM

## 2020-04-16 DIAGNOSIS — R21 Rash and other nonspecific skin eruption: Secondary | ICD-10-CM

## 2020-04-16 DIAGNOSIS — R609 Edema, unspecified: Secondary | ICD-10-CM

## 2020-04-16 DIAGNOSIS — I10 Essential (primary) hypertension: Secondary | ICD-10-CM

## 2020-04-16 DIAGNOSIS — M5442 Lumbago with sciatica, left side: Secondary | ICD-10-CM

## 2020-04-16 DIAGNOSIS — G8929 Other chronic pain: Secondary | ICD-10-CM

## 2020-04-16 MED ORDER — ROSUVASTATIN CALCIUM 10 MG PO TABS: 10.0000 mg | ORAL_TABLET | Freq: Every day | ORAL | 3 refills | Status: DC

## 2020-04-16 MED ORDER — MELOXICAM 15 MG PO TABS: ORAL_TABLET | ORAL | 2 refills | Status: DC

## 2020-04-16 MED ORDER — FERROUS SULFATE 325 (65 FE) MG PO TABS: 325.0000 mg | ORAL_TABLET | Freq: Every day | ORAL | 3 refills | Status: AC

## 2020-04-16 MED ORDER — ALBUTEROL SULFATE HFA 108 (90 BASE) MCG/ACT IN AERS: 1.0000 | INHALATION_SPRAY | Freq: Four times a day (QID) | RESPIRATORY_TRACT | 11 refills | Status: AC | PRN

## 2020-04-16 MED ORDER — GABAPENTIN 100 MG PO CAPS: 100.0000 mg | ORAL_CAPSULE | Freq: Three times a day (TID) | ORAL | 3 refills | Status: DC

## 2020-04-16 MED ORDER — FLUTICASONE PROPIONATE 50 MCG/ACT NA SUSP: 2.0000 | Freq: Every day | NASAL | 11 refills | Status: AC | PRN

## 2020-04-16 MED ORDER — ATENOLOL 100 MG PO TABS: 100.0000 mg | ORAL_TABLET | Freq: Every day | ORAL | 3 refills | Status: DC

## 2020-04-16 MED ORDER — CYCLOBENZAPRINE HCL 10 MG PO TABS: ORAL_TABLET | ORAL | 2 refills | Status: DC

## 2020-04-16 MED ORDER — FUROSEMIDE 40 MG PO TABS: 40.0000 mg | ORAL_TABLET | Freq: Two times a day (BID) | ORAL | 2 refills | Status: DC | PRN

## 2020-04-16 MED ORDER — ALLOPURINOL 300 MG PO TABS
300.0000 mg | ORAL_TABLET | Freq: Every day | ORAL | 3 refills | Status: DC
Start: 2020-04-16 — End: 2020-09-10

## 2020-04-16 MED ORDER — FAMOTIDINE 20 MG PO TABS
20.0000 mg | ORAL_TABLET | Freq: Two times a day (BID) | ORAL | 3 refills | Status: DC
Start: 2020-04-16 — End: 2021-03-11

## 2020-04-16 MED ORDER — GLIMEPIRIDE 2 MG PO TABS: 2.0000 mg | ORAL_TABLET | Freq: Every day | ORAL | 3 refills | Status: DC

## 2020-04-16 MED ORDER — MONTELUKAST SODIUM 10 MG PO TABS: 10.0000 mg | ORAL_TABLET | Freq: Every day | ORAL | 3 refills | Status: DC

## 2020-04-16 MED ORDER — HYDROCORTISONE 1 % EX CREA: 1.0000 "application " | TOPICAL_CREAM | Freq: Two times a day (BID) | CUTANEOUS | 3 refills | Status: DC

## 2020-05-14 ENCOUNTER — Other Ambulatory Visit: Payer: Self-pay

## 2020-05-14 ENCOUNTER — Encounter: Payer: Self-pay | Admitting: Family Medicine

## 2020-05-14 ENCOUNTER — Ambulatory Visit (INDEPENDENT_AMBULATORY_CARE_PROVIDER_SITE_OTHER): Payer: Self-pay | Admitting: Family Medicine

## 2020-05-14 VITALS — BP 145/77 | HR 91 | Temp 97.7°F | Resp 18 | Ht 66.0 in | Wt 302.4 lb

## 2020-05-14 DIAGNOSIS — M5441 Lumbago with sciatica, right side: Secondary | ICD-10-CM

## 2020-05-14 DIAGNOSIS — Z09 Encounter for follow-up examination after completed treatment for conditions other than malignant neoplasm: Secondary | ICD-10-CM

## 2020-05-14 DIAGNOSIS — R600 Localized edema: Secondary | ICD-10-CM

## 2020-05-14 DIAGNOSIS — M5442 Lumbago with sciatica, left side: Secondary | ICD-10-CM

## 2020-05-14 DIAGNOSIS — G8929 Other chronic pain: Secondary | ICD-10-CM

## 2020-05-14 DIAGNOSIS — R609 Edema, unspecified: Secondary | ICD-10-CM

## 2020-05-14 DIAGNOSIS — E119 Type 2 diabetes mellitus without complications: Secondary | ICD-10-CM

## 2020-05-14 DIAGNOSIS — I10 Essential (primary) hypertension: Secondary | ICD-10-CM

## 2020-05-14 DIAGNOSIS — R7303 Prediabetes: Secondary | ICD-10-CM

## 2020-05-14 NOTE — Progress Notes (Signed)
[facility]   Established Patient Office Visit  Subjective:  Patient ID: Nancy Pham, female    DOB: 11-14-1965  Age: 54 y.o. MRN: [id number]  CC:  Chief Complaint  Patient presents with  . Follow-up    Pt states she doesn't really have any question or concerns to discuss today.Pt did states later she is concerned about her BS    HPI Nancy Pham is a 54 year old female who presents for Follow Up today.    Patient Active Problem List   Diagnosis Date Noted  . Hemoglobin A1C between 7% and 9% indicating borderline diabetic control 02/14/2019  . 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 Rash 02/14/2019  . Itching 02/14/2019  . Enlargement of lymph node 02/14/2019  . Edema, peripheral 02/14/2019  . Bilateral low back pain without sciatica 02/14/2019  . Chest pain 08/21/2017  . GERD (gastroesophageal reflux disease) 08/21/2017  . Gout 08/21/2017  . Diabetes mellitus without complication ([place]) 44/81/8563  . Hypertension   . Hyperlipidemia   . Asthma   . SOB (shortness of breath) 03/27/2017  . Anaphylaxis 03/20/2017    Current Status: Since her last office visit, she is doing well with no complaints. She denies visual changes, chest pain, cough, shortness of breath, heart palpitations, and falls. She has occasional headaches and dizziness with position changes. Denies severe headaches, confusion, seizures, double vision, and blurred vision, nausea and vomiting. She denies fevers, chills, fatigue, recent infections, weight loss, and night sweats. She does not monitor her blood glucose levels regularly. Denies GI problems such as nausea, vomiting, diarrhea, and constipation. She has no reports of blood in stools, dysuria and hematuria. No depression or anxiety, and denies suicidal ideations, homicidal ideations, or auditory hallucinations. She is taking all medications as  prescribed.     Past Medical History:  Diagnosis Date  . Asthma   . Cancer ([place])   . Diabetes mellitus   . Gout   . Hyperlipidemia   . Hypertension     Past Surgical History:  Procedure Laterality Date  . ABDOMINAL HYSTERECTOMY      Family History  Problem Relation Age of Onset  . Seizures Mother   . Heart attack Father   . Heart attack Sister   . Hypertension Sister   . Hypertension Brother   . Hypertension Brother   . Hypertension Brother   . Hypertension Brother   . Hypertension Sister     Social History   Socioeconomic History  . Marital status: Single    Spouse name: Not on file  . Number of children: Not on file  . Years of education: Not on file  . Highest education level: Not on file  Occupational History  . Not on file  Tobacco Use  . Smoking status: Never Smoker  . Smokeless tobacco: Never Used  Vaping Use  . Vaping Use: Never used  Substance and Sexual Activity  . Alcohol use: No  . Drug use: No  . Sexual activity: Not Currently  Other Topics Concern  . Not on file  Social History Narrative  . Not on file   Social Determinants of Health   Financial Resource Strain:   . Difficulty of Paying Living Expenses: Not on file  Food Insecurity:   . Worried About Charity fundraiser in the Last Year: Not on file  . Ran Out of Food in the Last Year: Not on file  Transportation Needs:   . Film/video editor (Medical): Not on file  . Lack of Transportation (Non-Medical): Not on file  Physical Activity:   . Days of Exercise per Week: Not on file  . Minutes of Exercise per Session: Not on file  Stress:   . Feeling of Stress : Not on file  Social Connections:   . Frequency of Communication with Friends and Family: Not on file  . Frequency of Social Gatherings with Friends and Family: Not on file  . Attends Religious Services: Not on file  . Active Member of Clubs or Organizations: Not on file  . Attends Archivist Meetings: Not on  file  . Marital Status: Not on file  Intimate Partner Violence:   . Fear of Current or Ex-Partner: Not on file  . Emotionally Abused: Not on file  . Physically Abused: Not on file  . Sexually Abused: Not on file    Outpatient Medications Prior to Visit  Medication Sig Dispense Refill  . acetaminophen (TYLENOL) 325 MG tablet Take 325-650 mg by mouth every 6 (six) hours as needed for mild pain, fever or headache.     . albuterol (VENTOLIN HFA) 108 (90 Base) MCG/ACT inhaler Inhale 1-2 puffs into the lungs every 6 (six) hours as needed for wheezing or shortness of breath. Please dispense 3 months supple. 8 g 11  . allopurinol (ZYLOPRIM) 300 MG tablet Take 1 tablet (300 mg total) by mouth daily. 90 tablet 3  . aspirin EC 81 MG tablet Take 1 tablet (81 mg total) by mouth daily. 90 tablet 1  . Cholecalciferol (VITAMIN D3) 125 MCG (5000 UT) CAPS Take 1 capsule (5,000 Units total) by mouth daily. 15 capsule 1  . cyclobenzaprine (FLEXERIL) 10 MG tablet TAKE ONE TABLET BY MOUTH THREE TIMES DAILY AS NEEDED FOR MUSCLE SPASMS. 90 tablet 2  . EPINEPHrine 0.3 mg/0.3 mL IJ SOAJ injection Inject 0.3 mLs (0.3 mg total) into the muscle as needed (acute resp failure/worsening anaphylaxis). 3 each 3  . famotidine (PEPCID) 20 MG tablet Take 1 tablet (20 mg total) by mouth 2 (two) times daily. 180 tablet 3  . ferrous sulfate 325 (65 FE) MG tablet Take 1 tablet (325 mg total) by mouth daily with breakfast. 90 tablet 3  . fluticasone (FLONASE) 50 MCG/ACT nasal spray Place 2 sprays into both nostrils daily as needed for allergies or rhinitis. 11.1 mL 11  . furosemide (LASIX) 40 MG tablet Take 1 tablet (40 mg total) by mouth 2 (two) times daily as needed for edema. 120 tablet 2  . gabapentin (NEURONTIN) 100 MG capsule Take 1 capsule (100 mg total) by mouth 3 (three) times daily. 270 capsule 3  . glimepiride (AMARYL) 2 MG tablet Take 1 tablet (2 mg total) by mouth daily with breakfast. 90 tablet 3  . hydrocortisone cream  (HYDROCORTISONE ANTI-ITCH) 1 % Apply 1 application topically 2 (two) times daily. 30 g 3  . meloxicam (MOBIC) 15 MG tablet TAKE ONE TABLET BY MOUTH EVERY DAY AS NEEDED FOR PAIN 60 tablet 2  . montelukast (SINGULAIR) 10 MG tablet Take 1 tablet (10 mg total) by mouth at bedtime. 90 tablet 3  . Multiple Vitamins-Minerals (CENTRUM SILVER 50+WOMEN) TABS Take 1 tablet by mouth daily. 90 tablet 1  . NIFEdipine (PROCARDIA XL/NIFEDICAL-XL) 90 MG 24 hr tablet Take 1 tablet (90 mg total) by mouth daily. 90 tablet 1  . potassium chloride SA (KLOR-CON) 20 MEQ tablet Take 1 tablet (20 mEq total) by mouth  daily. 90 tablet 1  . Probiotic Product (PROBIOTIC DAILY PO) Take 1 capsule by mouth daily.    . rosuvastatin (CRESTOR) 10 MG tablet Take 1 tablet (10 mg total) by mouth daily. 90 tablet 3  . triamcinolone cream (KENALOG) 0.1 % Apply 1 application topically 2 (two) times daily. 30 g 6  . atenolol (TENORMIN) 100 MG tablet Take 1 tablet (100 mg total) by mouth daily. (Patient not taking: Reported on 05/14/2020) 90 tablet 3   No facility-administered medications prior to visit.    Allergies  Allergen Reactions  . Ace Inhibitors Swelling    Swelling of the lips  . Tape Rash    No Band-Aids!!    ROS Review of Systems  Constitutional: Negative.   HENT: Negative.   Eyes: Negative.   Respiratory: Negative.   Cardiovascular: Negative.   Gastrointestinal: Negative.   Endocrine: Negative.   Genitourinary: Negative.   Musculoskeletal: Positive for arthralgias (generalized joint pain).  Skin: Negative.   Allergic/Immunologic: Negative.   Neurological: Positive for dizziness (occasional ) and headaches (occasional ).  Hematological: Negative.   Psychiatric/Behavioral: Negative.       Objective:    Physical Exam Vitals and nursing note reviewed.  Constitutional:      Appearance: Normal appearance. She is obese.  HENT:     Head: Normocephalic and atraumatic.     Mouth/Throat:     Mouth: Mucous  membranes are moist.     Pharynx: Oropharynx is clear.  Cardiovascular:     Rate and Rhythm: Normal rate and regular rhythm.     Pulses: Normal pulses.     Heart sounds: Normal heart sounds.  Abdominal:     General: Bowel sounds are normal.     Palpations: Abdomen is soft.  Musculoskeletal:        General: Normal range of motion.     Cervical back: Normal range of motion and neck supple.  Skin:    General: Skin is warm and dry.  Neurological:     General: No focal deficit present.     Mental Status: She is alert and oriented to person, place, and time.  Psychiatric:        Mood and Affect: Mood normal.        Behavior: Behavior normal.        Thought Content: Thought content normal.        Judgment: Judgment normal.     BP (!) 145/77   Pulse 91   Temp 97.7 F (36.5 C)   Resp 18   Ht 5\' 6"  (1.676 m)   Wt (!) 302 lb 6.4 oz (137.2 kg)   SpO2 99%   BMI 48.81 kg/m  Wt Readings from Last 3 Encounters:  05/14/20 (!) 302 lb 6.4 oz (137.2 kg)  04/14/20 (!) 301 lb 3.2 oz (136.6 kg)  10/12/19 (!) 300 lb 6.4 oz (136.3 kg)     Health Maintenance Due  Topic Date Due  . Hepatitis C Screening  Never done  . PNEUMOCOCCAL POLYSACCHARIDE VACCINE AGE 93-64 HIGH RISK  Never done  . OPHTHALMOLOGY EXAM  Never done  . COVID-19 Vaccine (1) Never done  . PAP SMEAR-Modifier  Never done  . COLONOSCOPY  Never done  . MAMMOGRAM  10/23/2017  . FOOT EXAM  12/03/2018  . URINE MICROALBUMIN  12/03/2018  . INFLUENZA VACCINE  Never done    There are no preventive care reminders to display for this patient.  Lab Results  Component Value Date  TSH 1.710 08/15/2019   Lab Results  Component Value Date   WBC 6.0 08/15/2019   HGB 13.2 08/15/2019   HCT 39.1 08/15/2019   MCV 86 08/15/2019   PLT 340 08/15/2019   Lab Results  Component Value Date   NA 140 08/15/2019   K 3.7 08/15/2019   CO2 27 08/15/2019   GLUCOSE 105 (H) 08/15/2019   BUN 10 08/15/2019   CREATININE 0.74 08/15/2019    BILITOT 0.4 08/15/2019   ALKPHOS 112 08/15/2019   AST 21 08/15/2019   ALT 19 08/15/2019   PROT 7.4 08/15/2019   ALBUMIN 4.3 08/15/2019   CALCIUM 10.2 08/15/2019   ANIONGAP 14 08/21/2017   Lab Results  Component Value Date   CHOL 269 (H) 08/15/2019   Lab Results  Component Value Date   HDL 48 08/15/2019   Lab Results  Component Value Date   LDLCALC 181 (H) 08/15/2019   Lab Results  Component Value Date   TRIG 213 (H) 08/15/2019   Lab Results  Component Value Date   CHOLHDL 5.6 (H) 08/15/2019   Lab Results  Component Value Date   HGBA1C 8.1 (A) 04/14/2020   HGBA1C 8.1 04/14/2020   HGBA1C 8.1 (A) 04/14/2020   HGBA1C 8.1 (A) 04/14/2020      Assessment & Plan:   1. Diabetes mellitus without complication (HCC) Stable. She will continue medication as prescribed, to decrease foods/beverages high in sugars and carbs and follow Heart Healthy or DASH diet. Increase physical activity to at least 30 minutes cardio exercise daily.   2. Hemoglobin A1C between 7% and 9% indicating borderline diabetic control Most recent Hgb A1c is stable at 8.1. monitor.   3. Essential hypertension The current medical regimen is effective; blood pressure is stable at 145/77 today; continue present plan and medications as prescribed. She will continue to take medications as prescribed, to decrease high sodium intake, excessive alcohol intake, increase potassium intake, smoking cessation, and increase physical activity of at least 30 minutes of cardio activity daily. She will continue to follow Heart Healthy or DASH diet.  4. Chronic bilateral low back pain with bilateral sciatica  5. Edema, peripheral 1+ lower extremity edema. Stable, not worsening. Monitor.   6. Follow up She will keep scheduled follow up appointment.   No orders of the defined types were placed in this encounter.   No orders of the defined types were placed in this encounter.   Referral Orders  No referral(s)  requested today    Kathe Becton,  MSN, FNP-BC [facility] [address] [place], [place] [address] [phone] fax   Problem List Items Addressed This Visit      Endocrine   Diabetes mellitus without complication ([place]) - Primary   Hemoglobin A1C between 7% and 9% indicating borderline diabetic control     Other   Edema, peripheral    Other Visit Diagnoses    Essential hypertension       Chronic bilateral low back pain with bilateral sciatica       Follow up         No orders of the defined types were placed in this encounter.   Follow-up: No follow-ups on file.    Azzie Glatter, FNP

## 2020-05-19 ENCOUNTER — Encounter: Payer: Self-pay | Admitting: Family Medicine

## 2020-08-13 ENCOUNTER — Encounter: Payer: Self-pay | Admitting: Family Medicine

## 2020-08-26 ENCOUNTER — Encounter: Payer: Self-pay | Admitting: Family Medicine

## 2020-09-10 ENCOUNTER — Encounter: Payer: Self-pay | Admitting: Family Medicine

## 2020-09-10 ENCOUNTER — Other Ambulatory Visit: Payer: Self-pay

## 2020-09-10 ENCOUNTER — Ambulatory Visit (INDEPENDENT_AMBULATORY_CARE_PROVIDER_SITE_OTHER): Payer: Self-pay | Admitting: Family Medicine

## 2020-09-10 VITALS — BP 190/104 | HR 55 | Temp 97.7°F | Ht 66.0 in | Wt 302.0 lb

## 2020-09-10 DIAGNOSIS — R600 Localized edema: Secondary | ICD-10-CM

## 2020-09-10 DIAGNOSIS — M5442 Lumbago with sciatica, left side: Secondary | ICD-10-CM

## 2020-09-10 DIAGNOSIS — R739 Hyperglycemia, unspecified: Secondary | ICD-10-CM

## 2020-09-10 DIAGNOSIS — M1A09X0 Idiopathic chronic gout, multiple sites, without tophus (tophi): Secondary | ICD-10-CM

## 2020-09-10 DIAGNOSIS — I10 Essential (primary) hypertension: Secondary | ICD-10-CM

## 2020-09-10 DIAGNOSIS — G8929 Other chronic pain: Secondary | ICD-10-CM

## 2020-09-10 DIAGNOSIS — R609 Edema, unspecified: Secondary | ICD-10-CM

## 2020-09-10 DIAGNOSIS — Z09 Encounter for follow-up examination after completed treatment for conditions other than malignant neoplasm: Secondary | ICD-10-CM

## 2020-09-10 DIAGNOSIS — Z0000 Encounter for general adult medical examination without abnormal findings: Secondary | ICD-10-CM

## 2020-09-10 DIAGNOSIS — E119 Type 2 diabetes mellitus without complications: Secondary | ICD-10-CM

## 2020-09-10 DIAGNOSIS — M5441 Lumbago with sciatica, right side: Secondary | ICD-10-CM

## 2020-09-10 DIAGNOSIS — Z1231 Encounter for screening mammogram for malignant neoplasm of breast: Secondary | ICD-10-CM

## 2020-09-10 DIAGNOSIS — R7303 Prediabetes: Secondary | ICD-10-CM

## 2020-09-10 LAB — POCT GLYCOSYLATED HEMOGLOBIN (HGB A1C)
HbA1c POC (<> result, manual entry): 7.9 % (ref 4.0–5.6)
HbA1c, POC (controlled diabetic range): 7.9 % — AB (ref 0.0–7.0)
HbA1c, POC (prediabetic range): 7.9 % — AB (ref 5.7–6.4)
Hemoglobin A1C: 7.9 % — AB (ref 4.0–5.6)

## 2020-09-10 MED ORDER — ALLOPURINOL 300 MG PO TABS: 300.0000 mg | ORAL_TABLET | Freq: Every day | ORAL | 3 refills | Status: DC

## 2020-09-10 NOTE — Progress Notes (Unsigned)
Patient [facility]    Established Patient Office Visit  Subjective:  Patient ID: Nancy Pham, female    DOB: [birth year]  Age: 55 y.o. MRN: [id number]  CC:  Chief Complaint  Patient presents with  . Annual Exam    Physical     HPI Nancy Pham is a 55 year old female who presents for Follow Up today.   Patient Active Problem List   Diagnosis Date Noted  . Hemoglobin A1C between 7% and 9% indicating borderline diabetic control 02/14/2019  . 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 Rash 02/14/2019  . Itching 02/14/2019  . Enlargement of lymph node 02/14/2019  . Edema, peripheral 02/14/2019  . Bilateral low back pain without sciatica 02/14/2019  . Chest pain 08/21/2017  . GERD (gastroesophageal reflux disease) 08/21/2017  . Gout 08/21/2017  . Diabetes mellitus without complication ([place]) 41/66/0630  . Hypertension   . Hyperlipidemia   . Asthma   . SOB (shortness of breath) 03/27/2017  . Anaphylaxis 03/20/2017    Current Status: Since *** last office visit, *** is doing well with no complaints.    *** denies visual changes, chest pain, cough, shortness of breath, heart palpitations, and falls. *** has occasional headaches and dizziness with position changes. Denies severe headaches, confusion, seizures, double vision, and blurred vision, nausea and vomiting.    *** denies fevers, chills, fatigue, recent infections, weight loss, and night sweats.  Denies GI problems such as diarrhea, and constipation. *** has no reports of blood in stools, dysuria and hematuria. No depression or anxiety reported today. *** is *** all medications as prescribed. *** denies pain today.    Past Medical History:  Diagnosis Date  . Asthma   . Cancer ([place])   . Diabetes mellitus   . Gout   . Hyperlipidemia   . Hypertension     Past Surgical History:  Procedure Laterality Date  .  ABDOMINAL HYSTERECTOMY      Family History  Problem Relation Age of Onset  . Seizures Mother   . Heart attack Father   . Heart attack Sister   . Hypertension Sister   . Hypertension Brother   . Hypertension Brother   . Hypertension Brother   . Hypertension Brother   . Hypertension Sister     Social History   Socioeconomic History  . Marital status: Single    Spouse name: Not on file  . Number of children: Not on file  . Years of education: Not on file  . Highest education level: Not on file  Occupational History  . Not on file  Tobacco Use  . Smoking status: Never Smoker  . Smokeless tobacco: Never Used  Vaping Use  . Vaping Use: Never used  Substance and Sexual Activity  . Alcohol use: No  . Drug use: No  . Sexual activity: Not Currently  Other Topics Concern  . Not on file  Social History Narrative  . Not on file   Social Determinants of Health   Financial Resource Strain: Not on file  Food Insecurity: Not on file  Transportation Needs: Not on file  Physical Activity: Not on file  Stress: Not on file  Social Connections: Not on file  Intimate Partner Violence: Not on file    Outpatient Medications Prior to Visit  Medication Sig Dispense Refill  . acetaminophen (TYLENOL) 325 MG tablet Take 325-650 mg by mouth every 6 (six) hours as needed for  mild pain, fever or headache.     . albuterol (VENTOLIN HFA) 108 (90 Base) MCG/ACT inhaler Inhale 1-2 puffs into the lungs every 6 (six) hours as needed for wheezing or shortness of breath. Please dispense 3 months supple. 8 g 11  . aspirin EC 81 MG tablet Take 1 tablet (81 mg total) by mouth daily. 90 tablet 1  . Cholecalciferol (VITAMIN D3) 125 MCG (5000 UT) CAPS Take 1 capsule (5,000 Units total) by mouth daily. 15 capsule 1  . cyclobenzaprine (FLEXERIL) 10 MG tablet TAKE ONE TABLET BY MOUTH THREE TIMES DAILY AS NEEDED FOR MUSCLE SPASMS. 90 tablet 2  . famotidine (PEPCID) 20 MG tablet Take 1 tablet (20 mg total) by  mouth 2 (two) times daily. 180 tablet 3  . ferrous sulfate 325 (65 FE) MG tablet Take 1 tablet (325 mg total) by mouth daily with breakfast. 90 tablet 3  . fluticasone (FLONASE) 50 MCG/ACT nasal spray Place 2 sprays into both nostrils daily as needed for allergies or rhinitis. 11.1 mL 11  . furosemide (LASIX) 40 MG tablet Take 1 tablet (40 mg total) by mouth 2 (two) times daily as needed for edema. 120 tablet 2  . gabapentin (NEURONTIN) 100 MG capsule Take 1 capsule (100 mg total) by mouth 3 (three) times daily. 270 capsule 3  . glimepiride (AMARYL) 2 MG tablet Take 1 tablet (2 mg total) by mouth daily with breakfast. 90 tablet 3  . hydrocortisone cream (HYDROCORTISONE ANTI-ITCH) 1 % Apply 1 application topically 2 (two) times daily. 30 g 3  . meloxicam (MOBIC) 15 MG tablet TAKE ONE TABLET BY MOUTH EVERY DAY AS NEEDED FOR PAIN 60 tablet 2  . montelukast (SINGULAIR) 10 MG tablet Take 1 tablet (10 mg total) by mouth at bedtime. 90 tablet 3  . Multiple Vitamins-Minerals (CENTRUM SILVER 50+WOMEN) TABS Take 1 tablet by mouth daily. 90 tablet 1  . Probiotic Product (PROBIOTIC DAILY PO) Take 1 capsule by mouth daily.    . rosuvastatin (CRESTOR) 10 MG tablet Take 1 tablet (10 mg total) by mouth daily. 90 tablet 3  . triamcinolone cream (KENALOG) 0.1 % Apply 1 application topically 2 (two) times daily. 30 g 6  . allopurinol (ZYLOPRIM) 300 MG tablet Take 1 tablet (300 mg total) by mouth daily. 90 tablet 3  . EPINEPHrine 0.3 mg/0.3 mL IJ SOAJ injection Inject 0.3 mLs (0.3 mg total) into the muscle as needed (acute resp failure/worsening anaphylaxis). (Patient not taking: Reported on 09/10/2020) 3 each 3  . NIFEdipine (PROCARDIA XL/NIFEDICAL-XL) 90 MG 24 hr tablet Take 1 tablet (90 mg total) by mouth daily. (Patient not taking: Reported on 09/10/2020) 90 tablet 1  . potassium chloride SA (KLOR-CON) 20 MEQ tablet Take 1 tablet (20 mEq total) by mouth daily. 90 tablet 1   No facility-administered medications prior  to visit.    Allergies  Allergen Reactions  . Ace Inhibitors Swelling    Swelling of the lips  . Tape Rash    No Band-Aids!!    ROS Review of Systems    Objective:    Physical Exam  BP (!) 190/104 (BP Location: Left Arm, Patient Position: Sitting, Cuff Size: Large)   Pulse (!) 55   Temp 97.7 F (36.5 C) (Temporal)   Ht 5\' 6"  (1.676 m)   Wt (!) 302 lb (137 kg)   SpO2 99%   BMI 48.74 kg/m  Wt Readings from Last 3 Encounters:  09/10/20 (!) 302 lb (137 kg)  05/14/20 (!) 302 lb  6.4 oz (137.2 kg)  04/14/20 (!) 301 lb 3.2 oz (136.6 kg)     Health Maintenance Due  Topic Date Due  . Hepatitis C Screening  Never done  . PNEUMOCOCCAL POLYSACCHARIDE VACCINE AGE 8-64 HIGH RISK  Never done  . COVID-19 Vaccine (1) Never done  . OPHTHALMOLOGY EXAM  Never done  . PAP SMEAR-Modifier  Never done  . COLONOSCOPY (Pts 45-79yrs Insurance coverage will need to be confirmed)  Never done  . MAMMOGRAM  10/23/2017  . FOOT EXAM  12/03/2018  . URINE MICROALBUMIN  12/03/2018  . INFLUENZA VACCINE  Never done    There are no preventive care reminders to display for this patient.  Lab Results  Component Value Date   TSH 1.710 08/15/2019   Lab Results  Component Value Date   WBC 6.0 08/15/2019   HGB 13.2 08/15/2019   HCT 39.1 08/15/2019   MCV 86 08/15/2019   PLT 340 08/15/2019   Lab Results  Component Value Date   NA 140 08/15/2019   K 3.7 08/15/2019   CO2 27 08/15/2019   GLUCOSE 105 (H) 08/15/2019   BUN 10 08/15/2019   CREATININE 0.74 08/15/2019   BILITOT 0.4 08/15/2019   ALKPHOS 112 08/15/2019   AST 21 08/15/2019   ALT 19 08/15/2019   PROT 7.4 08/15/2019   ALBUMIN 4.3 08/15/2019   CALCIUM 10.2 08/15/2019   ANIONGAP 14 08/21/2017   Lab Results  Component Value Date   CHOL 269 (H) 08/15/2019   Lab Results  Component Value Date   HDL 48 08/15/2019   Lab Results  Component Value Date   LDLCALC 181 (H) 08/15/2019   Lab Results  Component Value Date   TRIG 213  (H) 08/15/2019   Lab Results  Component Value Date   CHOLHDL 5.6 (H) 08/15/2019   Lab Results  Component Value Date   HGBA1C 7.9 (A) 09/10/2020   HGBA1C 7.9 09/10/2020   HGBA1C 7.9 (A) 09/10/2020   HGBA1C 7.9 (A) 09/10/2020      Assessment & Plan:   Problem List Items Addressed This Visit      Endocrine   Diabetes mellitus without complication ([place]) - Primary   Relevant Orders   POCT Urinalysis Dipstick   POCT glycosylated hemoglobin (Hb A1C) (Completed)   Hemoglobin A1C between 7% and 9% indicating borderline diabetic control     Other   Edema, peripheral   Gout   Relevant Medications   allopurinol (ZYLOPRIM) 300 MG tablet    Other Visit Diagnoses    Hyperglycemia       Essential hypertension       Chronic bilateral low back pain with bilateral sciatica       Breast cancer screening by mammogram       Relevant Orders   MM 3D [place] maintenance       Relevant Orders   CBC with Differential   Comprehensive metabolic panel   Lipid Panel   TSH   Vitamin B12   Vitamin D, 25-hydroxy   Follow up          Meds ordered this encounter  Medications  . allopurinol (ZYLOPRIM) 300 MG tablet    Sig: Take 1 tablet (300 mg total) by mouth daily.    Dispense:  90 tablet    Refill:  3    Follow-up: No follow-ups on file.    Azzie Glatter, FNP

## 2020-09-11 ENCOUNTER — Encounter: Payer: Self-pay | Admitting: Family Medicine

## 2020-09-11 LAB — CBC WITH DIFFERENTIAL/PLATELET
Basophils Absolute: 0 10*3/uL (ref 0.0–0.2)
Basos: 1 %
EOS (ABSOLUTE): 0.2 10*3/uL (ref 0.0–0.4)
Eos: 3 %
Hematocrit: 37.1 % (ref 34.0–46.6)
Hemoglobin: 12.4 g/dL (ref 11.1–15.9)
Immature Grans (Abs): 0 10*3/uL (ref 0.0–0.1)
Immature Granulocytes: 0 %
Lymphocytes Absolute: 2.8 10*3/uL (ref 0.7–3.1)
Lymphs: 43 %
MCH: 28.2 pg (ref 26.6–33.0)
MCHC: 33.4 g/dL (ref 31.5–35.7)
MCV: 84 fL (ref 79–97)
Monocytes Absolute: 0.5 10*3/uL (ref 0.1–0.9)
Monocytes: 8 %
Neutrophils Absolute: 3 10*3/uL (ref 1.4–7.0)
Neutrophils: 45 %
Platelets: 320 10*3/uL (ref 150–450)
RBC: 4.4 x10E6/uL (ref 3.77–5.28)
RDW: 13.9 % (ref 11.7–15.4)
WBC: 6.6 10*3/uL (ref 3.4–10.8)

## 2020-09-11 LAB — POCT URINALYSIS DIPSTICK
Bilirubin, UA: NEGATIVE
Blood, UA: NEGATIVE
Glucose, UA: NEGATIVE
Ketones, UA: NEGATIVE
Leukocytes, UA: NEGATIVE
Nitrite, UA: NEGATIVE
Protein, UA: NEGATIVE
Spec Grav, UA: 1.02 (ref 1.010–1.025)
Urobilinogen, UA: 0.2 E.U./dL
pH, UA: 7 (ref 5.0–8.0)

## 2020-09-11 LAB — COMPREHENSIVE METABOLIC PANEL
ALT: 19 IU/L (ref 0–32)
AST: 21 IU/L (ref 0–40)
Albumin/Globulin Ratio: 1.4 (ref 1.2–2.2)
Albumin: 4.2 g/dL (ref 3.8–4.9)
Alkaline Phosphatase: 105 IU/L (ref 44–121)
BUN/Creatinine Ratio: 14 (ref 9–23)
BUN: 10 mg/dL (ref 6–24)
Bilirubin Total: 0.3 mg/dL (ref 0.0–1.2)
CO2: 25 mmol/L (ref 20–29)
Calcium: 9.1 mg/dL (ref 8.7–10.2)
Chloride: 98 mmol/L (ref 96–106)
Creatinine, Ser: 0.71 mg/dL (ref 0.57–1.00)
GFR calc Af Amer: 111 mL/min/{1.73_m2} (ref >59)
GFR calc non Af Amer: 96 mL/min/{1.73_m2} (ref >59)
Globulin, Total: 3.1 g/dL (ref 1.5–4.5)
Glucose: 140 mg/dL — ABNORMAL HIGH (ref 65–99)
Potassium: 4.4 mmol/L (ref 3.5–5.2)
Sodium: 141 mmol/L (ref 134–144)
Total Protein: 7.3 g/dL (ref 6.0–8.5)

## 2020-09-11 LAB — LIPID PANEL
Chol/HDL Ratio: 3.6 ratio (ref 0.0–4.4)
Cholesterol, Total: 208 mg/dL — ABNORMAL HIGH (ref 100–199)
HDL: 57 mg/dL (ref >39)
LDL Chol Calc (NIH): 106 mg/dL — ABNORMAL HIGH (ref 0–99)
Triglycerides: 265 mg/dL — ABNORMAL HIGH (ref 0–149)
VLDL Cholesterol Cal: 45 mg/dL — ABNORMAL HIGH (ref 5–40)

## 2020-09-11 LAB — VITAMIN D 25 HYDROXY (VIT D DEFICIENCY, FRACTURES): Vit D, 25-Hydroxy: 41.9 ng/mL (ref 30.0–100.0)

## 2020-09-11 LAB — TSH: TSH: 1.8 u[IU]/mL (ref 0.450–4.500)

## 2020-09-11 LAB — VITAMIN B12: Vitamin B-12: 869 pg/mL (ref 232–1245)

## 2020-09-11 NOTE — Progress Notes (Signed)
Patient [facility]    Annual Physical  Subjective:  Patient ID: Nancy Pham, female    DOB: 1966/01/19  Age: 55 y.o. MRN: [id number]  CC:  Chief Complaint  Patient presents with  . Annual Exam    Physical     HPI Nancy Pham is a 55 year old female who presents for Annual Physical today.   Patient Active Problem List   Diagnosis Date Noted  . Hemoglobin A1C between 7% and 9% indicating borderline diabetic control 02/14/2019  . 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 Rash 02/14/2019  . Itching 02/14/2019  . Enlargement of lymph node 02/14/2019  . Edema, peripheral 02/14/2019  . Bilateral low back pain without sciatica 02/14/2019  . Chest pain 08/21/2017  . GERD (gastroesophageal reflux disease) 08/21/2017  . Gout 08/21/2017  . Diabetes mellitus without complication ([place]) 70/35/0093  . Hypertension   . Hyperlipidemia   . Asthma   . SOB (shortness of breath) 03/27/2017  . Anaphylaxis 03/20/2017    Current Status: Since her last office visit, she is doing well with no complaints. She does not monitor her blood glucose levels regularly. She denies fatigue, frequent urination, blurred vision, excessive hunger, excessive thirst, weight gain, weight loss, and poor wound healing. She continues to check her feet regularly. Blood pressures are elevated today, as patient has not taken hypertensive medications as of yet. She denies visual changes, chest pain, cough, shortness of breath, heart palpitations, and falls. She has occasional headaches and dizziness with position changes. Denies severe headaches, confusion, seizures, double vision, and blurred vision, nausea and vomiting. She denies fevers, chills, fatigue, recent infections, weight loss, and night sweats.  Denies GI problems such as diarrhea, and constipation. She has no reports of blood in stools, dysuria and hematuria. No  depression or anxiety reported today. She is taking all medications as prescribed. She denies pain today.    Past Medical History:  Diagnosis Date  . Asthma   . Cancer ([place])   . Diabetes mellitus   . Gout   . Hyperlipidemia   . Hypertension     Past Surgical History:  Procedure Laterality Date  . ABDOMINAL HYSTERECTOMY      Family History  Problem Relation Age of Onset  . Seizures Mother   . Heart attack Father   . Heart attack Sister   . Hypertension Sister   . Hypertension Brother   . Hypertension Brother   . Hypertension Brother   . Hypertension Brother   . Hypertension Sister     Social History   Socioeconomic History  . Marital status: Single    Spouse name: Not on file  . Number of children: Not on file  . Years of education: Not on file  . Highest education level: Not on file  Occupational History  . Not on file  Tobacco Use  . Smoking status: Never Smoker  . Smokeless tobacco: Never Used  Vaping Use  . Vaping Use: Never used  Substance and Sexual Activity  . Alcohol use: No  . Drug use: No  . Sexual activity: Not Currently  Other Topics Concern  . Not on file  Social History Narrative  . Not on file   Social Determinants of Health   Financial Resource Strain: Not on file  Food Insecurity: Not on file  Transportation Needs: Not on file  Physical Activity: Not on file  Stress: Not on file  Social Connections:  Not on file  Intimate Partner Violence: Not on file    Outpatient Medications Prior to Visit  Medication Sig Dispense Refill  . acetaminophen (TYLENOL) 325 MG tablet Take 325-650 mg by mouth every 6 (six) hours as needed for mild pain, fever or headache.     . albuterol (VENTOLIN HFA) 108 (90 Base) MCG/ACT inhaler Inhale 1-2 puffs into the lungs every 6 (six) hours as needed for wheezing or shortness of breath. Please dispense 3 months supple. 8 g 11  . aspirin EC 81 MG tablet Take 1 tablet (81 mg total) by mouth daily. 90 tablet 1  .  Cholecalciferol (VITAMIN D3) 125 MCG (5000 UT) CAPS Take 1 capsule (5,000 Units total) by mouth daily. 15 capsule 1  . cyclobenzaprine (FLEXERIL) 10 MG tablet TAKE ONE TABLET BY MOUTH THREE TIMES DAILY AS NEEDED FOR MUSCLE SPASMS. 90 tablet 2  . famotidine (PEPCID) 20 MG tablet Take 1 tablet (20 mg total) by mouth 2 (two) times daily. 180 tablet 3  . ferrous sulfate 325 (65 FE) MG tablet Take 1 tablet (325 mg total) by mouth daily with breakfast. 90 tablet 3  . fluticasone (FLONASE) 50 MCG/ACT nasal spray Place 2 sprays into both nostrils daily as needed for allergies or rhinitis. 11.1 mL 11  . furosemide (LASIX) 40 MG tablet Take 1 tablet (40 mg total) by mouth 2 (two) times daily as needed for edema. 120 tablet 2  . gabapentin (NEURONTIN) 100 MG capsule Take 1 capsule (100 mg total) by mouth 3 (three) times daily. 270 capsule 3  . glimepiride (AMARYL) 2 MG tablet Take 1 tablet (2 mg total) by mouth daily with breakfast. 90 tablet 3  . hydrocortisone cream (HYDROCORTISONE ANTI-ITCH) 1 % Apply 1 application topically 2 (two) times daily. 30 g 3  . meloxicam (MOBIC) 15 MG tablet TAKE ONE TABLET BY MOUTH EVERY DAY AS NEEDED FOR PAIN 60 tablet 2  . montelukast (SINGULAIR) 10 MG tablet Take 1 tablet (10 mg total) by mouth at bedtime. 90 tablet 3  . Multiple Vitamins-Minerals (CENTRUM SILVER 50+WOMEN) TABS Take 1 tablet by mouth daily. 90 tablet 1  . Probiotic Product (PROBIOTIC DAILY PO) Take 1 capsule by mouth daily.    . rosuvastatin (CRESTOR) 10 MG tablet Take 1 tablet (10 mg total) by mouth daily. 90 tablet 3  . triamcinolone cream (KENALOG) 0.1 % Apply 1 application topically 2 (two) times daily. 30 g 6  . allopurinol (ZYLOPRIM) 300 MG tablet Take 1 tablet (300 mg total) by mouth daily. 90 tablet 3  . EPINEPHrine 0.3 mg/0.3 mL IJ SOAJ injection Inject 0.3 mLs (0.3 mg total) into the muscle as needed (acute resp failure/worsening anaphylaxis). (Patient not taking: Reported on 09/10/2020) 3 each 3  .  NIFEdipine (PROCARDIA XL/NIFEDICAL-XL) 90 MG 24 hr tablet Take 1 tablet (90 mg total) by mouth daily. (Patient not taking: Reported on 09/10/2020) 90 tablet 1  . potassium chloride SA (KLOR-CON) 20 MEQ tablet Take 1 tablet (20 mEq total) by mouth daily. 90 tablet 1   No facility-administered medications prior to visit.    Allergies  Allergen Reactions  . Ace Inhibitors Swelling    Swelling of the lips  . Tape Rash    No Band-Aids!!    ROS Review of Systems  Constitutional: Negative.   HENT: Negative.   Eyes: Negative.   Respiratory: Negative.   Cardiovascular: Negative.   Gastrointestinal: Negative.   Endocrine: Negative.   Genitourinary: Negative.   Musculoskeletal: Negative.  Skin: Negative.   Allergic/Immunologic: Negative.   Neurological: Positive for dizziness (occasional ) and headaches (occasional ).  Hematological: Negative.   Psychiatric/Behavioral: Negative.       Objective:    Physical Exam Vitals and nursing note reviewed.  Constitutional:      Appearance: Normal appearance. She is obese.  HENT:     Head: Normocephalic and atraumatic.     Nose: Nose normal.     Mouth/Throat:     Mouth: Mucous membranes are moist.     Pharynx: Oropharynx is clear.  Cardiovascular:     Rate and Rhythm: Normal rate and regular rhythm.     Pulses: Normal pulses.     Heart sounds: Normal heart sounds.  Pulmonary:     Effort: Pulmonary effort is normal.     Breath sounds: Normal breath sounds.  Abdominal:     General: Bowel sounds are normal. There is distension (obese).     Palpations: Abdomen is soft.  Musculoskeletal:        General: Normal range of motion.     Cervical back: Normal range of motion and neck supple.  Skin:    General: Skin is warm and dry.  Neurological:     General: No focal deficit present.     Mental Status: She is alert and oriented to person, place, and time.  Psychiatric:        Mood and Affect: Mood normal.        Behavior: Behavior  normal.        Thought Content: Thought content normal.        Judgment: Judgment normal.     BP (!) 190/104 (BP Location: Left Arm, Patient Position: Sitting, Cuff Size: Large)   Pulse (!) 55   Temp 97.7 F (36.5 C) (Temporal)   Ht 5\' 6"  (1.676 m)   Wt (!) 302 lb (137 kg)   SpO2 99%   BMI 48.74 kg/m  Wt Readings from Last 3 Encounters:  09/10/20 (!) 302 lb (137 kg)  05/14/20 (!) 302 lb 6.4 oz (137.2 kg)  04/14/20 (!) 301 lb 3.2 oz (136.6 kg)     Health Maintenance Due  Topic Date Due  . Hepatitis C Screening  Never done  . PNEUMOCOCCAL POLYSACCHARIDE VACCINE AGE 80-64 HIGH RISK  Never done  . COVID-19 Vaccine (1) Never done  . OPHTHALMOLOGY EXAM  Never done  . PAP SMEAR-Modifier  Never done  . COLONOSCOPY (Pts 45-94yrs Insurance coverage will need to be confirmed)  Never done  . MAMMOGRAM  10/23/2017  . FOOT EXAM  12/03/2018  . URINE MICROALBUMIN  12/03/2018  . INFLUENZA VACCINE  Never done    There are no preventive care reminders to display for this patient.  Lab Results  Component Value Date   TSH 1.800 09/10/2020   Lab Results  Component Value Date   WBC 6.6 09/10/2020   HGB 12.4 09/10/2020   HCT 37.1 09/10/2020   MCV 84 09/10/2020   PLT 320 09/10/2020   Lab Results  Component Value Date   NA 141 09/10/2020   K 4.4 09/10/2020   CO2 25 09/10/2020   GLUCOSE 140 (H) 09/10/2020   BUN 10 09/10/2020   CREATININE 0.71 09/10/2020   BILITOT 0.3 09/10/2020   ALKPHOS 105 09/10/2020   AST 21 09/10/2020   ALT 19 09/10/2020   PROT 7.3 09/10/2020   ALBUMIN 4.2 09/10/2020   CALCIUM 9.1 09/10/2020   ANIONGAP 14 08/21/2017   Lab Results  Component  Value Date   CHOL 208 (H) 09/10/2020   Lab Results  Component Value Date   HDL 57 09/10/2020   Lab Results  Component Value Date   LDLCALC 106 (H) 09/10/2020   Lab Results  Component Value Date   TRIG 265 (H) 09/10/2020   Lab Results  Component Value Date   CHOLHDL 3.6 09/10/2020   Lab Results   Component Value Date   HGBA1C 7.9 (A) 09/10/2020   HGBA1C 7.9 09/10/2020   HGBA1C 7.9 (A) 09/10/2020   HGBA1C 7.9 (A) 09/10/2020    Assessment & Plan:   1. Annual physical exam Physical assessment within normal for age. Basic Neurology assessment normal.  Follow-up for scheduled mammogram as needed.  Recommend monthly self breast exam Recommend daily multivitamin for women Recommend strength training in 150 minutes of cardiovascular exercise per week  2. Diabetes mellitus without complication ([place])  She will continue medication as prescribed, to decrease foods/beverages high in sugars and carbs and follow Heart Healthy or DASH diet. Increase physical activity to at least 30 minutes cardio exercise daily.  - POCT Urinalysis Dipstick - POCT glycosylated hemoglobin (Hb A1C)  3. Chronic gout of multiple sites, unspecified cause - allopurinol (ZYLOPRIM) 300 MG tablet; Take 1 tablet (300 mg total) by mouth daily.  Dispense: 90 tablet; Refill: 3  4. Hemoglobin A1C between 7% and 9% indicating borderline diabetic control Hgb A1c stable at 7.9 today. Monitor.   5. Hyperglycemia  6. Accelerated hypertension  7. Essential hypertension  7. Chronic bilateral low back pain with bilateral sciatica  8. Edema, peripheral  9. Breast cancer screening by mammogram - MM 3D SCREEN BREAST BILATERAL; Future  10. Healthcare maintenance - CBC with Differential - Comprehensive metabolic panel - Lipid Panel - TSH - Vitamin B12 - Vitamin D, 25-hydroxy  11. Follow up She will follow up in 6 months.  She will follow up in 1 week for blood pressure check only   Meds ordered this encounter  Medications  . allopurinol (ZYLOPRIM) 300 MG tablet    Sig: Take 1 tablet (300 mg total) by mouth daily.    Dispense:  90 tablet    Refill:  3    Orders Placed This Encounter  Procedures  . MM 3D SCREEN BREAST BILATERAL  . CBC with Differential  . Comprehensive metabolic panel  . Lipid Panel   . TSH  . Vitamin B12  . Vitamin D, 25-hydroxy  . POCT Urinalysis Dipstick  . POCT glycosylated hemoglobin (Hb A1C)   Referral Orders  No referral(s) requested today    Kathe Becton, MSN, ANE, FNP-BC [place], [place] [address] [phone] fax   Problem List Items Addressed This Visit      Endocrine   Diabetes mellitus without complication ([place])   Relevant Orders   POCT Urinalysis Dipstick   POCT glycosylated hemoglobin (Hb A1C) (Completed)   Hemoglobin A1C between 7% and 9% indicating borderline diabetic control     Other   Edema, peripheral   Gout   Relevant Medications   allopurinol (ZYLOPRIM) 300 MG tablet    Other Visit Diagnoses    Annual physical exam    -  Primary   Hyperglycemia       Essential hypertension       Chronic bilateral low back pain with bilateral sciatica       Breast cancer screening by mammogram  Relevant Orders   MM 3D [place] maintenance       Relevant Orders   CBC with Differential (Completed)   Comprehensive metabolic panel (Completed)   Lipid Panel (Completed)   TSH (Completed)   Vitamin B12 (Completed)   Vitamin D, 25-hydroxy (Completed)   Follow up          Meds ordered this encounter  Medications  . allopurinol (ZYLOPRIM) 300 MG tablet    Sig: Take 1 tablet (300 mg total) by mouth daily.    Dispense:  90 tablet    Refill:  3    Follow-up: No follow-ups on file.    Azzie Glatter, FNP

## 2020-09-11 NOTE — Addendum Note (Signed)
Addended by: Tyrone Apple on: 09/11/2020 02:28 PM   Modules accepted: Orders

## 2020-09-17 ENCOUNTER — Other Ambulatory Visit: Payer: Self-pay | Admitting: Family Medicine

## 2020-09-17 DIAGNOSIS — E785 Hyperlipidemia, unspecified: Secondary | ICD-10-CM

## 2020-09-17 MED ORDER — ROSUVASTATIN CALCIUM 20 MG PO TABS: 20.0000 mg | ORAL_TABLET | Freq: Every day | ORAL | 3 refills | Status: DC

## 2020-09-24 ENCOUNTER — Other Ambulatory Visit: Payer: Self-pay | Admitting: Family Medicine

## 2020-09-24 ENCOUNTER — Other Ambulatory Visit: Payer: Self-pay

## 2020-09-24 ENCOUNTER — Ambulatory Visit: Payer: Self-pay | Admitting: Family Medicine

## 2020-09-24 VITALS — BP 150/90

## 2020-09-24 DIAGNOSIS — E785 Hyperlipidemia, unspecified: Secondary | ICD-10-CM

## 2020-09-24 DIAGNOSIS — I10 Essential (primary) hypertension: Secondary | ICD-10-CM

## 2020-09-24 MED ORDER — ROSUVASTATIN CALCIUM 40 MG PO TABS: 40.0000 mg | ORAL_TABLET | Freq: Every day | ORAL | 3 refills | Status: DC

## 2020-10-15 ENCOUNTER — Telehealth: Payer: Self-pay

## 2020-10-15 NOTE — Telephone Encounter (Signed)
anitbotic for abscess (Face) [id number]  [address]

## 2020-10-16 ENCOUNTER — Telehealth: Payer: Self-pay

## 2020-10-16 NOTE — Telephone Encounter (Signed)
Pt still waiting Antibiotic for the abscess

## 2020-10-17 ENCOUNTER — Ambulatory Visit
Admission: EM | Admit: 2020-10-17 | Discharge: 2020-10-17 | Disposition: A | Discharge status: Home / Self Care | Payer: Self-pay | Attending: Emergency Medicine | Admitting: Emergency Medicine

## 2020-10-17 ENCOUNTER — Other Ambulatory Visit: Payer: Self-pay

## 2020-10-17 DIAGNOSIS — K047 Periapical abscess without sinus: Secondary | ICD-10-CM

## 2020-10-17 DIAGNOSIS — I10 Essential (primary) hypertension: Secondary | ICD-10-CM

## 2020-10-17 DIAGNOSIS — R220 Localized swelling, mass and lump, head: Secondary | ICD-10-CM

## 2020-10-17 MED ORDER — AMOXICILLIN-POT CLAVULANATE 875-125 MG PO TABS: 1.0000 | ORAL_TABLET | Freq: Two times a day (BID) | ORAL | 0 refills | Status: DC

## 2020-10-17 NOTE — ED Triage Notes (Signed)
Pt presents with abscessed tooth x 3 days. Right cheek is swollen up to under her right eye.

## 2020-10-17 NOTE — Discharge Instructions (Addendum)
Call 911 and go to the emergency department if you have difficulty swallowing or breathing.  Go to the emergency department if you have increased swelling or other concerning symptoms.    Take the antibiotic as directed.  Follow-up with your dentist.    Your blood pressure is elevated today at 195/120; rechecked at 168/92.  Please have this rechecked by your primary care provider next week.

## 2020-10-17 NOTE — Telephone Encounter (Signed)
Was this some that should have been sent in , please advise

## 2020-10-17 NOTE — ED Provider Notes (Signed)
[facility]    CSN: [id number] Arrival date & time: 10/17/20  1716      History   Chief Complaint Chief Complaint  Patient presents with  . Abscess    HPI Nancy Pham is a 55 y.o. female.   Patient presents with 3-day history of facial swelling and dental pain.  She attributes her symptoms to an abscessed tooth.  No difficulty swallowing or breathing.  She denies fever, chills, or other symptoms.  Her medical history includes hypertension, diabetes, asthma, gout, cancer.  The history is provided by the patient and medical records.    Past Medical History:  Diagnosis Date  . Asthma   . Cancer ([place])   . Diabetes mellitus   . Gout   . Hyperlipidemia   . Hypertension     Patient Active Problem List   Diagnosis Date Noted  . Hemoglobin A1C between 7% and 9% indicating borderline diabetic control 02/14/2019  . 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 Rash 02/14/2019  . Itching 02/14/2019  . Enlargement of lymph node 02/14/2019  . Edema, peripheral 02/14/2019  . Bilateral low back pain without sciatica 02/14/2019  . Chest pain 08/21/2017  . GERD (gastroesophageal reflux disease) 08/21/2017  . Gout 08/21/2017  . Diabetes mellitus without complication ([place]) 03/08/4817  . Hypertension   . Hyperlipidemia   . Asthma   . SOB (shortness of breath) 03/27/2017  . Anaphylaxis 03/20/2017    Past Surgical History:  Procedure Laterality Date  . ABDOMINAL HYSTERECTOMY      OB History    Gravida  2   Para      Term      Preterm      AB      Living  1     SAB      IAB      Ectopic      Multiple      Live Births               Home Medications    Prior to Admission medications   Medication Sig Start Date End Date Taking? Authorizing Provider  amoxicillin-clavulanate (AUGMENTIN) 875-125 MG tablet Take 1 tablet by mouth every 12 (twelve) hours. 10/17/20  Yes Sharion Balloon, NP   acetaminophen (TYLENOL) 325 MG tablet Take 325-650 mg by mouth every 6 (six) hours as needed for mild pain, fever or headache.     [provider]  albuterol (VENTOLIN HFA) 108 (90 Base) MCG/ACT inhaler Inhale 1-2 puffs into the lungs every 6 (six) hours as needed for wheezing or shortness of breath. Please dispense 3 months supple. 04/16/20   Azzie Glatter, FNP  allopurinol (ZYLOPRIM) 300 MG tablet Take 1 tablet (300 mg total) by mouth daily. 09/10/20   Azzie Glatter, FNP  aspirin EC 81 MG tablet Take 1 tablet (81 mg total) by mouth daily. 08/15/19   Azzie Glatter, FNP  Cholecalciferol (VITAMIN D3) 125 MCG (5000 UT) CAPS Take 1 capsule (5,000 Units total) by mouth daily. 02/12/19   Azzie Glatter, FNP  cyclobenzaprine (FLEXERIL) 10 MG tablet TAKE ONE TABLET BY MOUTH THREE TIMES DAILY AS NEEDED FOR MUSCLE SPASMS. 04/16/20   Azzie Glatter, FNP  EPINEPHrine 0.3 mg/0.3 mL IJ SOAJ injection Inject 0.3 mLs (0.3 mg total) into the muscle as needed (acute resp failure/worsening anaphylaxis). Patient not taking: Reported on 09/10/2020 08/15/19   Azzie Glatter, FNP  famotidine (PEPCID) 20 MG tablet Take  1 tablet (20 mg total) by mouth 2 (two) times daily. 04/16/20   Azzie Glatter, FNP  ferrous sulfate 325 (65 FE) MG tablet Take 1 tablet (325 mg total) by mouth daily with breakfast. 04/16/20   Azzie Glatter, FNP  fluticasone [facility]) 50 MCG/ACT nasal spray Place 2 sprays into both nostrils daily as needed for allergies or rhinitis. 04/16/20   Azzie Glatter, FNP  furosemide (LASIX) 40 MG tablet Take 1 tablet (40 mg total) by mouth 2 (two) times daily as needed for edema. 04/16/20   Azzie Glatter, FNP  gabapentin (NEURONTIN) 100 MG capsule Take 1 capsule (100 mg total) by mouth 3 (three) times daily. 04/16/20   Azzie Glatter, FNP  glimepiride (AMARYL) 2 MG tablet Take 1 tablet (2 mg total) by mouth daily with breakfast. 04/16/20   Azzie Glatter, FNP  hydrocortisone  cream (HYDROCORTISONE ANTI-ITCH) 1 % Apply 1 application topically 2 (two) times daily. 04/16/20   Azzie Glatter, FNP  meloxicam (MOBIC) 15 MG tablet TAKE ONE TABLET BY MOUTH EVERY DAY AS NEEDED FOR PAIN 04/16/20   Azzie Glatter, FNP  montelukast (SINGULAIR) 10 MG tablet Take 1 tablet (10 mg total) by mouth at bedtime. 04/16/20   Azzie Glatter, FNP  Multiple Vitamins-Minerals (CENTRUM SILVER 50+WOMEN) TABS Take 1 tablet by mouth daily. 08/15/19   Azzie Glatter, FNP  NIFEdipine (PROCARDIA XL/NIFEDICAL-XL) 90 MG 24 hr tablet Take 1 tablet (90 mg total) by mouth daily. Patient not taking: Reported on 09/10/2020 04/14/20   Azzie Glatter, FNP  Probiotic Product (PROBIOTIC DAILY PO) Take 1 capsule by mouth daily.    [provider]  rosuvastatin (CRESTOR) 40 MG tablet Take 1 tablet (40 mg total) by mouth daily. 09/24/20   Azzie Glatter, FNP  triamcinolone cream (KENALOG) 0.1 % Apply 1 application topically 2 (two) times daily. 08/15/19   Azzie Glatter, FNP  Liraglutide (VICTOZA) 18 MG/3ML SOLN injection Inject 1.8 mLs (10.8 mg total) into the skin daily. 03/21/17 09/14/19  Demetrios Loll, MD    Family History Family History  Problem Relation Age of Onset  . Seizures Mother   . Heart attack Father   . Heart attack Sister   . Hypertension Sister   . Hypertension Brother   . Hypertension Brother   . Hypertension Brother   . Hypertension Brother   . Hypertension Sister     Social History Social History   Tobacco Use  . Smoking status: Never Smoker  . Smokeless tobacco: Never Used  Vaping Use  . Vaping Use: Never used  Substance Use Topics  . Alcohol use: No  . Drug use: No     Allergies   Ace inhibitors and Tape   Review of Systems Review of Systems  Constitutional: Negative for chills and fever.  HENT: Positive for dental problem and facial swelling. Negative for ear pain and sore throat.   Eyes: Negative for pain and visual disturbance.  Respiratory:  Negative for cough and shortness of breath.   Cardiovascular: Negative for chest pain and palpitations.  Gastrointestinal: Negative for abdominal pain and vomiting.  Genitourinary: Negative for dysuria and hematuria.  Musculoskeletal: Negative for arthralgias and back pain.  Skin: Negative for color change and rash.  Neurological: Negative for seizures and syncope.  All other systems reviewed and are negative.    Physical Exam Triage Vital Signs ED Triage Vitals  Enc Vitals Group     BP  Pulse      Resp      Temp      Temp src      SpO2      Weight      Height      Head Circumference      Peak Flow      Pain Score      Pain Loc      Pain Edu?      Excl. in [place]?    No data found.  Updated Vital Signs BP (!) 168/92   Pulse 67   Temp 98.3 F (36.8 C)   Resp 19   SpO2 94%   Visual Acuity Right Eye Distance:   Left Eye Distance:   Bilateral Distance:    Right Eye Near:   Left Eye Near:    Bilateral Near:     Physical Exam Vitals and nursing note reviewed.  Constitutional:      General: She is not in acute distress.    Appearance: She is well-developed. She is obese.  HENT:     Head: Normocephalic and atraumatic.     Mouth/Throat:     Mouth: Mucous membranes are moist.     Pharynx: Oropharynx is clear.     Comments: Speech clear.  No difficulty swallowing.  Moderate facial swelling on right cheek. Eyes:     Conjunctiva/sclera: Conjunctivae normal.  Cardiovascular:     Rate and Rhythm: Normal rate and regular rhythm.     Heart sounds: Normal heart sounds.  Pulmonary:     Effort: Pulmonary effort is normal. No respiratory distress.     Breath sounds: Normal breath sounds.  Abdominal:     Palpations: Abdomen is soft.     Tenderness: There is no abdominal tenderness.  Musculoskeletal:     Cervical back: Neck supple.  Skin:    General: Skin is warm and dry.     Findings: No erythema or rash.  Neurological:     General: No focal deficit present.      Mental Status: She is alert and oriented to person, place, and time.     Gait: Gait normal.  Psychiatric:        Mood and Affect: Mood normal.        Behavior: Behavior normal.        UC Treatments / Results  Labs (all labs ordered are listed, but only abnormal results are displayed) Labs Reviewed - No data to display  EKG   Radiology No results found.  Procedures Procedures (including critical care time)  Medications Ordered in UC Medications - No data to display  Initial Impression / Assessment and Plan / UC Course  I have reviewed the triage vital signs and the nursing notes.  Pertinent labs & imaging results that were available during my care of the patient were reviewed by me and considered in my medical decision making (see chart for details).   Facial swelling, dental abscess.  Elevated blood pressure reading with known hypertension.  Patient declines transfer to the ED at this time.  Strict ED precautions discussed at length.  Treating with Augmentin.  Dental resource guide provided and patient instructed to follow-up with her dentist.  Discussed with her that her blood pressure is high today and needs to be rechecked by her PCP next week.  Patient agrees to plan of care.   Final Clinical Impressions(s) / UC Diagnoses   Final diagnoses:  Facial swelling  Dental abscess  Elevated blood pressure  reading in office with diagnosis of hypertension     Discharge Instructions     Call 911 and go to the emergency department if you have difficulty swallowing or breathing.  Go to the emergency department if you have increased swelling or other concerning symptoms.    Take the antibiotic as directed.  Follow-up with your dentist.    Your blood pressure is elevated today at 195/120; rechecked at 168/92.  Please have this rechecked by your primary care provider next week.           ED Prescriptions    Medication Sig Dispense Auth. Provider    amoxicillin-clavulanate (AUGMENTIN) 875-125 MG tablet Take 1 tablet by mouth every 12 (twelve) hours. 14 tablet Sharion Balloon, NP     PDMP not reviewed this encounter.   Sharion Balloon, NP 10/17/20 [phone]

## 2020-10-20 NOTE — Telephone Encounter (Signed)
Attempted to reach patient no answer or voicemail.

## 2021-03-11 ENCOUNTER — Other Ambulatory Visit: Payer: Self-pay

## 2021-03-11 ENCOUNTER — Ambulatory Visit: Payer: Self-pay | Admitting: Family Medicine

## 2021-03-11 ENCOUNTER — Encounter: Payer: Self-pay | Admitting: Nurse Practitioner

## 2021-03-11 ENCOUNTER — Ambulatory Visit (INDEPENDENT_AMBULATORY_CARE_PROVIDER_SITE_OTHER): Payer: Self-pay | Admitting: Nurse Practitioner

## 2021-03-11 VITALS — BP 180/92 | HR 65 | Temp 97.4°F | Ht 66.0 in | Wt 307.0 lb

## 2021-03-11 DIAGNOSIS — I10 Essential (primary) hypertension: Secondary | ICD-10-CM

## 2021-03-11 DIAGNOSIS — R0602 Shortness of breath: Secondary | ICD-10-CM

## 2021-03-11 DIAGNOSIS — M5441 Lumbago with sciatica, right side: Secondary | ICD-10-CM

## 2021-03-11 DIAGNOSIS — M545 Low back pain, unspecified: Secondary | ICD-10-CM

## 2021-03-11 DIAGNOSIS — R609 Edema, unspecified: Secondary | ICD-10-CM

## 2021-03-11 DIAGNOSIS — M1A09X0 Idiopathic chronic gout, multiple sites, without tophus (tophi): Secondary | ICD-10-CM

## 2021-03-11 DIAGNOSIS — E785 Hyperlipidemia, unspecified: Secondary | ICD-10-CM

## 2021-03-11 DIAGNOSIS — M5442 Lumbago with sciatica, left side: Secondary | ICD-10-CM

## 2021-03-11 DIAGNOSIS — E119 Type 2 diabetes mellitus without complications: Secondary | ICD-10-CM

## 2021-03-11 DIAGNOSIS — Z5989 Other problems related to housing and economic circumstances: Secondary | ICD-10-CM

## 2021-03-11 DIAGNOSIS — G8929 Other chronic pain: Secondary | ICD-10-CM

## 2021-03-11 LAB — POCT URINALYSIS DIPSTICK
Bilirubin, UA: NEGATIVE
Blood, UA: NEGATIVE
Glucose, UA: NEGATIVE
Ketones, UA: NEGATIVE
Leukocytes, UA: NEGATIVE
Nitrite, UA: NEGATIVE
Protein, UA: POSITIVE — AB
Spec Grav, UA: >=1.03 — AB (ref 1.010–1.025)
Urobilinogen, UA: 0.2 E.U./dL
pH, UA: 7.5 (ref 5.0–8.0)

## 2021-03-11 LAB — POCT GLYCOSYLATED HEMOGLOBIN (HGB A1C): Hemoglobin A1C: 7.7 % — AB (ref 4.0–5.6)

## 2021-03-11 MED ORDER — MONTELUKAST SODIUM 10 MG PO TABS: 10.0000 mg | ORAL_TABLET | Freq: Every day | ORAL | 3 refills | Status: DC

## 2021-03-11 MED ORDER — GLIMEPIRIDE 2 MG PO TABS: 2.0000 mg | ORAL_TABLET | Freq: Every day | ORAL | 3 refills | Status: DC

## 2021-03-11 MED ORDER — MELOXICAM 15 MG PO TABS: ORAL_TABLET | ORAL | 2 refills | Status: DC

## 2021-03-11 MED ORDER — CLONIDINE HCL 0.1 MG PO TABS
0.1000 mg | ORAL_TABLET | Freq: Once | ORAL | Status: AC
  Administered 2021-03-11: 0.1 mg via ORAL

## 2021-03-11 MED ORDER — FAMOTIDINE 20 MG PO TABS: 20.0000 mg | ORAL_TABLET | Freq: Every day | ORAL | 3 refills | Status: AC

## 2021-03-11 MED ORDER — ATENOLOL 100 MG PO TABS: 100.0000 mg | ORAL_TABLET | Freq: Every day | ORAL | 3 refills | Status: DC

## 2021-03-11 MED ORDER — ALLOPURINOL 300 MG PO TABS: 300.0000 mg | ORAL_TABLET | Freq: Every day | ORAL | 3 refills | Status: DC

## 2021-03-11 MED ORDER — GABAPENTIN 100 MG PO CAPS: 100.0000 mg | ORAL_CAPSULE | Freq: Three times a day (TID) | ORAL | 3 refills | Status: DC

## 2021-03-11 MED ORDER — FUROSEMIDE 40 MG PO TABS: 40.0000 mg | ORAL_TABLET | Freq: Two times a day (BID) | ORAL | 2 refills | Status: DC | PRN

## 2021-03-11 MED ORDER — ROSUVASTATIN CALCIUM 40 MG PO TABS: 40.0000 mg | ORAL_TABLET | Freq: Every day | ORAL | 3 refills | Status: DC

## 2021-03-11 NOTE — Progress Notes (Signed)
[place], [place]  [address] Phone:  [phone]   Fax:  [phone]   Established Patient Office Visit  Subjective:  Patient ID: Nancy Pham, female    DOB: 03/08/1966  Age: 55 y.o. MRN: [id number]  CC:  Chief Complaint  Patient presents with   Follow-up    No questions or concerns.     HPI Nancy Pham presents for follow up. A former patient of NP [place].  She  has a past medical history of Asthma, Cancer ([place]), Diabetes mellitus, Gout, Hyperlipidemia, and Hypertension.   Hypertension Patient is here for follow-up of elevated blood pressure. She is not exercising and is not adherent to a low-salt diet.. Cardiac symptoms: none. Patient denies chest pain, chest pressure/discomfort, claudication, dyspnea, exertional chest pressure/discomfort, fatigue, near-syncope, orthopnea, palpitations, paroxysmal nocturnal dyspnea, syncope, and tachypnea. Cardiovascular risk factors: diabetes mellitus, dyslipidemia, hypertension, obesity (BMI >= 30 kg/m2), and sedentary lifestyle. Use of agents associated with hypertension: none. History of target organ damage: none. She was documented as being prescribed nifedipine.  She reports this was supposed to have been changed to atenolol.  She is currently taking atenolol 100 mg daily.  She reports this is due to her sister having extra and due to her lack of insurance this allows her to have access to medications.  She has declined using [facility] due to past issues.  She shops around to get the best price for her medications.  Diabetes Mellitus Patient presents for follow up of diabetes. Current symptoms include: paresthesia of the feet. Symptoms have stabilized. Patient denies foot ulcerations. Evaluation to date has included: hemoglobin A1C.  Home sugars: patient does not check sugars. Current treatment: Continued sulfonylurea which has been somewhat effective and Continued statin which has been somewhat effective.    Past Medical History:  Diagnosis Date   Asthma    Cancer ([place])    Diabetes mellitus    Gout    Hyperlipidemia    Hypertension     Past Surgical History:  Procedure Laterality Date   ABDOMINAL HYSTERECTOMY      Family History  Problem Relation Age of Onset   Seizures Mother    Heart attack Father    Heart attack Sister    Hypertension Sister    Hypertension Brother    Hypertension Brother    Hypertension Brother    Hypertension Brother    Hypertension Sister     Social History   Socioeconomic History   Marital status: Single    Spouse name: Not on file   Number of children: Not on file   Years of education: Not on file   Highest education level: Not on file  Occupational History   Not on file  Tobacco Use   Smoking status: Never   Smokeless tobacco: Never  Vaping Use   Vaping Use: Never used  Substance and Sexual Activity   Alcohol use: No   Drug use: No   Sexual activity: Not Currently  Other Topics Concern   Not on file  Social History Narrative   Not on file   Social Determinants of Health   Financial Resource Strain: Not on file  Food Insecurity: Not on file  Transportation Needs: Not on file  Physical Activity: Not on file  Stress: Not on file  Social Connections: Not on file  Intimate Partner Violence: Not on file    Outpatient Medications Prior to Visit  Medication Sig Dispense Refill   acetaminophen (  TYLENOL) 325 MG tablet Take 325-650 mg by mouth every 6 (six) hours as needed for mild pain, fever or headache.      albuterol (VENTOLIN HFA) 108 (90 Base) MCG/ACT inhaler Inhale 1-2 puffs into the lungs every 6 (six) hours as needed for wheezing or shortness of breath. Please dispense 3 months supple. 8 g 11   aspirin EC 81 MG tablet Take 1 tablet (81 mg total) by mouth daily. 90 tablet 1   Cholecalciferol (VITAMIN D3) 125 MCG (5000 UT) CAPS Take 1 capsule (5,000 Units total) by mouth daily. 15 capsule 1   cyclobenzaprine (FLEXERIL) 10 MG  tablet TAKE ONE TABLET BY MOUTH THREE TIMES DAILY AS NEEDED FOR MUSCLE SPASMS. 90 tablet 2   ferrous sulfate 325 (65 FE) MG tablet Take 1 tablet (325 mg total) by mouth daily with breakfast. 90 tablet 3   fluticasone (FLONASE) 50 MCG/ACT nasal spray Place 2 sprays into both nostrils daily as needed for allergies or rhinitis. 11.1 mL 11   hydrocortisone cream (HYDROCORTISONE ANTI-ITCH) 1 % Apply 1 application topically 2 (two) times daily. 30 g 3   Multiple Vitamins-Minerals (CENTRUM SILVER 50+WOMEN) TABS Take 1 tablet by mouth daily. 90 tablet 1   Probiotic Product (PROBIOTIC DAILY PO) Take 1 capsule by mouth daily.     triamcinolone cream (KENALOG) 0.1 % Apply 1 application topically 2 (two) times daily. 30 g 6   allopurinol (ZYLOPRIM) 300 MG tablet Take 1 tablet (300 mg total) by mouth daily. 90 tablet 3   famotidine (PEPCID) 20 MG tablet Take 1 tablet (20 mg total) by mouth 2 (two) times daily. 180 tablet 3   furosemide (LASIX) 40 MG tablet Take 1 tablet (40 mg total) by mouth 2 (two) times daily as needed for edema. 120 tablet 2   gabapentin (NEURONTIN) 100 MG capsule Take 1 capsule (100 mg total) by mouth 3 (three) times daily. 270 capsule 3   glimepiride (AMARYL) 2 MG tablet Take 1 tablet (2 mg total) by mouth daily with breakfast. 90 tablet 3   meloxicam (MOBIC) 15 MG tablet TAKE ONE TABLET BY MOUTH EVERY DAY AS NEEDED FOR PAIN 60 tablet 2   montelukast (SINGULAIR) 10 MG tablet Take 1 tablet (10 mg total) by mouth at bedtime. 90 tablet 3   rosuvastatin (CRESTOR) 40 MG tablet Take 1 tablet (40 mg total) by mouth daily. 90 tablet 3   amoxicillin-clavulanate (AUGMENTIN) 875-125 MG tablet Take 1 tablet by mouth every 12 (twelve) hours. 14 tablet 0   EPINEPHrine 0.3 mg/0.3 mL IJ SOAJ injection Inject 0.3 mLs (0.3 mg total) into the muscle as needed (acute resp failure/worsening anaphylaxis). (Patient not taking: Reported on 09/10/2020) 3 each 3   NIFEdipine (PROCARDIA XL/NIFEDICAL-XL) 90 MG 24 hr  tablet Take 1 tablet (90 mg total) by mouth daily. (Patient not taking: Reported on 09/10/2020) 90 tablet 1   No facility-administered medications prior to visit.    Allergies  Allergen Reactions   Ace Inhibitors Swelling    Swelling of the lips   Tape Rash    No Band-Aids!!    ROS Review of Systems    Objective:    Physical Exam Constitutional:      Appearance: She is obese.  HENT:     Head: Normocephalic and atraumatic.  Cardiovascular:     Rate and Rhythm: Normal rate and regular rhythm.     Pulses: Normal pulses.     Heart sounds: Normal heart sounds.  Pulmonary:  Effort: Pulmonary effort is normal.     Breath sounds: Normal breath sounds.  Abdominal:     Palpations: Abdomen is soft.  Musculoskeletal:        General: Normal range of motion.     Cervical back: Normal range of motion.     Right lower leg: No edema.     Left lower leg: No edema.  Skin:    General: Skin is warm and dry.     Capillary Refill: Capillary refill takes less than 2 seconds.  Neurological:     General: No focal deficit present.     Mental Status: She is alert and oriented to person, place, and time.  Psychiatric:        Mood and Affect: Mood normal.        Behavior: Behavior normal.        Thought Content: Thought content normal.        Judgment: Judgment normal.    BP (!) 180/92   Pulse 65   Temp (!) 97.4 F (36.3 C)   Ht '5\' 6"'$  (1.676 m)   Wt (!) 307 lb 0.4 oz (139.3 kg)   SpO2 99%   BMI 49.56 kg/m  Wt Readings from Last 3 Encounters:  03/11/21 (!) 307 lb 0.4 oz (139.3 kg)  09/10/20 (!) 302 lb (137 kg)  05/14/20 (!) 302 lb 6.4 oz (137.2 kg)     Health Maintenance Due  Topic Date Due   PNEUMOCOCCAL POLYSACCHARIDE VACCINE AGE 26-64 HIGH RISK  Never done   COVID-19 Vaccine (1) Never done   Pneumococcal Vaccine 79-25 Years old (1 - PCV) Never done   OPHTHALMOLOGY EXAM  Never done   Hepatitis C Screening  Never done   Zoster Vaccines- Shingrix (1 of 2) Never done   PAP  SMEAR-Modifier  Never done   COLONOSCOPY (Pts 45-68yr Insurance coverage will need to be confirmed)  Never done   MAMMOGRAM  10/23/2017   FOOT EXAM  12/03/2018   URINE MICROALBUMIN  12/03/2018   INFLUENZA VACCINE  02/23/2021    There are no preventive care reminders to display for this patient.  Lab Results  Component Value Date   TSH 1.800 09/10/2020   Lab Results  Component Value Date   WBC 6.6 09/10/2020   HGB 12.4 09/10/2020   HCT 37.1 09/10/2020   MCV 84 09/10/2020   PLT 320 09/10/2020   Lab Results  Component Value Date   NA 141 09/10/2020   K 4.4 09/10/2020   CO2 25 09/10/2020   GLUCOSE 140 (H) 09/10/2020   BUN 10 09/10/2020   CREATININE 0.71 09/10/2020   BILITOT 0.3 09/10/2020   ALKPHOS 105 09/10/2020   AST 21 09/10/2020   ALT 19 09/10/2020   PROT 7.3 09/10/2020   ALBUMIN 4.2 09/10/2020   CALCIUM 9.1 09/10/2020   ANIONGAP 14 08/21/2017   Lab Results  Component Value Date   CHOL 208 (H) 09/10/2020   Lab Results  Component Value Date   HDL 57 09/10/2020   Lab Results  Component Value Date   LDLCALC 106 (H) 09/10/2020   Lab Results  Component Value Date   TRIG 265 (H) 09/10/2020   Lab Results  Component Value Date   CHOLHDL 3.6 09/10/2020   Lab Results  Component Value Date   HGBA1C 7.7 (A) 03/11/2021      Assessment & Plan:   Problem List Items Addressed This Visit       Endocrine   Diabetes mellitus without  complication (HCC) Encourage compliance with current treatment regimen   Encourage regular CBG monitoring Encourage contacting office if excessive hyperglycemia and or hypoglycemia Lifestyle modification with healthy diet (fewer calories, more high fiber foods, whole grains and non-starchy vegetables, lower fat meat and fish, low-fat diary include healthy oils) regular exercise (physical activity) and weight loss Opthalmology exam discussed  Nutritional consult recommended Regular dental visits encouraged Home BP monitoring  also encouraged goal <130/80  Stable A1c 7.9%   Relevant Orders   Urinalysis Dipstick   HgB A1c     Other   Hyperlipidemia Healthy diet Exercise    Gout    Edema, peripheral Encourage compliance with current treatment plan Work up to evaluate for Heart failure, hepatic and renal disease, venous insufficiency  Avoid sitting or staying in one position for long periods of time. Encourage movement Avoid eating too much salt Avoid NSAIDs Recommend elevation above heart level several times and day  Proper fitting compression socks or stocking Skin protection: keep skin clean, moisturized and free of injury    Other Visit Diagnoses     Essential hypertension    -  Primary   Relevant Medications   cloNIDine (CATAPRES) tablet 0.1 mg (Completed)   Other Relevant Orders   Urinalysis Dipstick   Chronic bilateral low back pain with bilateral sciatica       Shortness of breath       Does not have health insurance Discussion with patient related to medication.  She continues community health and declined additional prescriptions to be filled.  She reports that she is working on Consulting civil engineer ordered this encounter  Medications   allopurinol (ZYLOPRIM) 300 MG tablet    Sig: Take 1 tablet (300 mg total) by mouth daily.    Dispense:  90 tablet    Refill:  3   famotidine (PEPCID) 20 MG tablet    Sig: Take 1 tablet (20 mg total) by mouth daily.    Dispense:  90 tablet    Refill:  3   furosemide (LASIX) 40 MG tablet    Sig: Take 1 tablet (40 mg total) by mouth 2 (two) times daily as needed for edema.    Dispense:  120 tablet    Refill:  2   gabapentin (NEURONTIN) 100 MG capsule    Sig: Take 1 capsule (100 mg total) by mouth 3 (three) times daily.    Dispense:  270 capsule    Refill:  3   glimepiride (AMARYL) 2 MG tablet    Sig: Take 1 tablet (2 mg total) by mouth daily with breakfast.    Dispense:  90 tablet    Refill:  3   montelukast (SINGULAIR) 10 MG tablet    Sig: Take  1 tablet (10 mg total) by mouth at bedtime.    Dispense:  90 tablet    Refill:  3   rosuvastatin (CRESTOR) 40 MG tablet    Sig: Take 1 tablet (40 mg total) by mouth daily.    Dispense:  90 tablet    Refill:  3   cloNIDine (CATAPRES) tablet 0.1 mg   meloxicam (MOBIC) 15 MG tablet    Sig: TAKE ONE TABLET BY MOUTH EVERY DAY AS NEEDED FOR PAIN    Dispense:  60 tablet    Refill:  2    Order Specific Question:   Supervising Provider    Answer:   Tresa Garter [id number]   atenolol (TENORMIN) 100 MG tablet  Sig: Take 1 tablet (100 mg total) by mouth daily.    Dispense:  90 tablet    Refill:  3    Order Specific Question:   Supervising Provider    Answer:   Tresa Garter [id number]    Follow-up: Return in about 3 months (around 06/11/2021) for Follow up HTN [id number].    Vevelyn Francois, NP

## 2021-03-12 ENCOUNTER — Encounter: Payer: Self-pay | Admitting: Nurse Practitioner

## 2021-03-13 ENCOUNTER — Telehealth: Payer: Self-pay

## 2021-03-13 NOTE — Telephone Encounter (Signed)
Attempted to contact patient to verify which pharmacy she would like to use.   Number: [phone] out of service.

## 2021-06-10 ENCOUNTER — Ambulatory Visit (INDEPENDENT_AMBULATORY_CARE_PROVIDER_SITE_OTHER): Payer: Self-pay | Admitting: Nurse Practitioner

## 2021-06-10 ENCOUNTER — Encounter: Payer: Self-pay | Admitting: Nurse Practitioner

## 2021-06-10 ENCOUNTER — Other Ambulatory Visit: Payer: Self-pay

## 2021-06-10 VITALS — BP 174/75 | HR 51 | Temp 97.1°F | Ht 66.0 in | Wt 309.6 lb

## 2021-06-10 DIAGNOSIS — E785 Hyperlipidemia, unspecified: Secondary | ICD-10-CM

## 2021-06-10 DIAGNOSIS — Z1159 Encounter for screening for other viral diseases: Secondary | ICD-10-CM

## 2021-06-10 DIAGNOSIS — M5442 Lumbago with sciatica, left side: Secondary | ICD-10-CM

## 2021-06-10 DIAGNOSIS — G8929 Other chronic pain: Secondary | ICD-10-CM

## 2021-06-10 DIAGNOSIS — E119 Type 2 diabetes mellitus without complications: Secondary | ICD-10-CM

## 2021-06-10 DIAGNOSIS — M5441 Lumbago with sciatica, right side: Secondary | ICD-10-CM

## 2021-06-10 DIAGNOSIS — R0602 Shortness of breath: Secondary | ICD-10-CM

## 2021-06-10 LAB — POCT GLYCOSYLATED HEMOGLOBIN (HGB A1C)
HbA1c POC (<> result, manual entry): 8.2 % (ref 4.0–5.6)
HbA1c, POC (controlled diabetic range): 8.2 % — AB (ref 0.0–7.0)
HbA1c, POC (prediabetic range): 8.2 % — AB (ref 5.7–6.4)
Hemoglobin A1C: 8.2 % — AB (ref 4.0–5.6)

## 2021-06-10 MED ORDER — MONTELUKAST SODIUM 10 MG PO TABS: 10.0000 mg | ORAL_TABLET | Freq: Every day | ORAL | 3 refills | Status: DC

## 2021-06-10 MED ORDER — ROSUVASTATIN CALCIUM 40 MG PO TABS: 40.0000 mg | ORAL_TABLET | Freq: Every day | ORAL | 3 refills | Status: DC

## 2021-06-10 MED ORDER — CYCLOBENZAPRINE HCL 10 MG PO TABS: 10.0000 mg | ORAL_TABLET | Freq: Three times a day (TID) | ORAL | 2 refills | Status: DC | PRN

## 2021-06-10 NOTE — Patient Instructions (Signed)
Managing Your Hypertension Hypertension, also called high blood pressure, is when the force of the blood pressing against the walls of the arteries is too strong. Arteries are blood vessels that carry blood from your heart throughout your body. Hypertension forces the heart to work harder to pump blood and may cause the arteries to become narrow or stiff. Understanding blood pressure readings Your personal target blood pressure may vary depending on your medical conditions, your age, and other factors. A blood pressure reading includes a higher number over a lower number. Ideally, your blood pressure should be below 120/80. You should know that: The first, or top, number is called the systolic pressure. It is a measure of the pressure in your arteries as your heart beats. The second, or bottom number, is called the diastolic pressure. It is a measure of the pressure in your arteries as the heart relaxes. Blood pressure is classified into four stages. Based on your blood pressure reading, your health care provider may use the following stages to determine what type of treatment you need, if any. Systolic pressure and diastolic pressure are measured in a unit called mmHg. Normal Systolic pressure: below 120. Diastolic pressure: below 80. Elevated Systolic pressure: 120-129. Diastolic pressure: below 80. Hypertension stage 1 Systolic pressure: 130-139. Diastolic pressure: 80-89. Hypertension stage 2 Systolic pressure: 140 or above. Diastolic pressure: 90 or above. How can this condition affect me? Managing your hypertension is an important responsibility. Over time, hypertension can damage the arteries and decrease blood flow to important parts of the body, including the brain, heart, and kidneys. Having untreated or uncontrolled hypertension can lead to: A heart attack. A stroke. A weakened blood vessel (aneurysm). Heart failure. Kidney damage. Eye damage. Metabolic syndrome. Memory and  concentration problems. Vascular dementia. What actions can I take to manage this condition? Hypertension can be managed by making lifestyle changes and possibly by taking medicines. Your health care provider will help you make a plan to bring your blood pressure within a normal range. Nutrition  Eat a diet that is high in fiber and potassium, and low in salt (sodium), added sugar, and fat. An example eating plan is called the Dietary Approaches to Stop Hypertension (DASH) diet. To eat this way: Eat plenty of fresh fruits and vegetables. Try to fill one-half of your plate at each meal with fruits and vegetables. Eat whole grains, such as whole-wheat pasta, brown rice, or whole-grain bread. Fill about one-fourth of your plate with whole grains. Eat low-fat dairy products. Avoid fatty cuts of meat, processed or cured meats, and poultry with skin. Fill about one-fourth of your plate with lean proteins such as fish, chicken without skin, beans, eggs, and tofu. Avoid pre-made and processed foods. These tend to be higher in sodium, added sugar, and fat. Reduce your daily sodium intake. Most people with hypertension should eat less than 1,500 mg of sodium a day. Lifestyle  Work with your health care provider to maintain a healthy body weight or to lose weight. Ask what an ideal weight is for you. Get at least 30 minutes of exercise that causes your heart to beat faster (aerobic exercise) most days of the week. Activities may include walking, swimming, or biking. Include exercise to strengthen your muscles (resistance exercise), such as weight lifting, as part of your weekly exercise routine. Try to do these types of exercises for 30 minutes at least 3 days a week. Do not use any products that contain nicotine or tobacco, such as cigarettes, e-cigarettes,   and chewing tobacco. If you need help quitting, ask your health care provider. Control any long-term (chronic) conditions you have, such as high  cholesterol or diabetes. Identify your sources of stress and find ways to manage stress. This may include meditation, deep breathing, or making time for fun activities. Alcohol use Do not drink alcohol if: Your health care provider tells you not to drink. You are pregnant, may be pregnant, or are planning to become pregnant. If you drink alcohol: Limit how much you use to: 0-1 drink a day for women. 0-2 drinks a day for men. Be aware of how much alcohol is in your drink. In the [place] one drink equals one 12 oz bottle of beer (355 mL), one 5 oz glass of wine (148 mL), or one 1 oz glass of hard liquor (44 mL). Medicines Your health care provider may prescribe medicine if lifestyle changes are not enough to get your blood pressure under control and if: Your systolic blood pressure is 130 or higher. Your diastolic blood pressure is 80 or higher. Take medicines only as told by your health care provider. Follow the directions carefully. Blood pressure medicines must be taken as told by your health care provider. The medicine does not work as well when you skip doses. Skipping doses also puts you at risk for problems. Monitoring Before you monitor your blood pressure: Do not smoke, drink caffeinated beverages, or exercise within 30 minutes before taking a measurement. Use the bathroom and empty your bladder (urinate). Sit quietly for at least 5 minutes before taking measurements. Monitor your blood pressure at home as told by your health care provider. To do this: Sit with your back straight and supported. Place your feet flat on the floor. Do not cross your legs. Support your arm on a flat surface, such as a table. Make sure your upper arm is at heart level. Each time you measure, take two or three readings one minute apart and record the results. You may also need to have your blood pressure checked regularly by your health care provider. General information Talk with your health care  provider about your diet, exercise habits, and other lifestyle factors that may be contributing to hypertension. Review all the medicines you take with your health care provider because there may be side effects or interactions. Keep all visits as told by your health care provider. Your health care provider can help you create and adjust your plan for managing your high blood pressure. Where to find more information National Heart, Lung, and Blood Institute: www.nhlbi.nih.gov American Heart Association: www.heart.org Contact a health care provider if: You think you are having a reaction to medicines you have taken. You have repeated (recurrent) headaches. You feel dizzy. You have swelling in your ankles. You have trouble with your vision. Get help right away if: You develop a severe headache or confusion. You have unusual weakness or numbness, or you feel faint. You have severe pain in your chest or abdomen. You vomit repeatedly. You have trouble breathing. These symptoms may represent a serious problem that is an emergency. Do not wait to see if the symptoms will go away. Get medical help right away. Call your local emergency services (911 in the [place] Do not drive yourself to the hospital. Summary Hypertension is when the force of blood pumping through your arteries is too strong. If this condition is not controlled, it may put you at risk for serious complications. Your personal target blood pressure may vary depending on   your medical conditions, your age, and other factors. For most people, a normal blood pressure is less than 120/80. Hypertension is managed by lifestyle changes, medicines, or both. Lifestyle changes to help manage hypertension include losing weight, eating a healthy, low-sodium diet, exercising more, stopping smoking, and limiting alcohol. This information is not intended to replace advice given to you by your health care provider. Make sure you discuss any questions  you have with your health care provider. Document Revised: 07/30/2019 Document Reviewed: 06/12/2019 Elsevier Patient Education  2022 [place], Adult Shortness of breath means you have trouble breathing. Shortness of breath could be a sign of a medical problem. Follow these instructions at home: Pollution Do not smoke or use any products that contain nicotine or tobacco. If you need help quitting, ask your doctor. Avoid things that can make it harder to breathe, such as: Smoke of all kinds. This includes smoke from campfires or forest fires. Do not smoke or allow others to smoke in your home. Mold. Dust. Air pollution. Chemical smells. Things that can give you an allergic reaction (allergens) if you have allergies. Keep your living space clean. Use products that help remove mold and dust. General instructions Watch for any changes in your symptoms. Take over-the-counter and prescription medicines only as told by your doctor. This includes oxygen therapy and inhaled medicines. Rest as needed. Return to your normal activities when your doctor says that it is safe. Keep all follow-up visits. Contact a doctor if: Your condition does not get better as soon as expected. You have a hard time doing your normal activities, even after you rest. You have new symptoms. You cannot walk up stairs. You cannot exercise the way you normally do. Get help right away if: Your shortness of breath gets worse. You have trouble breathing when you are resting. You feel light-headed or you faint. You have a cough that is not helped by medicines. You cough up blood. You have pain with breathing. You have pain in your chest, arms, shoulders, or belly (abdomen). You have a fever. These symptoms may be an emergency. Get help right away. Call 911. Do not wait to see if the symptoms will go away. Do not drive yourself to the hospital. Summary Shortness of breath is when you have  trouble breathing enough air. It can be a sign of a medical problem. Avoid things that make it hard for you to breathe, such as smoking, pollution, mold, and dust. Watch for any changes in your symptoms. Contact your doctor if you do not get better or you get worse. This information is not intended to replace advice given to you by your health care provider. Make sure you discuss any questions you have with your health care provider. Document Revised: 02/28/2021 Document Reviewed: 02/28/2021 Elsevier Patient Education  [place].

## 2021-06-10 NOTE — Progress Notes (Signed)
[place], [place]  [address] Phone:  [phone]   Fax:  [phone]   Established Patient Office Visit  Subjective:  Patient ID: Nancy Pham, female    DOB: Mar 29, 1966  Age: 55 y.o. MRN: [id number]  CC:  Chief Complaint  Patient presents with   Hypertension    3 month follow up. Pt is having some shortness of breath when walking but she thinks it maybe due to her weight.    HPI Nancy Pham presents for follow up. She  has a past medical history of Asthma, Cancer ([place]), Diabetes mellitus, Gout, Hyperlipidemia, and Hypertension.   She feels like she is having increased shortness of breath. She reports that her heartrate increases also.  She correlates this with being overweight.  She states that walking from the house to the mailbox is more difficult. She denies any chest pain. She denies any new numbness or tingling. She has swelling in her legs that remains the same. She feels like it is due to her weight. "She does like to eat". She does move around.   She continues to take atenolol 100 mg for her hypertension.  For the treatment of diabetes she is currently on glimepiride 2 mg daily.  Current A1c is 8.2% elevated from 7.7% probably due to diet. Past Medical History:  Diagnosis Date   Asthma    Cancer ([place])    Diabetes mellitus    Gout    Hyperlipidemia    Hypertension     Past Surgical History:  Procedure Laterality Date   ABDOMINAL HYSTERECTOMY      Family History  Problem Relation Age of Onset   Seizures Mother    Heart attack Father    Heart attack Sister    Hypertension Sister    Hypertension Brother    Hypertension Brother    Hypertension Brother    Hypertension Brother    Hypertension Sister     Social History   Socioeconomic History   Marital status: Single    Spouse name: Not on file   Number of children: Not on file   Years of education: Not on file   Highest education level: Not on file   Occupational History   Not on file  Tobacco Use   Smoking status: Never   Smokeless tobacco: Never  Vaping Use   Vaping Use: Never used  Substance and Sexual Activity   Alcohol use: No   Drug use: No   Sexual activity: Not Currently  Other Topics Concern   Not on file  Social History Narrative   Not on file   Social Determinants of Health   Financial Resource Strain: Not on file  Food Insecurity: Not on file  Transportation Needs: Not on file  Physical Activity: Not on file  Stress: Not on file  Social Connections: Not on file  Intimate Partner Violence: Not on file    Outpatient Medications Prior to Visit  Medication Sig Dispense Refill   allopurinol (ZYLOPRIM) 300 MG tablet Take 1 tablet (300 mg total) by mouth daily. 90 tablet 3   aspirin EC 81 MG tablet Take 1 tablet (81 mg total) by mouth daily. 90 tablet 1   atenolol (TENORMIN) 100 MG tablet Take 1 tablet (100 mg total) by mouth daily. 90 tablet 3   Cholecalciferol (VITAMIN D3) 125 MCG (5000 UT) CAPS Take 1 capsule (5,000 Units total) by mouth daily. 15 capsule 1   famotidine (PEPCID) 20 MG  tablet Take 1 tablet (20 mg total) by mouth daily. 90 tablet 3   ferrous sulfate 325 (65 FE) MG tablet Take 1 tablet (325 mg total) by mouth daily with breakfast. 90 tablet 3   fluticasone (FLONASE) 50 MCG/ACT nasal spray Place 2 sprays into both nostrils daily as needed for allergies or rhinitis. 11.1 mL 11   furosemide (LASIX) 40 MG tablet Take 1 tablet (40 mg total) by mouth 2 (two) times daily as needed for edema. 120 tablet 2   gabapentin (NEURONTIN) 100 MG capsule Take 1 capsule (100 mg total) by mouth 3 (three) times daily. 270 capsule 3   glimepiride (AMARYL) 2 MG tablet Take 1 tablet (2 mg total) by mouth daily with breakfast. 90 tablet 3   hydrocortisone cream (HYDROCORTISONE ANTI-ITCH) 1 % Apply 1 application topically 2 (two) times daily. 30 g 3   meloxicam (MOBIC) 15 MG tablet TAKE ONE TABLET BY MOUTH EVERY DAY AS  NEEDED FOR PAIN 60 tablet 2   Probiotic Product (PROBIOTIC DAILY PO) Take 1 capsule by mouth daily.     triamcinolone cream (KENALOG) 0.1 % Apply 1 application topically 2 (two) times daily. 30 g 6   cyclobenzaprine (FLEXERIL) 10 MG tablet TAKE ONE TABLET BY MOUTH THREE TIMES DAILY AS NEEDED FOR MUSCLE SPASMS. 90 tablet 2   montelukast (SINGULAIR) 10 MG tablet Take 1 tablet (10 mg total) by mouth at bedtime. 90 tablet 3   Multiple Vitamins-Minerals (CENTRUM SILVER 50+WOMEN) TABS Take 1 tablet by mouth daily. 90 tablet 1   rosuvastatin (CRESTOR) 40 MG tablet Take 1 tablet (40 mg total) by mouth daily. 90 tablet 3   acetaminophen (TYLENOL) 325 MG tablet Take 325-650 mg by mouth every 6 (six) hours as needed for mild pain, fever or headache.      albuterol (VENTOLIN HFA) 108 (90 Base) MCG/ACT inhaler Inhale 1-2 puffs into the lungs every 6 (six) hours as needed for wheezing or shortness of breath. Please dispense 3 months supple. 8 g 11   No facility-administered medications prior to visit.    Allergies  Allergen Reactions   Ace Inhibitors Swelling    Swelling of the lips   Tape Rash    No Band-Aids!!    ROS Review of Systems    Objective:    Physical Exam HENT:     Head: Normocephalic and atraumatic.     Nose: Nose normal.     Mouth/Throat:     Mouth: Mucous membranes are moist.  Cardiovascular:     Rate and Rhythm: Normal rate and regular rhythm.     Pulses: Normal pulses.     Heart sounds: Normal heart sounds.  Pulmonary:     Effort: Pulmonary effort is normal.     Breath sounds: Normal breath sounds.  Abdominal:     Palpations: Abdomen is soft.  Musculoskeletal:        General: Normal range of motion.     Cervical back: Normal range of motion.     Right lower leg: No edema.     Left lower leg: No edema.  Skin:    General: Skin is warm and dry.     Capillary Refill: Capillary refill takes less than 2 seconds.  Neurological:     General: No focal deficit present.      Mental Status: She is alert and oriented to person, place, and time.  Psychiatric:        Mood and Affect: Mood normal.  Behavior: Behavior normal.        Thought Content: Thought content normal.        Judgment: Judgment normal.    BP (!) 174/75 Comment: manually  Pulse (!) 51   Temp (!) 97.1 F (36.2 C)   Ht 5\' 6"  (1.676 m)   Wt (!) 309 lb 9.6 oz (140.4 kg)   SpO2 96%   BMI 49.97 kg/m  Wt Readings from Last 3 Encounters:  06/10/21 (!) 309 lb 9.6 oz (140.4 kg)  03/11/21 (!) 307 lb 0.4 oz (139.3 kg)  09/10/20 (!) 302 lb (137 kg)     Health Maintenance Due  Topic Date Due   OPHTHALMOLOGY EXAM  Never done   PAP SMEAR-Modifier  Never done   MAMMOGRAM  10/23/2017   URINE MICROALBUMIN  12/03/2018    There are no preventive care reminders to display for this patient.  Lab Results  Component Value Date   TSH 1.800 09/10/2020   Lab Results  Component Value Date   WBC 6.6 09/10/2020   HGB 12.4 09/10/2020   HCT 37.1 09/10/2020   MCV 84 09/10/2020   PLT 320 09/10/2020   Lab Results  Component Value Date   NA 141 09/10/2020   K 4.4 09/10/2020   CO2 25 09/10/2020   GLUCOSE 140 (H) 09/10/2020   BUN 10 09/10/2020   CREATININE 0.71 09/10/2020   BILITOT 0.3 09/10/2020   ALKPHOS 105 09/10/2020   AST 21 09/10/2020   ALT 19 09/10/2020   PROT 7.3 09/10/2020   ALBUMIN 4.2 09/10/2020   CALCIUM 9.1 09/10/2020   ANIONGAP 14 08/21/2017   Lab Results  Component Value Date   CHOL 208 (H) 09/10/2020   Lab Results  Component Value Date   HDL 57 09/10/2020   Lab Results  Component Value Date   LDLCALC 106 (H) 09/10/2020   Lab Results  Component Value Date   TRIG 265 (H) 09/10/2020   Lab Results  Component Value Date   CHOLHDL 3.6 09/10/2020   Lab Results  Component Value Date   HGBA1C 8.2 (A) 06/10/2021   HGBA1C 8.2 06/10/2021   HGBA1C 8.2 (A) 06/10/2021   HGBA1C 8.2 (A) 06/10/2021      Assessment & Plan:   Problem List Items Addressed This Visit        Endocrine   Diabetes mellitus without complication ([place]) - Primary Current A1c 8.2% Encourage compliance with current treatment regimen   Encourage regular CBG monitoring Encourage contacting office if excessive hyperglycemia and or hypoglycemia Lifestyle modification with healthy diet (fewer calories, more high fiber foods, whole grains and non-starchy vegetables, lower fat meat and fish, low-fat diary include healthy oils) regular exercise (physical activity) and weight loss Opthalmology exam discussed  Nutritional consult recommended Regular dental visits encouraged Home BP monitoring also encouraged goal <130/80    Relevant Medications   rosuvastatin (CRESTOR) 40 MG tablet   Other Relevant Orders   HgB A1c (Completed)   POCT URINALYSIS DIP (CLINITEK)   Microalbumin, urine   Comp. Metabolic Panel (12)      Other   Hyperlipidemia Persistent reevaluation   Relevant Medications   rosuvastatin (CRESTOR) 40 MG tablet   Other Relevant Orders   Lipid panel    Other Visit Diagnoses     Chronic bilateral low back pain with bilateral sciatica    Persistent Patient aware this can be exacerbated by weight   Relevant Medications   cyclobenzaprine (FLEXERIL) 10 MG tablet   Shortness of breath  Patient declines cardiac work-up with referral to cardiology   Relevant Medications   montelukast (SINGULAIR) 10 MG tablet   Encounter for hepatitis C screening test for low risk patient       Relevant Orders   Hepatitis C antibody        Meds ordered this encounter  Medications   cyclobenzaprine (FLEXERIL) 10 MG tablet    Sig: Take 1 tablet (10 mg total) by mouth 3 (three) times daily as needed for muscle spasms. TAKE ONE TABLET BY MOUTH THREE TIMES DAILY AS NEEDED FOR MUSCLE SPASMS.    Dispense:  90 tablet    Refill:  2   montelukast (SINGULAIR) 10 MG tablet    Sig: Take 1 tablet (10 mg total) by mouth at bedtime.    Dispense:  90 tablet    Refill:  3   rosuvastatin  (CRESTOR) 40 MG tablet    Sig: Take 1 tablet (40 mg total) by mouth daily.    Dispense:  90 tablet    Refill:  3     Follow-up: Return in about 3 months (around 09/10/2021) for Follow up HTN [id number].    Vevelyn Francois, NP

## 2021-06-11 ENCOUNTER — Other Ambulatory Visit: Payer: Self-pay | Admitting: Nurse Practitioner

## 2021-06-11 LAB — COMP. METABOLIC PANEL (12)
AST: 25 IU/L (ref 0–40)
Albumin/Globulin Ratio: 1.5 (ref 1.2–2.2)
Albumin: 4.3 g/dL (ref 3.8–4.9)
Alkaline Phosphatase: 99 IU/L (ref 44–121)
BUN/Creatinine Ratio: 16 (ref 9–23)
BUN: 12 mg/dL (ref 6–24)
Bilirubin Total: 0.4 mg/dL (ref 0.0–1.2)
Calcium: 9.6 mg/dL (ref 8.7–10.2)
Chloride: 100 mmol/L (ref 96–106)
Creatinine, Ser: 0.77 mg/dL (ref 0.57–1.00)
Globulin, Total: 2.8 g/dL (ref 1.5–4.5)
Glucose: 158 mg/dL — ABNORMAL HIGH (ref 70–99)
Potassium: 4 mmol/L (ref 3.5–5.2)
Sodium: 142 mmol/L (ref 134–144)
Total Protein: 7.1 g/dL (ref 6.0–8.5)
eGFR: 90 mL/min/{1.73_m2} (ref >59)

## 2021-06-11 LAB — LIPID PANEL
Chol/HDL Ratio: 3.9 ratio (ref 0.0–4.4)
Cholesterol, Total: 189 mg/dL (ref 100–199)
HDL: 49 mg/dL (ref >39)
LDL Chol Calc (NIH): 98 mg/dL (ref 0–99)
Triglycerides: 247 mg/dL — ABNORMAL HIGH (ref 0–149)
VLDL Cholesterol Cal: 42 mg/dL — ABNORMAL HIGH (ref 5–40)

## 2021-06-11 LAB — HEPATITIS C ANTIBODY: Hep C Virus Ab: <0.1 s/co ratio (ref 0.0–0.9)

## 2021-06-11 LAB — MICROALBUMIN, URINE: Microalbumin, Urine: 59 ug/mL

## 2021-06-11 MED ORDER — CENTRUM SILVER 50+WOMEN PO TABS: 1.0000 | ORAL_TABLET | Freq: Every day | ORAL | 1 refills | Status: AC

## 2021-06-15 ENCOUNTER — Telehealth: Payer: Self-pay

## 2021-06-15 ENCOUNTER — Other Ambulatory Visit: Payer: Self-pay

## 2021-06-15 DIAGNOSIS — E785 Hyperlipidemia, unspecified: Secondary | ICD-10-CM

## 2021-06-15 DIAGNOSIS — R0602 Shortness of breath: Secondary | ICD-10-CM

## 2021-06-15 MED ORDER — MONTELUKAST SODIUM 10 MG PO TABS: 10.0000 mg | ORAL_TABLET | Freq: Every day | ORAL | 3 refills | Status: DC

## 2021-06-15 MED ORDER — ROSUVASTATIN CALCIUM 40 MG PO TABS: 40.0000 mg | ORAL_TABLET | Freq: Every day | ORAL | 3 refills | Status: DC

## 2021-06-15 NOTE — Telephone Encounter (Signed)
Rosuvastatin Montelukast   [facility]  [address] in [place]

## 2021-06-15 NOTE — Telephone Encounter (Signed)
Rx refills were resent to the patient pharmacy.

## 2021-06-24 ENCOUNTER — Telehealth: Payer: Self-pay

## 2021-06-24 NOTE — Telephone Encounter (Signed)
Cyclobenzaprine

## 2021-06-25 ENCOUNTER — Other Ambulatory Visit: Payer: Self-pay | Admitting: Nurse Practitioner

## 2021-06-25 DIAGNOSIS — G8929 Other chronic pain: Secondary | ICD-10-CM

## 2021-06-25 DIAGNOSIS — M5442 Lumbago with sciatica, left side: Secondary | ICD-10-CM

## 2021-09-16 ENCOUNTER — Other Ambulatory Visit: Payer: Self-pay

## 2021-09-16 ENCOUNTER — Encounter: Payer: Self-pay | Admitting: Nurse Practitioner

## 2021-09-16 ENCOUNTER — Ambulatory Visit (INDEPENDENT_AMBULATORY_CARE_PROVIDER_SITE_OTHER): Payer: Self-pay | Admitting: Nurse Practitioner

## 2021-09-16 VITALS — BP 173/92 | HR 68 | Temp 97.3°F | Ht 66.0 in | Wt 313.0 lb

## 2021-09-16 DIAGNOSIS — E785 Hyperlipidemia, unspecified: Secondary | ICD-10-CM

## 2021-09-16 DIAGNOSIS — I10 Essential (primary) hypertension: Secondary | ICD-10-CM

## 2021-09-16 DIAGNOSIS — I160 Hypertensive urgency: Secondary | ICD-10-CM

## 2021-09-16 DIAGNOSIS — E119 Type 2 diabetes mellitus without complications: Secondary | ICD-10-CM

## 2021-09-16 LAB — POCT GLYCOSYLATED HEMOGLOBIN (HGB A1C)
HbA1c POC (<> result, manual entry): 10.1 % (ref 4.0–5.6)
HbA1c, POC (controlled diabetic range): 10.1 % — AB (ref 0.0–7.0)
HbA1c, POC (prediabetic range): 10.1 % — AB (ref 5.7–6.4)
Hemoglobin A1C: 10.1 % — AB (ref 4.0–5.6)

## 2021-09-16 MED ORDER — CLONIDINE HCL 0.1 MG PO TABS: 0.2000 mg | ORAL_TABLET | Freq: Once | ORAL | Status: DC

## 2021-09-16 MED ORDER — AMLODIPINE BESYLATE 5 MG PO TABS: 5.0000 mg | ORAL_TABLET | Freq: Every day | ORAL | 2 refills | Status: DC

## 2021-09-16 MED ORDER — GLIMEPIRIDE 4 MG PO TABS: 4.0000 mg | ORAL_TABLET | Freq: Every day | ORAL | 0 refills | Status: DC

## 2021-09-16 NOTE — Progress Notes (Signed)
[place], [place]  [address] Phone:  [phone]   Fax:  [phone]   Established Patient Office Visit  Subjective:  Patient ID: Nancy Pham, female    DOB: 09/15/1965  Age: 56 y.o. MRN: [id number]  CC:  Chief Complaint  Patient presents with   Follow-up    Pt is here for 3 month follow up visit.     HPI Shahira Fiske presents for follow up. She  has a past medical history of Asthma, Cancer ([place]), Diabetes mellitus, Gout, Hyperlipidemia, and Hypertension.   Ms. Sterbenz is in today for follow up for Hypertension. The current prescribed treatment is atenolol 100 mg, furosemide 40 mg twice daily. Compliance is reported and home blood pressure monitoring is not being done. The  DASH diet is not being followed. An exercise regimen is not ongoing. There is a goal to maintain BP. She had a headache on last night. Denies headache, dizziness, visual changes, shortness of breath, dyspnea on exertion, chest pain, nausea, vomiting or any edema.   She is also in  today for diabetes follow up. The prescribed treatment is glimepiride 2 mg daily along with statin therapy rosuvastatin. The reported use of treatment is consistent with prescribed. There are reported side effects from the treatment. She is surprise that her a1C has increased. She denies a lot of sweets. She is eating more. Her weight continues to increase. She is going to start walking.  Denies fever, chills, headache, dizziness, visual changes, polydipsia,  polyphagia shortness of breath, dyspnea on exertion, chest pain, abdominal pain, nausea, vomiting, polyuria, constipation, diarrhea,  any edema, numbness, tingling, burning of hand or feet. There has been a professional eye exam in the year.      Past Medical History:  Diagnosis Date   Asthma    Cancer ([place])    Diabetes mellitus    Gout    Hyperlipidemia    Hypertension     Past Surgical History:  Procedure Laterality Date    ABDOMINAL HYSTERECTOMY      Family History  Problem Relation Age of Onset   Seizures Mother    Heart attack Father    Heart attack Sister    Hypertension Sister    Hypertension Brother    Hypertension Brother    Hypertension Brother    Hypertension Brother    Hypertension Sister     Social History   Socioeconomic History   Marital status: Single    Spouse name: Not on file   Number of children: Not on file   Years of education: Not on file   Highest education level: Not on file  Occupational History   Not on file  Tobacco Use   Smoking status: Never   Smokeless tobacco: Never  Vaping Use   Vaping Use: Never used  Substance and Sexual Activity   Alcohol use: No   Drug use: No   Sexual activity: Not Currently  Other Topics Concern   Not on file  Social History Narrative   Not on file   Social Determinants of Health   Financial Resource Strain: Not on file  Food Insecurity: Not on file  Transportation Needs: Not on file  Physical Activity: Not on file  Stress: Not on file  Social Connections: Not on file  Intimate Partner Violence: Not on file    Outpatient Medications Prior to Visit  Medication Sig Dispense Refill   acetaminophen (TYLENOL) 325 MG tablet Take 325-650 mg  by mouth every 6 (six) hours as needed for mild pain, fever or headache.      albuterol (VENTOLIN HFA) 108 (90 Base) MCG/ACT inhaler Inhale 1-2 puffs into the lungs every 6 (six) hours as needed for wheezing or shortness of breath. Please dispense 3 months supple. 8 g 11   allopurinol (ZYLOPRIM) 300 MG tablet Take 1 tablet (300 mg total) by mouth daily. 90 tablet 3   aspirin EC 81 MG tablet Take 1 tablet (81 mg total) by mouth daily. 90 tablet 1   atenolol (TENORMIN) 100 MG tablet Take 1 tablet (100 mg total) by mouth daily. 90 tablet 3   Cholecalciferol (VITAMIN D3) 125 MCG (5000 UT) CAPS Take 1 capsule (5,000 Units total) by mouth daily. 15 capsule 1   cyclobenzaprine (FLEXERIL) 10 MG tablet  Take 1 tablet (10 mg total) by mouth 3 (three) times daily as needed for muscle spasms. TAKE ONE TABLET BY MOUTH THREE TIMES DAILY AS NEEDED FOR MUSCLE SPASMS. 90 tablet 2   famotidine (PEPCID) 20 MG tablet Take 1 tablet (20 mg total) by mouth daily. 90 tablet 3   ferrous sulfate 325 (65 FE) MG tablet Take 1 tablet (325 mg total) by mouth daily with breakfast. 90 tablet 3   fluticasone (FLONASE) 50 MCG/ACT nasal spray Place 2 sprays into both nostrils daily as needed for allergies or rhinitis. 11.1 mL 11   furosemide (LASIX) 40 MG tablet Take 1 tablet (40 mg total) by mouth 2 (two) times daily as needed for edema. 120 tablet 2   gabapentin (NEURONTIN) 100 MG capsule Take 1 capsule (100 mg total) by mouth 3 (three) times daily. 270 capsule 3   meloxicam (MOBIC) 15 MG tablet TAKE ONE TABLET BY MOUTH EVERY DAY AS NEEDED FOR PAIN 60 tablet 2   montelukast (SINGULAIR) 10 MG tablet Take 1 tablet (10 mg total) by mouth at bedtime. 90 tablet 3   Multiple Vitamins-Minerals (CENTRUM SILVER 50+WOMEN) TABS Take 1 tablet by mouth daily. 90 tablet 1   Probiotic Product (PROBIOTIC DAILY PO) Take 1 capsule by mouth daily.     rosuvastatin (CRESTOR) 40 MG tablet Take 1 tablet (40 mg total) by mouth daily. 90 tablet 3   glimepiride (AMARYL) 2 MG tablet Take 1 tablet (2 mg total) by mouth daily with breakfast. 90 tablet 3   hydrocortisone cream (HYDROCORTISONE ANTI-ITCH) 1 % Apply 1 application topically 2 (two) times daily. (Patient not taking: Reported on 09/16/2021) 30 g 3   triamcinolone cream (KENALOG) 0.1 % Apply 1 application topically 2 (two) times daily. (Patient not taking: Reported on 09/16/2021) 30 g 6   No facility-administered medications prior to visit.    Allergies  Allergen Reactions   Ace Inhibitors Swelling    Swelling of the lips   Tape Rash    No Band-Aids!!    ROS Review of Systems    Objective:    Physical Exam Constitutional:      Appearance: She is obese.  HENT:     Head:  Normocephalic and atraumatic.     Nose: Nose normal.     Mouth/Throat:     Mouth: Mucous membranes are moist.  Cardiovascular:     Rate and Rhythm: Normal rate and regular rhythm.     Pulses: Normal pulses.     Heart sounds: Normal heart sounds.  Pulmonary:     Effort: Pulmonary effort is normal.     Breath sounds: Normal breath sounds.  Abdominal:     Palpations:  Abdomen is soft.  Musculoskeletal:        General: Normal range of motion.     Cervical back: Normal range of motion.     Right lower leg: No edema.     Left lower leg: No edema.  Skin:    General: Skin is warm.     Capillary Refill: Capillary refill takes less than 2 seconds.  Neurological:     General: No focal deficit present.     Mental Status: She is alert.  Psychiatric:        Mood and Affect: Mood normal.        Behavior: Behavior normal.        Thought Content: Thought content normal.        Judgment: Judgment normal.    BP (!) 173/92 (BP Location: Right Arm)    Pulse 68    Temp (!) 97.3 F (36.3 C)    Ht '5\' 6"'  (1.676 m)    Wt (!) 313 lb 0.6 oz (142 kg)    SpO2 100%    BMI 50.53 kg/m  Wt Readings from Last 3 Encounters:  09/16/21 (!) 313 lb 0.6 oz (142 kg)  06/10/21 (!) 309 lb 9.6 oz (140.4 kg)  03/11/21 (!) 307 lb 0.4 oz (139.3 kg)     Health Maintenance Due  Topic Date Due   COVID-19 Vaccine (1) Never done   OPHTHALMOLOGY EXAM  Never done   PAP SMEAR-Modifier  Never done   Zoster Vaccines- Shingrix (1 of 2) Never done   MAMMOGRAM  10/23/2017    There are no preventive care reminders to display for this patient.  Lab Results  Component Value Date   TSH 1.800 09/10/2020   Lab Results  Component Value Date   WBC 6.6 09/10/2020   HGB 12.4 09/10/2020   HCT 37.1 09/10/2020   MCV 84 09/10/2020   PLT 320 09/10/2020   Lab Results  Component Value Date   NA 142 06/10/2021   K 4.0 06/10/2021   CO2 25 09/10/2020   GLUCOSE 158 (H) 06/10/2021   BUN 12 06/10/2021   CREATININE 0.77 06/10/2021    BILITOT 0.4 06/10/2021   ALKPHOS 99 06/10/2021   AST 25 06/10/2021   ALT 19 09/10/2020   PROT 7.1 06/10/2021   ALBUMIN 4.3 06/10/2021   CALCIUM 9.6 06/10/2021   ANIONGAP 14 08/21/2017   EGFR 90 06/10/2021   Lab Results  Component Value Date   CHOL 189 06/10/2021   Lab Results  Component Value Date   HDL 49 06/10/2021   Lab Results  Component Value Date   LDLCALC 98 06/10/2021   Lab Results  Component Value Date   TRIG 247 (H) 06/10/2021   Lab Results  Component Value Date   CHOLHDL 3.9 06/10/2021   Lab Results  Component Value Date   HGBA1C 10.1 (A) 09/16/2021   HGBA1C 10.1 09/16/2021   HGBA1C 10.1 (A) 09/16/2021   HGBA1C 10.1 (A) 09/16/2021      Assessment & Plan:   Problem List Items Addressed This Visit       Endocrine   Diabetes mellitus without complication ([place]) - Primary Encourage compliance with current treatment regimen  Dose adjustment increased glimepiride 4 mg daily Encourage regular CBG monitoring Encourage contacting office if excessive hyperglycemia and or hypoglycemia Lifestyle modification with healthy diet (fewer calories, more high fiber foods, whole grains and non-starchy vegetables, lower fat meat and fish, low-fat diary include healthy oils) regular exercise (physical activity) and weight  loss Opthalmology exam discussed  Nutritional consult recommended Regular dental visits encouraged Home BP monitoring also encouraged goal <130/80    Relevant Medications   glimepiride (AMARYL) 4 MG tablet   Other Relevant Orders   POCT glycosylated hemoglobin (Hb A1C) (Completed)     Other   Hyperlipidemia Continue with current regimen.  No changes warranted. Good patient compliance.    Relevant Medications   cloNIDine (CATAPRES) tablet 0.2 mg   amLODipine (NORVASC) 5 MG tablet   Other Relevant Orders   Comp. Metabolic Panel (12) (Completed)   Other Visit Diagnoses     Essential hypertension     Uncontrolled  Increased Lisinopril 40  mg daily  Encouraged on going compliance with current medication regimen Encouraged home monitoring and recording BP <130/80 Eating a heart-healthy diet with less salt Encouraged regular physical activity  Recommend Weight loss     Relevant Medications   cloNIDine (CATAPRES) tablet 0.2 mg   amLODipine (NORVASC) 5 MG tablet   Hypertensive urgency       Relevant Medications   cloNIDine (CATAPRES) tablet 0.2 mg   amLODipine (NORVASC) 5 MG tablet       Meds ordered this encounter  Medications   cloNIDine (CATAPRES) tablet 0.2 mg   amLODipine (NORVASC) 5 MG tablet    Sig: Take 1 tablet (5 mg total) by mouth daily.    Dispense:  60 tablet    Refill:  2    Order Specific Question:   Supervising Provider    Answer:   Tresa Garter [[id number]]   glimepiride (AMARYL) 4 MG tablet    Sig: Take 1 tablet (4 mg total) by mouth daily before breakfast.    Dispense:  90 tablet    Refill:  0    Order Specific Question:   Supervising Provider    Answer:   Tresa Garter [id number]    Follow-up: Return in about 3 months (around 12/14/2021) for Follow up Dm & HTN [id number].    Vevelyn Francois, NP put in great the

## 2021-09-16 NOTE — Patient Instructions (Addendum)
Managing Your Hypertension Hypertension, also called high blood pressure, is when the force of the blood pressing against the walls of the arteries is too strong. Arteries are blood vessels that carry blood from your heart throughout your body. Hypertension forces the heart to work harder to pump blood and may cause the arteries to become narrow or stiff. Understanding blood pressure readings Your personal target blood pressure may vary depending on your medical conditions, your age, and other factors. A blood pressure reading includes a higher number over a lower number. Ideally, your blood pressure should be below 120/80. You should know that: The first, or top, number is called the systolic pressure. It is a measure of the pressure in your arteries as your heart beats. The second, or bottom number, is called the diastolic pressure. It is a measure of the pressure in your arteries as the heart relaxes. Blood pressure is classified into four stages. Based on your blood pressure reading, your health care provider may use the following stages to determine what type of treatment you need, if any. Systolic pressure and diastolic pressure are measured in a unit called mmHg. Normal Systolic pressure: below 373. Diastolic pressure: below 80. Elevated Systolic pressure: 428-768. Diastolic pressure: below 80. Hypertension stage 1 Systolic pressure: 115-726. Diastolic pressure: 20-35. Hypertension stage 2 Systolic pressure: 597 or above. Diastolic pressure: 90 or above. How can this condition affect me? Managing your hypertension is an important responsibility. Over time, hypertension can damage the arteries and decrease blood flow to important parts of the body, including the brain, heart, and kidneys. Having untreated or uncontrolled hypertension can lead to: A heart attack. A stroke. A weakened blood vessel (aneurysm). Heart failure. Kidney damage. Eye damage. Metabolic syndrome. Memory and  concentration problems. Vascular dementia. What actions can I take to manage this condition? Hypertension can be managed by making lifestyle changes and possibly by taking medicines. Your health care provider will help you make a plan to bring your blood pressure within a normal range. Nutrition  Eat a diet that is high in fiber and potassium, and low in salt (sodium), added sugar, and fat. An example eating plan is called the Dietary Approaches to Stop Hypertension (DASH) diet. To eat this way: Eat plenty of fresh fruits and vegetables. Try to fill one-half of your plate at each meal with fruits and vegetables. Eat whole grains, such as whole-wheat pasta, brown rice, or whole-grain bread. Fill about one-fourth of your plate with whole grains. Eat low-fat dairy products. Avoid fatty cuts of meat, processed or cured meats, and poultry with skin. Fill about one-fourth of your plate with lean proteins such as fish, chicken without skin, beans, eggs, and tofu. Avoid pre-made and processed foods. These tend to be higher in sodium, added sugar, and fat. Reduce your daily sodium intake. Most people with hypertension should eat less than 1,500 mg of sodium a day. Lifestyle  Work with your health care provider to maintain a healthy body weight or to lose weight. Ask what an ideal weight is for you. Get at least 30 minutes of exercise that causes your heart to beat faster (aerobic exercise) most days of the week. Activities may include walking, swimming, or biking. Include exercise to strengthen your muscles (resistance exercise), such as weight lifting, as part of your weekly exercise routine. Try to do these types of exercises for 30 minutes at least 3 days a week. Do not use any products that contain nicotine or tobacco, such as cigarettes, e-cigarettes,  and chewing tobacco. If you need help quitting, ask your health care provider. Control any long-term (chronic) conditions you have, such as high  cholesterol or diabetes. Identify your sources of stress and find ways to manage stress. This may include meditation, deep breathing, or making time for fun activities. Alcohol use Do not drink alcohol if: Your health care provider tells you not to drink. You are pregnant, may be pregnant, or are planning to become pregnant. If you drink alcohol: Limit how much you use to: 0-1 drink a day for women. 0-2 drinks a day for men. Be aware of how much alcohol is in your drink. In the [place] one drink equals one 12 oz bottle of beer (355 mL), one 5 oz glass of wine (148 mL), or one 1 oz glass of hard liquor (44 mL). Medicines Your health care provider may prescribe medicine if lifestyle changes are not enough to get your blood pressure under control and if: Your systolic blood pressure is 130 or higher. Your diastolic blood pressure is 80 or higher. Take medicines only as told by your health care provider. Follow the directions carefully. Blood pressure medicines must be taken as told by your health care provider. The medicine does not work as well when you skip doses. Skipping doses also puts you at risk for problems. Monitoring Before you monitor your blood pressure: Do not smoke, drink caffeinated beverages, or exercise within 30 minutes before taking a measurement. Use the bathroom and empty your bladder (urinate). Sit quietly for at least 5 minutes before taking measurements. Monitor your blood pressure at home as told by your health care provider. To do this: Sit with your back straight and supported. Place your feet flat on the floor. Do not cross your legs. Support your arm on a flat surface, such as a table. Make sure your upper arm is at heart level. Each time you measure, take two or three readings one minute apart and record the results. You may also need to have your blood pressure checked regularly by your health care provider. General information Talk with your health care  provider about your diet, exercise habits, and other lifestyle factors that may be contributing to hypertension. Review all the medicines you take with your health care provider because there may be side effects or interactions. Keep all visits as told by your health care provider. Your health care provider can help you create and adjust your plan for managing your high blood pressure. Where to find more information National Heart, Lung, and Blood Institute: https://wilson-eaton.com/ American Heart Association: www.heart.org Contact a health care provider if: You think you are having a reaction to medicines you have taken. You have repeated (recurrent) headaches. You feel dizzy. You have swelling in your ankles. You have trouble with your vision. Get help right away if: You develop a severe headache or confusion. You have unusual weakness or numbness, or you feel faint. You have severe pain in your chest or abdomen. You vomit repeatedly. You have trouble breathing. These symptoms may represent a serious problem that is an emergency. Do not wait to see if the symptoms will go away. Get medical help right away. Call your local emergency services (911 in the [place] Do not drive yourself to the hospital. Summary Hypertension is when the force of blood pumping through your arteries is too strong. If this condition is not controlled, it may put you at risk for serious complications. Your personal target blood pressure may vary depending on  your medical conditions, your age, and other factors. For most people, a normal blood pressure is less than 120/80. Hypertension is managed by lifestyle changes, medicines, or both. Lifestyle changes to help manage hypertension include losing weight, eating a healthy, low-sodium diet, exercising more, stopping smoking, and limiting alcohol. This information is not intended to replace advice given to you by your health care provider. Make sure you discuss any questions  you have with your health care provider. Document Revised: 07/30/2019 Document Reviewed: 06/12/2019 Elsevier Patient Education  2022 [place].  Diabetes Mellitus and Nutrition, Adult When you have diabetes, or diabetes mellitus, it is very important to have healthy eating habits because your blood sugar (glucose) levels are greatly affected by what you eat and drink. Eating healthy foods in the right amounts, at about the same times every day, can help you: Manage your blood glucose. Lower your risk of heart disease. Improve your blood pressure. Reach or maintain a healthy weight. What can affect my meal plan? Every person with diabetes is different, and each person has different needs for a meal plan. Your health care provider may recommend that you work with a dietitian to make a meal plan that is best for you. Your meal plan may vary depending on factors such as: The calories you need. The medicines you take. Your weight. Your blood glucose, blood pressure, and cholesterol levels. Your activity level. Other health conditions you have, such as heart or kidney disease. How do carbohydrates affect me? Carbohydrates, also called carbs, affect your blood glucose level more than any other type of food. Eating carbs raises the amount of glucose in your blood. It is important to know how many carbs you can safely have in each meal. This is different for every person. Your dietitian can help you calculate how many carbs you should have at each meal and for each snack. How does alcohol affect me? Alcohol can cause a decrease in blood glucose (hypoglycemia), especially if you use insulin or take certain diabetes medicines by mouth. Hypoglycemia can be a life-threatening condition. Symptoms of hypoglycemia, such as sleepiness, dizziness, and confusion, are similar to symptoms of having too much alcohol. Do not drink alcohol if: Your health care provider tells you not to drink. You are pregnant,  may be pregnant, or are planning to become pregnant. If you drink alcohol: Limit how much you have to: 0-1 drink a day for women. 0-2 drinks a day for men. Know how much alcohol is in your drink. In the [place] one drink equals one 12 oz bottle of beer (355 mL), one 5 oz glass of wine (148 mL), or one 1 oz glass of hard liquor (44 mL). Keep yourself hydrated with water, diet soda, or unsweetened iced tea. Keep in mind that regular soda, juice, and other mixers may contain a lot of sugar and must be counted as carbs. What are tips for following this plan? Reading food labels Start by checking the serving size on the Nutrition Facts label of packaged foods and drinks. The number of calories and the amount of carbs, fats, and other nutrients listed on the label are based on one serving of the item. Many items contain more than one serving per package. Check the total grams (g) of carbs in one serving. Check the number of grams of saturated fats and trans fats in one serving. Choose foods that have a low amount or none of these fats. Check the number of milligrams (mg) of salt (sodium)  in one serving. Most people should limit total sodium intake to less than 2,300 mg per day. Always check the nutrition information of foods labeled as "low-fat" or "nonfat." These foods may be higher in added sugar or refined carbs and should be avoided. Talk to your dietitian to identify your daily goals for nutrients listed on the label. Shopping Avoid buying canned, pre-made, or processed foods. These foods tend to be high in fat, sodium, and added sugar. Shop around the outside edge of the grocery store. This is where you will most often find fresh fruits and vegetables, bulk grains, fresh meats, and fresh dairy products. Cooking Use low-heat cooking methods, such as baking, instead of high-heat cooking methods, such as deep frying. Cook using healthy oils, such as olive, canola, or sunflower oil. Avoid cooking  with butter, cream, or high-fat meats. Meal planning Eat meals and snacks regularly, preferably at the same times every day. Avoid going long periods of time without eating. Eat foods that are high in fiber, such as fresh fruits, vegetables, beans, and whole grains. Eat 4-6 oz (112-168 g) of lean protein each day, such as lean meat, chicken, fish, eggs, or tofu. One ounce (oz) (28 g) of lean protein is equal to: 1 oz (28 g) of meat, chicken, or fish. 1 egg.  cup (62 g) of tofu. Eat some foods each day that contain healthy fats, such as avocado, nuts, seeds, and fish. What foods should I eat? Fruits Berries. Apples. Oranges. Peaches. Apricots. Plums. Grapes. Mangoes. Papayas. Pomegranates. Kiwi. Cherries. Vegetables Leafy greens, including lettuce, spinach, kale, chard, collard greens, mustard greens, and cabbage. Beets. Cauliflower. Broccoli. Carrots. Green beans. Tomatoes. Peppers. Onions. Cucumbers. Brussels sprouts. Grains Whole grains, such as whole-wheat or whole-grain bread, crackers, tortillas, cereal, and pasta. Unsweetened oatmeal. Quinoa. Brown or wild rice. Meats and other proteins Seafood. Poultry without skin. Lean cuts of poultry and beef. Tofu. Nuts. Seeds. Dairy Low-fat or fat-free dairy products such as milk, yogurt, and cheese. The items listed above may not be a complete list of foods and beverages you can eat and drink. Contact a dietitian for more information. What foods should I avoid? Fruits Fruits canned with syrup. Vegetables Canned vegetables. Frozen vegetables with butter or cream sauce. Grains Refined white flour and flour products such as bread, pasta, snack foods, and cereals. Avoid all processed foods. Meats and other proteins Fatty cuts of meat. Poultry with skin. Breaded or fried meats. Processed meat. Avoid saturated fats. Dairy Full-fat yogurt, cheese, or milk. Beverages Sweetened drinks, such as soda or iced tea. The items listed above may not be  a complete list of foods and beverages you should avoid. Contact a dietitian for more information. Questions to ask a health care provider Do I need to meet with a certified diabetes care and education specialist? Do I need to meet with a dietitian? What number can I call if I have questions? When are the best times to check my blood glucose? Where to find more information: American Diabetes Association: diabetes.org Academy of Nutrition and Dietetics: eatright.[employer] of Diabetes and Digestive and Kidney Diseases: [employer] Association of Diabetes Care & Education Specialists: diabeteseducator.org Summary It is important to have healthy eating habits because your blood sugar (glucose) levels are greatly affected by what you eat and drink. It is important to use alcohol carefully. A healthy meal plan will help you manage your blood glucose and lower your risk of heart disease. Your health care provider may recommend that you  work with a dietitian to make a meal plan that is best for you. This information is not intended to replace advice given to you by your health care provider. Make sure you discuss any questions you have with your health care provider. Document Revised: 02/13/2020 Document Reviewed: 02/13/2020 Elsevier Patient Education  [place].

## 2021-09-17 LAB — COMP. METABOLIC PANEL (12)
AST: 26 IU/L (ref 0–40)
Albumin/Globulin Ratio: 1.3 (ref 1.2–2.2)
Albumin: 4.2 g/dL (ref 3.8–4.9)
Alkaline Phosphatase: 101 IU/L (ref 44–121)
BUN/Creatinine Ratio: 18 (ref 9–23)
BUN: 12 mg/dL (ref 6–24)
Bilirubin Total: 0.5 mg/dL (ref 0.0–1.2)
Calcium: 10 mg/dL (ref 8.7–10.2)
Chloride: 97 mmol/L (ref 96–106)
Creatinine, Ser: 0.67 mg/dL (ref 0.57–1.00)
Globulin, Total: 3.2 g/dL (ref 1.5–4.5)
Glucose: 178 mg/dL — ABNORMAL HIGH (ref 70–99)
Potassium: 4.1 mmol/L (ref 3.5–5.2)
Sodium: 142 mmol/L (ref 134–144)
Total Protein: 7.4 g/dL (ref 6.0–8.5)
eGFR: 103 mL/min/{1.73_m2} (ref >59)

## 2021-10-16 ENCOUNTER — Encounter: Payer: Self-pay | Admitting: Nurse Practitioner

## 2021-10-16 ENCOUNTER — Telehealth (INDEPENDENT_AMBULATORY_CARE_PROVIDER_SITE_OTHER): Payer: Self-pay | Admitting: Nurse Practitioner

## 2021-10-16 DIAGNOSIS — I10 Essential (primary) hypertension: Secondary | ICD-10-CM

## 2021-10-16 DIAGNOSIS — E119 Type 2 diabetes mellitus without complications: Secondary | ICD-10-CM

## 2021-10-16 NOTE — Patient Instructions (Signed)
Managing Your Hypertension Hypertension, also called high blood pressure, is when the force of the blood pressing against the walls of the arteries is too strong. Arteries are blood vessels that carry blood from your heart throughout your body. Hypertension forces the heart to work harder to pump blood and may cause the arteries to become narrow or stiff. Understanding blood pressure readings Your personal target blood pressure may vary depending on your medical conditions, your age, and other factors. A blood pressure reading includes a higher number over a lower number. Ideally, your blood pressure should be below 120/80. You should know that: The first, or top, number is called the systolic pressure. It is a measure of the pressure in your arteries as your heart beats. The second, or bottom number, is called the diastolic pressure. It is a measure of the pressure in your arteries as the heart relaxes. Blood pressure is classified into four stages. Based on your blood pressure reading, your health care provider may use the following stages to determine what type of treatment you need, if any. Systolic pressure and diastolic pressure are measured in a unit called mmHg. Normal Systolic pressure: below 120. Diastolic pressure: below 80. Elevated Systolic pressure: 120-129. Diastolic pressure: below 80. Hypertension stage 1 Systolic pressure: 130-139. Diastolic pressure: 80-89. Hypertension stage 2 Systolic pressure: 140 or above. Diastolic pressure: 90 or above. How can this condition affect me? Managing your hypertension is an important responsibility. Over time, hypertension can damage the arteries and decrease blood flow to important parts of the body, including the brain, heart, and kidneys. Having untreated or uncontrolled hypertension can lead to: A heart attack. A stroke. A weakened blood vessel (aneurysm). Heart failure. Kidney damage. Eye damage. Metabolic syndrome. Memory and  concentration problems. Vascular dementia. What actions can I take to manage this condition? Hypertension can be managed by making lifestyle changes and possibly by taking medicines. Your health care provider will help you make a plan to bring your blood pressure within a normal range. Nutrition  Eat a diet that is high in fiber and potassium, and low in salt (sodium), added sugar, and fat. An example eating plan is called the Dietary Approaches to Stop Hypertension (DASH) diet. To eat this way: Eat plenty of fresh fruits and vegetables. Try to fill one-half of your plate at each meal with fruits and vegetables. Eat whole grains, such as whole-wheat pasta, brown rice, or whole-grain bread. Fill about one-fourth of your plate with whole grains. Eat low-fat dairy products. Avoid fatty cuts of meat, processed or cured meats, and poultry with skin. Fill about one-fourth of your plate with lean proteins such as fish, chicken without skin, beans, eggs, and tofu. Avoid pre-made and processed foods. These tend to be higher in sodium, added sugar, and fat. Reduce your daily sodium intake. Most people with hypertension should eat less than 1,500 mg of sodium a day. Lifestyle  Work with your health care provider to maintain a healthy body weight or to lose weight. Ask what an ideal weight is for you. Get at least 30 minutes of exercise that causes your heart to beat faster (aerobic exercise) most days of the week. Activities may include walking, swimming, or biking. Include exercise to strengthen your muscles (resistance exercise), such as weight lifting, as part of your weekly exercise routine. Try to do these types of exercises for 30 minutes at least 3 days a week. Do not use any products that contain nicotine or tobacco, such as cigarettes, e-cigarettes,   and chewing tobacco. If you need help quitting, ask your health care provider. Control any long-term (chronic) conditions you have, such as high  cholesterol or diabetes. Identify your sources of stress and find ways to manage stress. This may include meditation, deep breathing, or making time for fun activities. Alcohol use Do not drink alcohol if: Your health care provider tells you not to drink. You are pregnant, may be pregnant, or are planning to become pregnant. If you drink alcohol: Limit how much you use to: 0-1 drink a day for women. 0-2 drinks a day for men. Be aware of how much alcohol is in your drink. In the [place] one drink equals one 12 oz bottle of beer (355 mL), one 5 oz glass of wine (148 mL), or one 1 oz glass of hard liquor (44 mL). Medicines Your health care provider may prescribe medicine if lifestyle changes are not enough to get your blood pressure under control and if: Your systolic blood pressure is 130 or higher. Your diastolic blood pressure is 80 or higher. Take medicines only as told by your health care provider. Follow the directions carefully. Blood pressure medicines must be taken as told by your health care provider. The medicine does not work as well when you skip doses. Skipping doses also puts you at risk for problems. Monitoring Before you monitor your blood pressure: Do not smoke, drink caffeinated beverages, or exercise within 30 minutes before taking a measurement. Use the bathroom and empty your bladder (urinate). Sit quietly for at least 5 minutes before taking measurements. Monitor your blood pressure at home as told by your health care provider. To do this: Sit with your back straight and supported. Place your feet flat on the floor. Do not cross your legs. Support your arm on a flat surface, such as a table. Make sure your upper arm is at heart level. Each time you measure, take two or three readings one minute apart and record the results. You may also need to have your blood pressure checked regularly by your health care provider. General information Talk with your health care  provider about your diet, exercise habits, and other lifestyle factors that may be contributing to hypertension. Review all the medicines you take with your health care provider because there may be side effects or interactions. Keep all visits as told by your health care provider. Your health care provider can help you create and adjust your plan for managing your high blood pressure. Where to find more information National Heart, Lung, and Blood Institute: www.nhlbi.nih.gov American Heart Association: www.heart.org Contact a health care provider if: You think you are having a reaction to medicines you have taken. You have repeated (recurrent) headaches. You feel dizzy. You have swelling in your ankles. You have trouble with your vision. Get help right away if: You develop a severe headache or confusion. You have unusual weakness or numbness, or you feel faint. You have severe pain in your chest or abdomen. You vomit repeatedly. You have trouble breathing. These symptoms may represent a serious problem that is an emergency. Do not wait to see if the symptoms will go away. Get medical help right away. Call your local emergency services (911 in the [place] Do not drive yourself to the hospital. Summary Hypertension is when the force of blood pumping through your arteries is too strong. If this condition is not controlled, it may put you at risk for serious complications. Your personal target blood pressure may vary depending on   your medical conditions, your age, and other factors. For most people, a normal blood pressure is less than 120/80. Hypertension is managed by lifestyle changes, medicines, or both. Lifestyle changes to help manage hypertension include losing weight, eating a healthy, low-sodium diet, exercising more, stopping smoking, and limiting alcohol. This information is not intended to replace advice given to you by your health care provider. Make sure you discuss any questions  you have with your health care provider. Document Revised: 07/30/2019 Document Reviewed: 06/12/2019 Elsevier Patient Education  2022 [employer]  

## 2021-10-16 NOTE — Progress Notes (Signed)
? ?  [facility] ?[place], [place]  [address] ?Phone:  [phone]   Fax:  [phone] ?Virtual Visit via Video Note ? ?I connected with Nancy Pham on 10/16/21 at  3:00 PM EDT by video and verified that I am speaking with the correct person using two identifiers. ?  ?I discussed the limitations, risks, security and privacy concerns of performing an evaluation and management service by video and the availability of in person appointments. I also discussed with the patient that there may be a patient responsible charge related to this service. The patient expressed understanding and agreed to proceed. ? ?Patient home ?Provider Office ? ?History of Present Illness: ? has a past medical history of Asthma, Cancer ([place]), Diabetes mellitus, Gout, Hyperlipidemia, and Hypertension.  ? ?HPI  ? ?Nancy Pham is in today for follow up for Hypertension. The current prescribed treatment is Amlodipine 5 mg and atenolol 100 mg along with Furosemide 40 mg. She is taking the amlodipine.  Compliance is reported and home blood pressure monitoring is done with a range of    140/150-80's ?The  DASH diet is trying. She report it is better than what it was. An exercise regimen is not ongoing. There is a goal to improve her BP. Denies headache, dizziness, visual changes, shortness of breath, dyspnea on exertion, chest pain, nausea, vomiting or any edema.  ? ?She is also trying lifestyle modification with diet.  ? ?ROS ? ?  ?Observations/Objective: ?Virtual visit no exam  ? ?Assessment and Plan: ?1. Essential hypertension ?Persistent however stable ?Encouraged on going compliance with current medication regimen ?Encouraged home monitoring and recording BP <130/80 ?Eating a heart-healthy diet with less salt ?Encouraged regular physical activity  ?Recommend Weight loss ? ?2. Diabetes mellitus without complication ([place]) ?Uncontrolled ?Encourage regular CBG monitoring ?Encourage contacting office if excessive  hyperglycemia and or hypoglycemia ?Lifestyle modification with healthy diet (fewer calories, more high fiber foods, whole grains and non-starchy vegetables, lower fat meat and fish, low-fat diary include healthy oils) regular exercise (physical activity) and weight loss ? ? ?Follow Up Instructions: ?Return in about 2 months (around 12/16/2021) for Follow up HTN [id number], follow up [id number]. ? ?  ?I discussed the assessment and treatment plan with the patient. The patient was provided an opportunity to ask questions and all were answered. The patient agreed with the plan and demonstrated an understanding of the instructions. ?  ?The patient was advised to call back or seek an in-person evaluation if the symptoms worsen or if the condition fails to improve as anticipated. ? ?I provided 10 minutes of video- visit time during this encounter. ? ? ?Vevelyn Francois, NP ?  ? ?

## 2021-11-28 ENCOUNTER — Other Ambulatory Visit: Payer: Self-pay | Admitting: Nurse Practitioner

## 2021-11-28 DIAGNOSIS — R609 Edema, unspecified: Secondary | ICD-10-CM

## 2021-12-16 ENCOUNTER — Ambulatory Visit: Payer: Self-pay | Admitting: Nurse Practitioner

## 2022-01-23 ENCOUNTER — Other Ambulatory Visit: Payer: Self-pay | Admitting: Nurse Practitioner

## 2022-04-28 ENCOUNTER — Other Ambulatory Visit: Payer: Self-pay | Admitting: Nurse Practitioner

## 2022-04-28 DIAGNOSIS — G8929 Other chronic pain: Secondary | ICD-10-CM

## 2022-04-28 DIAGNOSIS — M1A09X0 Idiopathic chronic gout, multiple sites, without tophus (tophi): Secondary | ICD-10-CM

## 2022-10-11 ENCOUNTER — Other Ambulatory Visit: Payer: Self-pay

## 2022-10-11 MED ORDER — GLIMEPIRIDE 4 MG PO TABS: ORAL_TABLET | ORAL | 0 refills | Status: DC

## 2022-10-12 ENCOUNTER — Other Ambulatory Visit: Payer: Self-pay | Admitting: Nurse Practitioner

## 2022-10-12 DIAGNOSIS — R0602 Shortness of breath: Secondary | ICD-10-CM

## 2022-10-12 DIAGNOSIS — E785 Hyperlipidemia, unspecified: Secondary | ICD-10-CM

## 2022-12-14 ENCOUNTER — Other Ambulatory Visit (HOSPITAL_COMMUNITY): Payer: Self-pay

## 2022-12-14 ENCOUNTER — Ambulatory Visit (INDEPENDENT_AMBULATORY_CARE_PROVIDER_SITE_OTHER): Payer: Self-pay | Admitting: Nurse Practitioner

## 2022-12-14 ENCOUNTER — Encounter (HOSPITAL_COMMUNITY): Payer: Self-pay

## 2022-12-14 ENCOUNTER — Other Ambulatory Visit: Payer: Self-pay | Admitting: Nurse Practitioner

## 2022-12-14 ENCOUNTER — Encounter: Payer: Self-pay | Admitting: Nurse Practitioner

## 2022-12-14 VITALS — BP 168/80 | HR 61 | Ht 66.0 in | Wt 306.4 lb

## 2022-12-14 DIAGNOSIS — Z1329 Encounter for screening for other suspected endocrine disorder: Secondary | ICD-10-CM | POA: Insufficient documentation

## 2022-12-14 DIAGNOSIS — I10 Essential (primary) hypertension: Secondary | ICD-10-CM

## 2022-12-14 DIAGNOSIS — E1165 Type 2 diabetes mellitus with hyperglycemia: Secondary | ICD-10-CM

## 2022-12-14 DIAGNOSIS — G8929 Other chronic pain: Secondary | ICD-10-CM

## 2022-12-14 DIAGNOSIS — R600 Localized edema: Secondary | ICD-10-CM

## 2022-12-14 DIAGNOSIS — E785 Hyperlipidemia, unspecified: Secondary | ICD-10-CM

## 2022-12-14 DIAGNOSIS — Z6841 Body Mass Index (BMI) 40.0 and over, adult: Secondary | ICD-10-CM

## 2022-12-14 DIAGNOSIS — M25561 Pain in right knee: Secondary | ICD-10-CM

## 2022-12-14 LAB — POCT GLYCOSYLATED HEMOGLOBIN (HGB A1C): Hemoglobin A1C: 13.9 % — AB (ref 4.0–5.6)

## 2022-12-14 MED ORDER — CHLORTHALIDONE 25 MG PO TABS
25.0000 mg | ORAL_TABLET | Freq: Every day | ORAL | 1 refills | Status: DC
Start: 2022-12-14 — End: 2023-02-25
  Filled 2022-12-14: qty 90, 90d supply, fill #0

## 2022-12-14 MED ORDER — ATENOLOL 100 MG PO TABS: 100.0000 mg | ORAL_TABLET | Freq: Every day | ORAL | 0 refills | Status: DC

## 2022-12-14 MED ORDER — SEMAGLUTIDE(0.25 OR 0.5MG/DOS) 2 MG/3ML ~~LOC~~ SOPN
0.2500 mg | PEN_INJECTOR | SUBCUTANEOUS | 0 refills | Status: DC
  Filled 2022-12-14: qty 3, fill #0

## 2022-12-14 MED ORDER — CLONIDINE HCL 0.1 MG PO TABS
0.2000 mg | ORAL_TABLET | Freq: Once | ORAL | Status: AC
Start: 2022-12-14 — End: 2022-12-14
  Administered 2022-12-14: 0.2 mg via ORAL

## 2022-12-14 MED ORDER — GLIMEPIRIDE 4 MG PO TABS
4.0000 mg | ORAL_TABLET | Freq: Every day | ORAL | 0 refills | Status: DC
Start: 2022-12-14 — End: 2023-01-11
  Filled 2022-12-14: qty 60, 60d supply, fill #0

## 2022-12-14 MED ORDER — ROSUVASTATIN CALCIUM 40 MG PO TABS
40.0000 mg | ORAL_TABLET | Freq: Every day | ORAL | 0 refills | Status: DC
Start: 2022-12-14 — End: 2023-01-25
  Filled 2022-12-14: qty 90, 90d supply, fill #0

## 2022-12-14 MED ORDER — AMLODIPINE BESYLATE 5 MG PO TABS
5.0000 mg | ORAL_TABLET | Freq: Every day | ORAL | 1 refills | Status: DC
Start: 2022-12-14 — End: 2023-03-25
  Filled 2022-12-14: qty 90, 90d supply, fill #0

## 2022-12-14 NOTE — Assessment & Plan Note (Signed)
Rosuvastatin 40 mg daily refilled Avoid fatty fried foods LDL goal is less than 70

## 2022-12-14 NOTE — Patient Instructions (Addendum)
1. Primary hypertension  - CMP14+EGFR - chlorthalidone (HYGROTON) 25 MG tablet; Take 1 tablet (25 mg total) by mouth daily.  Dispense: 90 tablet; Refill: 1 - amLODipine (NORVASC) 5 MG tablet; Take 1 tablet (5 mg total) by mouth daily.  Dispense: 90 tablet; Refill: 1 - atenolol (TENORMIN) 100 MG tablet; Take 1 tablet (100 mg total) by mouth daily.  Dispense: 90 tablet; Refill: 0 - CBC  2. Bilateral leg edema  - Brain natriuretic peptide  3. Uncontrolled type 2 diabetes mellitus with hyperglycemia (HCC)  - POCT glycosylated hemoglobin (Hb A1C) - Microalbumin / creatinine urine ratio - Ambulatory referral to Ophthalmology - AMB Referral to Pharmacy Medication Management - glimepiride (AMARYL) 4 MG tablet; Take 1 tablet (4 mg total) by mouth daily before breakfast.  Dispense: 60 tablet; Refill: 0  4. Hyperlipidemia, unspecified hyperlipidemia type  - rosuvastatin (CRESTOR) 40 MG tablet; Take 1 tablet (40 mg total) by mouth daily.  Dispense: 90 tablet; Refill: 0  5. Chronic pain of right knee  Blood pressure goal is less than 130/80.  Please monitor your blood pressure at home keep a log and bring to next visit   It is important that you exercise regularly at least 30 minutes 5 times a week as tolerated  Think about what you will eat, plan ahead. Choose " clean, green, fresh or frozen" over canned, processed or packaged foods which are more sugary, salty and fatty. 70 to 75% of food eaten should be vegetables and fruit. Three meals at set times with snacks allowed between meals, but they must be fruit or vegetables. Aim to eat over a 12 hour period , example 7 am to 7 pm, and STOP after  your last meal of the day. Drink water,generally about 64 ounces per day, no other drink is as healthy. Fruit juice is best enjoyed in a healthy way, by EATING the fruit.  Thanks for choosing [facility] we consider it a privelige to serve you.

## 2022-12-14 NOTE — Progress Notes (Signed)
New Patient Office Visit  Subjective:  Patient ID: Nancy Pham, female    DOB: 06-12-1966  Age: 57 y.o. MRN: [id number]  CC:  Chief Complaint  Patient presents with   Knee Pain    HPI Evan Verney is a 57 y.o. female  has a past medical history of Asthma, Cancer (HCC), Diabetes mellitus, Gout, Hyperlipidemia, and Hypertension.  Patient presents to reestablish care for her chronic medical condition.  She was last seen in this office about a year ago.  Chronic right knee pain.  Patient complaining of worsening chronic right knee pain since the past 2 weeks.  Stated that she was initially unable to put pressure on her leg, but the pain is getting better now.  She has been taking ibuprofen and Tylenol as needed.  Also using a cane for ambulation.  She denies numbness, tingling, fever.  Hypertension.  Has amlodipine 5 mg daily, atenolol 100 mg daily, furosemide 40 mg twice daily ordered.  Stated that she has been getting amlodipine and atenolol from her sister.  She does not check her blood pressure at home she denies dizziness, chest pain, headaches.  Lasix has been helping her leg edema.  Type 2 diabetes.  Has glimepiride 4 mg daily ordered.  Patient stated that she has been taking the medication but medication has not been refilled recently.  She denies polyphagia, polyuria, polydipsia does not check her blood sugars at home.  She is currently uninsured will refer patient to the clinical pharmacist for medication assistance.     Past Medical History:  Diagnosis Date   Asthma    Cancer (HCC)    Diabetes mellitus    Gout    Hyperlipidemia    Hypertension     Past Surgical History:  Procedure Laterality Date   ABDOMINAL HYSTERECTOMY      Family History  Problem Relation Age of Onset   Seizures Mother    Heart attack Father    Heart attack Sister    Hypertension Sister    Hypertension Brother    Hypertension Brother    Hypertension Brother    Hypertension Brother     Hypertension Sister     Social History   Socioeconomic History   Marital status: Single    Spouse name: Not on file   Number of children: Not on file   Years of education: Not on file   Highest education level: Not on file  Occupational History   Not on file  Tobacco Use   Smoking status: Never   Smokeless tobacco: Never  Vaping Use   Vaping Use: Never used  Substance and Sexual Activity   Alcohol use: No   Drug use: No   Sexual activity: Not Currently  Other Topics Concern   Not on file  Social History Narrative   Not on file   Social Determinants of Health   Financial Resource Strain: Not on file  Food Insecurity: Not on file  Transportation Needs: Not on file  Physical Activity: Not on file  Stress: Not on file  Social Connections: Not on file  Intimate Partner Violence: Not on file    ROS Review of Systems  Constitutional:  Negative for activity change, appetite change, chills, fatigue and fever.  HENT:  Negative for congestion, dental problem, ear discharge, ear pain, hearing loss, rhinorrhea, sinus pressure, sinus pain, sneezing and sore throat.   Eyes:  Negative for pain, discharge, redness and itching.  Respiratory:  Negative for cough, chest tightness, shortness  of breath and wheezing.   Cardiovascular:  Negative for chest pain, palpitations and leg swelling.  Gastrointestinal:  Negative for abdominal distention, abdominal pain, anal bleeding, blood in stool, constipation, diarrhea, nausea, rectal pain and vomiting.  Endocrine: Negative for cold intolerance, heat intolerance, polydipsia, polyphagia and polyuria.  Genitourinary:  Negative for difficulty urinating, dysuria, flank pain, frequency, hematuria, menstrual problem, pelvic pain and vaginal bleeding.  Musculoskeletal:  Negative for arthralgias, back pain, gait problem, joint swelling and myalgias.  Skin:  Negative for color change, pallor, rash and wound.  Allergic/Immunologic: Negative for  environmental allergies, food allergies and immunocompromised state.  Neurological:  Negative for dizziness, tremors, facial asymmetry, weakness and headaches.  Hematological:  Negative for adenopathy. Does not bruise/bleed easily.  Psychiatric/Behavioral:  Negative for agitation, behavioral problems, confusion, decreased concentration, hallucinations, self-injury and suicidal ideas.     Objective:   Today's Vitals: BP (!) 168/80   Pulse 61   Ht 5\' 6"  (1.676 m)   Wt (!) 306 lb 6.4 oz (139 kg)   SpO2 98%   BMI 49.45 kg/m   Physical Exam Vitals and nursing note reviewed.  Constitutional:      General: She is not in acute distress.    Appearance: Normal appearance. She is obese. She is not ill-appearing, toxic-appearing or diaphoretic.  HENT:     Mouth/Throat:     Mouth: Mucous membranes are moist.     Pharynx: Oropharynx is clear. No oropharyngeal exudate or posterior oropharyngeal erythema.  Eyes:     General: No scleral icterus.       Right eye: No discharge.        Left eye: No discharge.     Extraocular Movements: Extraocular movements intact.     Conjunctiva/sclera: Conjunctivae normal.  Cardiovascular:     Rate and Rhythm: Normal rate and regular rhythm.     Pulses: Normal pulses.     Heart sounds: Normal heart sounds. No murmur heard.    No friction rub. No gallop.  Pulmonary:     Effort: Pulmonary effort is normal. No respiratory distress.     Breath sounds: Normal breath sounds. No stridor. No wheezing, rhonchi or rales.  Chest:     Chest wall: No tenderness.  Abdominal:     General: There is no distension.     Palpations: Abdomen is soft.     Tenderness: There is no abdominal tenderness. There is no right CVA tenderness, left CVA tenderness or guarding.  Musculoskeletal:        General: No swelling, tenderness, deformity or signs of injury.     Right lower leg: Edema present.     Left lower leg: Edema present.     Comments: Nonpitting edema bilateral lower  extremities Skin warm and dry no redness noted has palpable pedal pulses, able to ambulate using a cane.  No tenderness on passive range of motion of the knee  Skin:    General: Skin is warm and dry.     Capillary Refill: Capillary refill takes less than 2 seconds.     Coloration: Skin is not jaundiced or pale.     Findings: No bruising, erythema or lesion.  Neurological:     Mental Status: She is alert and oriented to person, place, and time.     Motor: No weakness.     Coordination: Coordination normal.     Gait: Gait normal.  Psychiatric:        Mood and Affect: Mood normal.  Behavior: Behavior normal.        Thought Content: Thought content normal.        Judgment: Judgment normal.     Assessment & Plan:   Problem List Items Addressed This Visit       Cardiovascular and Mediastinum   Hypertension - Primary    BP Readings from Last 3 Encounters:  12/14/22 (!) 168/80  09/16/21 (!) 173/92  06/10/21 (!) 174/75  Chronic uncontrolled condition I doubt her medication compliance Lasix 40 mg twice daily discontinue start Hygroton 25 mg daily, amlodipine 5 mg daily, labetalol 100 mg daily refilled. Clonidine 0.2 mg one-time dose given in the office today  Discussed DASH diet and dietary sodium restrictions Continue to increase dietary efforts and exercise.    CMP today BMP in 2 weeks, nurse visit in 2 weeks to recheck blood pressure Follow-up in the office in 4 weeks Patient referred to the clinical pharmacist for cost of medication assistance      Relevant Medications   chlorthalidone (HYGROTON) 25 MG tablet   amLODipine (NORVASC) 5 MG tablet   atenolol (TENORMIN) 100 MG tablet   rosuvastatin (CRESTOR) 40 MG tablet   Other Relevant Orders   CMP14+EGFR   CBC   Recheck vitals   Basic Metabolic Panel     Endocrine   Uncontrolled type 2 diabetes mellitus with hyperglycemia (HCC)    Lab Results  Component Value Date   HGBA1C 13.9 (A) 12/14/2022  Chronic  uncontrolled condition Glimepiride 4 mg daily refilled Start Ozempic 0.25 mg once weekly injection avoid fatty fried food while taking the medication side effects of Ozempic discussed.  She denies family or personal history of medullary thyroid cancer, personal history of pancreatitis Patient counseled on low-carb modified diet States that she has glucometer at home she was encouraged to check her blood sugar I have referred patient to the pharmacist to assist with medication management and assistance with cost of medication Follow-up in 4 weeks       Relevant Medications   Semaglutide,0.25 or 0.5MG /DOS, 2 MG/3ML SOPN   glimepiride (AMARYL) 4 MG tablet   rosuvastatin (CRESTOR) 40 MG tablet   Other Relevant Orders   POCT glycosylated hemoglobin (Hb A1C) (Completed)   Microalbumin / creatinine urine ratio   Ambulatory referral to Ophthalmology   AMB Referral to Pharmacy Medication Management     Other   Hyperlipidemia    Rosuvastatin 40 mg daily refilled Avoid fatty fried foods LDL goal is less than 70      Relevant Medications   chlorthalidone (HYGROTON) 25 MG tablet   amLODipine (NORVASC) 5 MG tablet   atenolol (TENORMIN) 100 MG tablet   rosuvastatin (CRESTOR) 40 MG tablet   Bilateral leg edema    DASH diet advised Use of Pressure socks encouraged Starting Hygroton 25 mg daily Checking BnP      Relevant Orders   Brain natriuretic peptide   Chronic pain of right knee    Continue OTC Tylenol and ibuprofen as needed Patient encouraged to lose weight Will consider getting a knee x-ray if her symptoms gets worse Patient encouraged to lose weight      Class 3 severe obesity with serious comorbidity and body mass index (BMI) of 45.0 to 49.9 in adult [facility])    Wt Readings from Last 3 Encounters:  12/14/22 (!) 306 lb 6.4 oz (139 kg)  09/16/21 (!) 313 lb 0.6 oz (142 kg)  06/10/21 (!) 309 lb 9.6 oz (140.4 kg)   Body mass  index is 49.45 kg/m. I Patient counseled on low-carb  modified diet Encouraged to begin regular moderate exercise of at least 150 minutes weekly as tolerated Starting Ozempic for uncontrolled type 2 diabetes patient has assist with weight management      Relevant Medications   Semaglutide,0.25 or 0.5MG /DOS, 2 MG/3ML SOPN   glimepiride (AMARYL) 4 MG tablet   Screening for thyroid disorder   Relevant Orders   TSH    Outpatient Encounter Medications as of 12/14/2022  Medication Sig   acetaminophen (TYLENOL) 325 MG tablet Take 325-650 mg by mouth every 6 (six) hours as needed for mild pain, fever or headache.    albuterol (VENTOLIN HFA) 108 (90 Base) MCG/ACT inhaler Inhale 1-2 puffs into the lungs every 6 (six) hours as needed for wheezing or shortness of breath. Please dispense 3 months supple.   allopurinol (ZYLOPRIM) 300 MG tablet TAKE ONE TABLET BY MOUTH ONE TIME DAILY   aspirin EC 81 MG tablet Take 1 tablet (81 mg total) by mouth daily.   chlorthalidone (HYGROTON) 25 MG tablet Take 1 tablet (25 mg total) by mouth daily.   Cholecalciferol (VITAMIN D3) 125 MCG (5000 UT) CAPS Take 1 capsule (5,000 Units total) by mouth daily.   famotidine (PEPCID) 20 MG tablet Take 1 tablet (20 mg total) by mouth daily.   ferrous sulfate 325 (65 FE) MG tablet Take 1 tablet (325 mg total) by mouth daily with breakfast.   fluticasone (FLONASE) 50 MCG/ACT nasal spray Place 2 sprays into both nostrils daily as needed for allergies or rhinitis.   gabapentin (NEURONTIN) 100 MG capsule TAKE ONE CAPSULE BY MOUTH THREE TIMES A DAY   meloxicam (MOBIC) 15 MG tablet TAKE ONE TABLET BY MOUTH EVERY DAY AS NEEDED FOR PAIN   montelukast (SINGULAIR) 10 MG tablet TAKE ONE TABLET BY MOUTH AT BEDTIME   Multiple Vitamins-Minerals (CENTRUM SILVER 50+WOMEN) TABS Take 1 tablet by mouth daily.   Probiotic Product (PROBIOTIC DAILY PO) Take 1 capsule by mouth daily.   Semaglutide,0.25 or 0.5MG /DOS, 2 MG/3ML SOPN Inject 0.25 mg into the skin once a week.   [DISCONTINUED] amLODipine  (NORVASC) 5 MG tablet Take 1 tablet (5 mg total) by mouth daily.   [DISCONTINUED] atenolol (TENORMIN) 100 MG tablet Take 1 tablet (100 mg total) by mouth daily.   [DISCONTINUED] furosemide (LASIX) 40 MG tablet TAKE 1 TABLET (40 MG TOTAL) BY MOUTH 2 (TWO) TIMES DAILY AS NEEDED FOR EDEMA.   [DISCONTINUED] glimepiride (AMARYL) 4 MG tablet TAKE ONE TABLET BY MOUTH ONE TIME DAILY BEFORE BREAKFAST   [DISCONTINUED] rosuvastatin (CRESTOR) 40 MG tablet TAKE ONE TABLET BY MOUTH ONE TIME DAILY   amLODipine (NORVASC) 5 MG tablet Take 1 tablet (5 mg total) by mouth daily.   atenolol (TENORMIN) 100 MG tablet Take 1 tablet (100 mg total) by mouth daily.   cyclobenzaprine (FLEXERIL) 10 MG tablet Take 1 tablet (10 mg total) by mouth 3 (three) times daily as needed for muscle spasms. TAKE ONE TABLET BY MOUTH THREE TIMES DAILY AS NEEDED FOR MUSCLE SPASMS. (Patient not taking: Reported on 12/14/2022)   glimepiride (AMARYL) 4 MG tablet Take 1 tablet (4 mg total) by mouth daily before breakfast.   hydrocortisone cream (HYDROCORTISONE ANTI-ITCH) 1 % Apply 1 application topically 2 (two) times daily. (Patient not taking: Reported on 12/14/2022)   rosuvastatin (CRESTOR) 40 MG tablet Take 1 tablet (40 mg total) by mouth daily.   triamcinolone cream (KENALOG) 0.1 % Apply 1 application topically 2 (two) times daily. (Patient not  taking: Reported on 09/16/2021)   [DISCONTINUED] Liraglutide (VICTOZA) 18 MG/3ML SOLN injection Inject 1.8 mLs (10.8 mg total) into the skin daily.   Facility-Administered Encounter Medications as of 12/14/2022  Medication   cloNIDine (CATAPRES) tablet 0.2 mg   [COMPLETED] cloNIDine (CATAPRES) tablet 0.2 mg    Follow-up: Return in about 4 weeks (around 01/11/2023).   Donell Beers, FNP

## 2022-12-14 NOTE — Assessment & Plan Note (Addendum)
Continue OTC Tylenol and ibuprofen as needed Patient encouraged to lose weight Will consider getting a knee x-ray if her symptoms gets worse Patient encouraged to lose weight

## 2022-12-14 NOTE — Assessment & Plan Note (Signed)
Lab Results  Component Value Date   HGBA1C 13.9 (A) 12/14/2022  Chronic uncontrolled condition Glimepiride 4 mg daily refilled Start Ozempic 0.25 mg once weekly injection avoid fatty fried food while taking the medication side effects of Ozempic discussed.  She denies family or personal history of medullary thyroid cancer, personal history of pancreatitis Patient counseled on low-carb modified diet States that she has glucometer at home she was encouraged to check her blood sugar I have referred patient to the pharmacist to assist with medication management and assistance with cost of medication Follow-up in 4 weeks

## 2022-12-14 NOTE — Assessment & Plan Note (Addendum)
BP Readings from Last 3 Encounters:  12/14/22 (!) 168/80  09/16/21 (!) 173/92  06/10/21 (!) 174/75  Chronic uncontrolled condition I doubt her medication compliance Lasix 40 mg twice daily discontinue start Hygroton 25 mg daily, amlodipine 5 mg daily, labetalol 100 mg daily refilled. Clonidine 0.2 mg one-time dose given in the office today  Discussed DASH diet and dietary sodium restrictions Continue to increase dietary efforts and exercise.    CMP today BMP in 2 weeks, nurse visit in 2 weeks to recheck blood pressure Follow-up in the office in 4 weeks Patient referred to the clinical pharmacist for cost of medication assistance

## 2022-12-14 NOTE — Assessment & Plan Note (Signed)
Wt Readings from Last 3 Encounters:  12/14/22 (!) 306 lb 6.4 oz (139 kg)  09/16/21 (!) 313 lb 0.6 oz (142 kg)  06/10/21 (!) 309 lb 9.6 oz (140.4 kg)   Body mass index is 49.45 kg/m. I Patient counseled on low-carb modified diet Encouraged to begin regular moderate exercise of at least 150 minutes weekly as tolerated Starting Ozempic for uncontrolled type 2 diabetes patient has assist with weight management

## 2022-12-14 NOTE — Assessment & Plan Note (Signed)
DASH diet advised Use of Pressure socks encouraged Starting Hygroton 25 mg daily Checking BnP

## 2022-12-15 LAB — CMP14+EGFR
ALT: 28 IU/L (ref 0–32)
Albumin: 4.2 g/dL (ref 3.8–4.9)
Alkaline Phosphatase: 125 IU/L — ABNORMAL HIGH (ref 44–121)
BUN/Creatinine Ratio: 19 (ref 9–23)
Chloride: 95 mmol/L — ABNORMAL LOW (ref 96–106)
Total Protein: 7.1 g/dL (ref 6.0–8.5)
eGFR: 102 mL/min/{1.73_m2} (ref >59)

## 2022-12-15 LAB — CBC
MCHC: 32.7 g/dL (ref 31.5–35.7)
RDW: 12.6 % (ref 11.7–15.4)
WBC: 5.7 10*3/uL (ref 3.4–10.8)

## 2022-12-16 ENCOUNTER — Telehealth: Payer: Self-pay

## 2022-12-16 LAB — CMP14+EGFR
AST: 20 IU/L (ref 0–40)
Albumin/Globulin Ratio: 1.4 (ref 1.2–2.2)
BUN: 13 mg/dL (ref 6–24)
Bilirubin Total: 0.3 mg/dL (ref 0.0–1.2)
CO2: 23 mmol/L (ref 20–29)
Calcium: 9.2 mg/dL (ref 8.7–10.2)
Creatinine, Ser: 0.67 mg/dL (ref 0.57–1.00)
Globulin, Total: 2.9 g/dL (ref 1.5–4.5)
Glucose: 363 mg/dL — ABNORMAL HIGH (ref 70–99)
Potassium: 4.2 mmol/L (ref 3.5–5.2)
Sodium: 132 mmol/L — ABNORMAL LOW (ref 134–144)

## 2022-12-16 LAB — TSH: TSH: 1.68 u[IU]/mL (ref 0.450–4.500)

## 2022-12-16 LAB — MICROALBUMIN / CREATININE URINE RATIO
Creatinine, Urine: 51.9 mg/dL
Microalb/Creat Ratio: 285 mg/g creat — ABNORMAL HIGH (ref 0–29)
Microalbumin, Urine: 148 ug/mL

## 2022-12-16 LAB — CBC
Hematocrit: 40.1 % (ref 34.0–46.6)
Hemoglobin: 13.1 g/dL (ref 11.1–15.9)
MCH: 29 pg (ref 26.6–33.0)
MCV: 89 fL (ref 79–97)
Platelets: 271 10*3/uL (ref 150–450)
RBC: 4.51 x10E6/uL (ref 3.77–5.28)

## 2022-12-16 LAB — BRAIN NATRIURETIC PEPTIDE: BNP: 18.4 pg/mL (ref 0.0–100.0)

## 2022-12-16 NOTE — Progress Notes (Signed)
   Care Guide Note  12/16/2022 Name: Cambra Adwell MRN: [id number] DOB: September 20, 1965  Referred by: Donell Beers, FNP Reason for referral : Care Coordination (Outreach to schedule with Pharm d )   Mars Towsley is a 57 y.o. year old female who is a primary care patient of Donell Beers, FNP. Rishika Oro was referred to the pharmacist for assistance related to DM.    An unsuccessful telephone outreach was attempted today to contact the patient who was referred to the pharmacy team for assistance with medication management. Additional attempts will be made to contact the patient.   Penne Lash, RMA Care Guide [facility]  [place], [name] [address] Direct Dial: [phone] [email or web address]

## 2022-12-16 NOTE — Progress Notes (Signed)
   Care Guide Note  12/16/2022 Name: Nancy Pham MRN: [id number] DOB: 10-29-1965  Referred by: Donell Beers, FNP Reason for referral : Care Coordination (Outreach to schedule with Pharm d )   Nancy Pham is a 57 y.o. year old female who is a primary care patient of Donell Beers, FNP. Nancy Pham was referred to the pharmacist for assistance related to DM.    Successful contact was made with the patient to discuss pharmacy services including being ready for the pharmacist to call at least 5 minutes before the scheduled appointment time, to have medication bottles and any blood sugar or blood pressure readings ready for review. The patient agreed to meet with the pharmacist via with the pharmacist via telephone visit on (date/time).  12/23/2022  Nancy Pham, RMA Care Guide [facility]  [place], [name] [address] Direct Dial: [phone] [email or web address]

## 2022-12-23 ENCOUNTER — Other Ambulatory Visit: Payer: Self-pay | Admitting: Pharmacist

## 2022-12-23 ENCOUNTER — Other Ambulatory Visit: Payer: Self-pay

## 2022-12-23 MED ORDER — SEMAGLUTIDE(0.25 OR 0.5MG/DOS) 2 MG/3ML ~~LOC~~ SOPN
0.5000 mg | PEN_INJECTOR | SUBCUTANEOUS | 1 refills | Status: DC
  Filled 2022-12-23: qty 12, fill #0
  Filled 2023-01-03: qty 9, 84d supply, fill #0

## 2022-12-23 NOTE — Patient Instructions (Addendum)
[place],    [place] has recently expanded access to [employer]. [place] is offering assistance for patients seeking to apply for Medicaid to connect in-person with [facility] DSS Medicaid Eligibility and Enrollment Specialists. With no appointment necessary, individuals can simply walk in and receive assistance from an available caseworker. They will provide help in submitting applications for Medicaid.   Where: [facility] [address] [phone]. East in [place]; fourth floor) When: Mondays through Fridays between 8am and 5pm (closed 12:30-1:30pm for lunch)  You can also call [phone] to get free help from our [facility] Expansion Application Assistance team or visit [email or web address] to learn more about eligibility and how to apply.   Please also stop by the Pharmacy at [facility] to complete the patient assistance application for Ozempic.   Check your blood sugars twice daily:  1) Fasting, first thing in the morning before breakfast and  2) 2 hours after your largest meal.   For a goal A1c of less than 7%, goal fasting readings are less than 130 and goal 2 hour after meal readings are less than 180.    Take care!  Catie Eppie Gibson, PharmD, BCACP, CPP [facility] [phone]

## 2022-12-23 NOTE — Progress Notes (Signed)
12/23/2022 Name: Nancy Pham MRN: [id number] DOB: 06-Oct-1965  Chief Complaint  Patient presents with   Medication Management   Diabetes   Hypertension   Hyperlipidemia    Nancy Pham is a 57 y.o. year old female who presented for a telephone visit.   They were referred to the pharmacist by their PCP for assistance in managing diabetes, hypertension, and hyperlipidemia.   Subjective:  Care Team: Primary Care Provider: Donell Beers, FNP ; Next Scheduled Visit: 01/11/23  Medication Access/Adherence  Current Pharmacy:  [facility] [address] [facility] - [place], [name] - [address] [name] [address] Phone: [phone] Fax: [phone]  [facility] [address] [name] - [address] [name] [address] Phone: [phone] Fax: [phone]  [place] [facility] [address] [name] [address] Phone: [phone] Fax: [phone]   Patient reports affordability concerns with their medications: Yes  Patient reports access/transportation concerns to their pharmacy: Yes  Patient reports adherence concerns with their medications:  Yes    Discussed Medicaid. Providing information for Medicaid Enrollment Team.   Is between houses. Lives with her sister, has been hopping around.   Diabetes:  First diagnosed in 30s (she thinks)  Current medications: glimepiride 4 mg daily, Ozempic - has not been able to afford Medications tried in the past: metformin both IR and XR - nausea and vomiting; Victoza -did well, lost insurance and had to stop  Current glucose readings: not checking, but does have a glucometer   Patient denies hypoglycemic s/sx including dizziness, shakiness, sweating. Patient reports hyperglycemic symptoms including polyuria, polydipsia, nocturia, blurred vision.   Current meal patterns:  - Breakfast: not usually, skips - Lunch: sandwich -  [place], salami; wheat bread; honey mustard sauce; imitation crab meat and tomatoes; water; sometimes juice  - Supper: sometimes sandwiches; sometimes ramen w/ chicken;  - Snacks: chips - Drinks: sparkling water;   Current physical activity: none   Current medication access support: uninsured   Hypertension:  Current medications: chlorthalidone 25 mg daily, amlodipine 5 mg QPM, atenolol 100 mg daily Hx angioedema with ACEi  Patient has a wrist upper arm home BP cuff  Patient denies hypotensive s/sx including dizziness, lightheadedness.  Patient reports improvement hypertensive symptoms including headache.  Hyperlipidemia/ASCVD Risk Reduction  Current lipid lowering medications: rosuvastatin 40 mg daily  Antiplatelet regimen: aspirin 81 mg daily   Gout: Current medications: allopurinol 300 mg   Reports no gout flares since starting this medication  Objective:  Lab Results  Component Value Date   HGBA1C 13.9 (A) 12/14/2022    Lab Results  Component Value Date   CREATININE 0.67 12/14/2022   BUN 13 12/14/2022   NA 132 (L) 12/14/2022   K 4.2 12/14/2022   CL 95 (L) 12/14/2022   CO2 23 12/14/2022    Lab Results  Component Value Date   CHOL 189 06/10/2021   HDL 49 06/10/2021   LDLCALC 98 06/10/2021   TRIG 247 (H) 06/10/2021   CHOLHDL 3.9 06/10/2021    Medications Reviewed Today     Reviewed by Alden Hipp, RPH-CPP (Pharmacist) on 12/23/22 at 1027  Med List Status: <None>   Medication Order Taking? Sig Documenting Provider Last Dose Status Informant  acetaminophen (TYLENOL) 325 MG tablet [id number] Yes Take 325-650 mg by mouth every 6 (six) hours as needed for mild pain, fever or headache.  [provider] Taking Active Self  Med Note Clearance Coots, Coretta Leisey T   Thu Dec 23, 2022 10:24 AM)    albuterol (VENTOLIN HFA) 108 (90 Base) MCG/ACT inhaler [id number] No Inhale 1-2 puffs into the lungs every 6 (six) hours as needed for wheezing or  shortness of breath. Please dispense 3 months supple.  Patient not taking: Reported on 12/23/2022   [name], FNP Not Taking Active            Med Note Merla Riches Dec 23, 2022 10:25 AM)    allopurinol (ZYLOPRIM) 300 MG tablet [id number] Yes TAKE ONE TABLET BY MOUTH ONE TIME DAILY Ivonne Andrew, NP Taking Active   amLODipine (NORVASC) 5 MG tablet [id number] Yes Take 1 tablet (5 mg total) by mouth daily. Donell Beers, FNP Taking Active   aspirin EC 81 MG tablet [id number] Yes Take 1 tablet (81 mg total) by mouth daily. [name], FNP Taking Active   atenolol (TENORMIN) 100 MG tablet [id number] Yes Take 1 tablet (100 mg total) by mouth daily. Donell Beers, FNP Taking Active   cetirizine (ZYRTEC) 10 MG tablet [id number] Yes Take 10 mg by mouth daily. [provider] Taking Active   chlorthalidone (HYGROTON) 25 MG tablet [id number] Yes Take 1 tablet (25 mg total) by mouth daily. Donell Beers, FNP Taking Active   Cholecalciferol (VITAMIN D3) 125 MCG (5000 UT) CAPS [id number] Yes Take 1 capsule (5,000 Units total) by mouth daily. [name], FNP Taking Active   cloNIDine (CATAPRES) tablet 0.2 mg [id number]   Barbette Merino, NP  Active   cyclobenzaprine (FLEXERIL) 10 MG tablet [id number] No Take 1 tablet (10 mg total) by mouth 3 (three) times daily as needed for muscle spasms. TAKE ONE TABLET BY MOUTH THREE TIMES DAILY AS NEEDED FOR MUSCLE SPASMS.  Patient not taking: Reported on 12/14/2022   Barbette Merino, NP Not Taking Active   famotidine (PEPCID) 20 MG tablet [id number] Yes Take 1 tablet (20 mg total) by mouth daily. Barbette Merino, NP Taking Active   ferrous sulfate 325 (65 FE) MG tablet [id number] Yes Take 1 tablet (325 mg total) by mouth daily with breakfast. [name], FNP Taking Active   fluticasone [facility]) 50 MCG/ACT nasal spray [id number] No Place 2 sprays into both nostrils daily as needed for allergies or rhinitis.   Patient not taking: Reported on 12/23/2022   [name], FNP Not Taking Active            Med Note Merla Riches Dec 23, 2022 10:24 AM)    gabapentin (NEURONTIN) 100 MG capsule [id number] Yes TAKE ONE CAPSULE BY MOUTH THREE TIMES A DAY Ivonne Andrew, NP Taking Active   glimepiride (AMARYL) 4 MG tablet [id number] Yes Take 1 tablet (4 mg total) by mouth daily before breakfast. Donell Beers, FNP Taking Active   hydrocortisone cream (HYDROCORTISONE ANTI-ITCH) 1 % [id number] No Apply 1 application topically 2 (two) times daily.  Patient not taking: Reported on 12/14/2022   [name], FNP Not Taking Active   Inulin (FIBER CHOICE PO) [id number] Yes Take 1 capsule by mouth daily. [provider] Taking Active     Discontinued 08/15/19 1518          Med Note Antony Madura, Dyane Dustman Aug 21, 2017  4:16 PM) Regimen verified by the patient  meloxicam (MOBIC) 15 MG tablet [id number] Yes TAKE ONE TABLET BY MOUTH EVERY DAY AS NEEDED  FOR PAIN Barbette Merino, NP Taking Active   Multiple Vitamins-Minerals (CENTRUM SILVER 50+WOMEN) TABS [id number] Yes Take 1 tablet by mouth daily. Barbette Merino, NP Taking Active   Omega-3 Fatty Acids (FISH OIL PO) [id number] Yes Take 1 capsule by mouth daily. [provider] Taking Active   Probiotic Product (PROBIOTIC DAILY PO) [id number] Yes Take 1 capsule by mouth daily. [provider] Taking Active Self  rosuvastatin (CRESTOR) 40 MG tablet [id number] Yes Take 1 tablet (40 mg total) by mouth daily. Donell Beers, FNP Taking Active   Semaglutide,0.25 or 0.5MG /DOS, 2 MG/3ML SOPN [id number] No Inject 0.25 mg into the skin once a week.  Patient not taking: Reported on 12/23/2022   Donell Beers, FNP Not Taking Active   triamcinolone cream (KENALOG) 0.1 % [id number]  Apply 1 application topically 2 (two) times daily.  Patient not taking: Reported on 09/16/2021   [name], FNP  Active            Med Note  [name]   Fri Sep 14, 2019  2:06 PM) As needed.              Assessment/Plan:   Diabetes: - Currently uncontrolled - Reviewed long term cardiovascular and renal outcomes of uncontrolled blood sugar - Reviewed goal A1c, goal fasting, and goal 2 hour post prandial glucose - Reviewed dietary modifications including: focus on lean proteins, fruits and vegetables, whole grains - Reviewed lifestyle modifications including: eventual goal of 150 minutes of moderate intensity exercise weekly - Recommend to attempt Medicaid application. Information about Medicaid enrollment team provided.  - In the meantime, will apply for Ozempic assistance. Script sent to Pharmacy at [facility] for PAP assistance  - Recommend to check glucose twice daily, fasting and 2 hour post prandial.  - Continue glimepiride at this time. Discussed goal to reduce/eliminate due to risk of hypoglycemia and weight gain  Hypertension: - Currently uncontrolled but was due to med access.  - Follow up for BP check next week as scheduled. If Medicaid approved, can order home BP cuff - Recommend to continue current regimen at this time  Hyperlipidemia/ASCVD Risk Reduction: - Currently likely uncontrolled due to prior med access concerns.  - Recommend to continue current regimen at this time  Gout: - Unknown control, recommend check uric acid with next labs. Goal <6. Reassuring that no recent flares - Recommend to continue current regimen at this tiem  Follow Up Plan: phone call in 6 weeks  Catie Eppie Gibson, PharmD, BCACP, CPP [facility] [phone]

## 2022-12-24 ENCOUNTER — Other Ambulatory Visit: Payer: Self-pay

## 2022-12-28 ENCOUNTER — Other Ambulatory Visit: Payer: Self-pay

## 2022-12-28 ENCOUNTER — Ambulatory Visit: Payer: Self-pay

## 2022-12-28 DIAGNOSIS — I10 Essential (primary) hypertension: Secondary | ICD-10-CM

## 2022-12-28 NOTE — Progress Notes (Signed)
Blood Pressure Recheck Visit  Name: Nancy Pham MRN: [id number] Date of Birth: 07-Jan-1966  Nancy Pham presents today for Blood Pressure recheck with clinical support staff.  Order for BP recheck by Edwin Dada , ordered on 12/14/22 .   BP Readings from Last 3 Encounters:  12/14/22 (!) 168/80  09/16/21 (!) 173/92  06/10/21 (!) 174/75    Current Outpatient Medications  Medication Sig Dispense Refill   acetaminophen (TYLENOL) 325 MG tablet Take 325-650 mg by mouth every 6 (six) hours as needed for mild pain, fever or headache.      albuterol (VENTOLIN HFA) 108 (90 Base) MCG/ACT inhaler Inhale 1-2 puffs into the lungs every 6 (six) hours as needed for wheezing or shortness of breath. Please dispense 3 months supple. (Patient not taking: Reported on 12/23/2022) 8 g 11   allopurinol (ZYLOPRIM) 300 MG tablet TAKE ONE TABLET BY MOUTH ONE TIME DAILY 90 tablet 3   amLODipine (NORVASC) 5 MG tablet Take 1 tablet (5 mg total) by mouth daily. 90 tablet 1   aspirin EC 81 MG tablet Take 1 tablet (81 mg total) by mouth daily. 90 tablet 1   atenolol (TENORMIN) 100 MG tablet Take 1 tablet (100 mg total) by mouth daily. 90 tablet 0   cetirizine (ZYRTEC) 10 MG tablet Take 10 mg by mouth daily.     chlorthalidone (HYGROTON) 25 MG tablet Take 1 tablet (25 mg total) by mouth daily. 90 tablet 1   Cholecalciferol (VITAMIN D3) 125 MCG (5000 UT) CAPS Take 1 capsule (5,000 Units total) by mouth daily. 15 capsule 1   cyclobenzaprine (FLEXERIL) 10 MG tablet Take 1 tablet (10 mg total) by mouth 3 (three) times daily as needed for muscle spasms. TAKE ONE TABLET BY MOUTH THREE TIMES DAILY AS NEEDED FOR MUSCLE SPASMS. (Patient not taking: Reported on 12/14/2022) 90 tablet 2   famotidine (PEPCID) 20 MG tablet Take 1 tablet (20 mg total) by mouth daily. 90 tablet 3   ferrous sulfate 325 (65 FE) MG tablet Take 1 tablet (325 mg total) by mouth daily with breakfast. 90 tablet 3   fluticasone (FLONASE) 50 MCG/ACT  nasal spray Place 2 sprays into both nostrils daily as needed for allergies or rhinitis. (Patient not taking: Reported on 12/23/2022) 11.1 mL 11   gabapentin (NEURONTIN) 100 MG capsule TAKE ONE CAPSULE BY MOUTH THREE TIMES A DAY 270 capsule 3   glimepiride (AMARYL) 4 MG tablet Take 1 tablet (4 mg total) by mouth daily before breakfast. 60 tablet 0   hydrocortisone cream (HYDROCORTISONE ANTI-ITCH) 1 % Apply 1 application topically 2 (two) times daily. (Patient not taking: Reported on 12/14/2022) 30 g 3   Inulin (FIBER CHOICE PO) Take 1 capsule by mouth daily.     meloxicam (MOBIC) 15 MG tablet TAKE ONE TABLET BY MOUTH EVERY DAY AS NEEDED FOR PAIN 60 tablet 2   Multiple Vitamins-Minerals (CENTRUM SILVER 50+WOMEN) TABS Take 1 tablet by mouth daily. 90 tablet 1   Omega-3 Fatty Acids (FISH OIL PO) Take 1 capsule by mouth daily.     Probiotic Product (PROBIOTIC DAILY PO) Take 1 capsule by mouth daily.     rosuvastatin (CRESTOR) 40 MG tablet Take 1 tablet (40 mg total) by mouth daily. 90 tablet 0   Semaglutide,0.25 or 0.5MG /DOS, 2 MG/3ML SOPN Inject 0.5 mg into the skin once a week. 12 mL 1   triamcinolone cream (KENALOG) 0.1 % Apply 1 application topically 2 (two) times daily. (Patient not taking: Reported on 09/16/2021) 30  g 6   Current Facility-Administered Medications  Medication Dose Route Frequency Provider Last Rate Last Admin   cloNIDine (CATAPRES) tablet 0.2 mg  0.2 mg Oral Once Barbette Merino, NP        Hypertensive Medication Review: Patient states that they are taking all their hypertensive medications as prescribed and their last dose of hypertensive medications was this morning   Documentation of any medication adherence discrepancies: none  Provider Recommendation:  Spoke to [employer]  and they stated: continue with current medication.  Patient has been scheduled to follow up with 01/11/23    Patient has been given provider's recommendations and does not have any questions or  concerns at this time. Patient will contact the office for any future questions or concerns.

## 2022-12-29 ENCOUNTER — Other Ambulatory Visit: Payer: Self-pay | Admitting: Nurse Practitioner

## 2022-12-29 DIAGNOSIS — M1A09X0 Idiopathic chronic gout, multiple sites, without tophus (tophi): Secondary | ICD-10-CM

## 2022-12-29 LAB — BASIC METABOLIC PANEL
BUN/Creatinine Ratio: 21 (ref 9–23)
BUN: 16 mg/dL (ref 6–24)
CO2: 29 mmol/L (ref 20–29)
Calcium: 9.6 mg/dL (ref 8.7–10.2)
Chloride: 96 mmol/L (ref 96–106)
Creatinine, Ser: 0.76 mg/dL (ref 0.57–1.00)
Glucose: 310 mg/dL — ABNORMAL HIGH (ref 70–99)
Potassium: 3.5 mmol/L (ref 3.5–5.2)
Sodium: 139 mmol/L (ref 134–144)
eGFR: 91 mL/min/{1.73_m2} (ref >59)

## 2023-01-03 ENCOUNTER — Telehealth: Payer: Self-pay

## 2023-01-03 ENCOUNTER — Other Ambulatory Visit: Payer: Self-pay

## 2023-01-03 ENCOUNTER — Other Ambulatory Visit: Payer: Self-pay | Admitting: Nurse Practitioner

## 2023-01-03 DIAGNOSIS — E1165 Type 2 diabetes mellitus with hyperglycemia: Secondary | ICD-10-CM

## 2023-01-03 MED ORDER — TRULICITY 0.75 MG/0.5ML ~~LOC~~ SOAJ
0.7500 mg | SUBCUTANEOUS | 0 refills | Status: DC
Start: 2023-01-03 — End: 2023-01-25

## 2023-01-03 NOTE — Telephone Encounter (Signed)
Called pt and left a message in regard this medication advise pt to call with any questions or concerns. Gh

## 2023-01-03 NOTE — Telephone Encounter (Signed)
Patient's insurance plan requires trial and failure of both Trulicity and Victoza before coverage of Ozempic will be approved. If appropriate, can the Ozempic be changed to Trulicity?

## 2023-01-04 NOTE — Telephone Encounter (Signed)
Called pt and leave a message for pt to call back with any questions or concerns. Gh

## 2023-01-08 ENCOUNTER — Encounter (HOSPITAL_COMMUNITY): Payer: Self-pay | Admitting: *Deleted

## 2023-01-08 ENCOUNTER — Emergency Department (HOSPITAL_COMMUNITY): Payer: 59

## 2023-01-08 ENCOUNTER — Other Ambulatory Visit: Payer: Self-pay

## 2023-01-08 ENCOUNTER — Emergency Department (HOSPITAL_COMMUNITY)
Admission: EM | Admit: 2023-01-08 | Discharge: 2023-01-08 | Disposition: A | Discharge status: Home / Self Care | Payer: 59 | Attending: Emergency Medicine | Admitting: Emergency Medicine

## 2023-01-08 DIAGNOSIS — N12 Tubulo-interstitial nephritis, not specified as acute or chronic: Secondary | ICD-10-CM | POA: Diagnosis not present

## 2023-01-08 DIAGNOSIS — E876 Hypokalemia: Secondary | ICD-10-CM | POA: Diagnosis not present

## 2023-01-08 DIAGNOSIS — Z7982 Long term (current) use of aspirin: Secondary | ICD-10-CM | POA: Insufficient documentation

## 2023-01-08 DIAGNOSIS — Z794 Long term (current) use of insulin: Secondary | ICD-10-CM | POA: Diagnosis not present

## 2023-01-08 DIAGNOSIS — R109 Unspecified abdominal pain: Secondary | ICD-10-CM | POA: Diagnosis present

## 2023-01-08 LAB — URINALYSIS, ROUTINE W REFLEX MICROSCOPIC
Bilirubin Urine: NEGATIVE
Glucose, UA: >=500 mg/dL — AB
Ketones, ur: 20 mg/dL — AB
Nitrite: NEGATIVE
Protein, ur: 100 mg/dL — AB
Specific Gravity, Urine: 1.025 (ref 1.005–1.030)
WBC, UA: >50 WBC/hpf (ref 0–5)
pH: 6 (ref 5.0–8.0)

## 2023-01-08 LAB — HEPATIC FUNCTION PANEL
ALT: 32 U/L (ref 0–44)
AST: 43 U/L — ABNORMAL HIGH (ref 15–41)
Albumin: 3.2 g/dL — ABNORMAL LOW (ref 3.5–5.0)
Alkaline Phosphatase: 102 U/L (ref 38–126)
Bilirubin, Direct: 0.1 mg/dL (ref 0.0–0.2)
Indirect Bilirubin: 0.9 mg/dL (ref 0.3–0.9)
Total Bilirubin: 1 mg/dL (ref 0.3–1.2)
Total Protein: 8.5 g/dL — ABNORMAL HIGH (ref 6.5–8.1)

## 2023-01-08 LAB — BASIC METABOLIC PANEL
Anion gap: 15 (ref 5–15)
BUN: 13 mg/dL (ref 6–20)
CO2: 25 mmol/L (ref 22–32)
Calcium: 9 mg/dL (ref 8.9–10.3)
Chloride: 89 mmol/L — ABNORMAL LOW (ref 98–111)
Creatinine, Ser: 0.97 mg/dL (ref 0.44–1.00)
GFR, Estimated: >60 mL/min (ref >60)
Glucose, Bld: 446 mg/dL — ABNORMAL HIGH (ref 70–99)
Potassium: 2.9 mmol/L — ABNORMAL LOW (ref 3.5–5.1)
Sodium: 129 mmol/L — ABNORMAL LOW (ref 135–145)

## 2023-01-08 LAB — CBC WITH DIFFERENTIAL/PLATELET
Abs Immature Granulocytes: 0.07 10*3/uL (ref 0.00–0.07)
Basophils Absolute: 0.1 10*3/uL (ref 0.0–0.1)
Basophils Relative: 1 %
Eosinophils Absolute: 0 10*3/uL (ref 0.0–0.5)
Eosinophils Relative: 0 %
HCT: 41.8 % (ref 36.0–46.0)
Hemoglobin: 14.2 g/dL (ref 12.0–15.0)
Immature Granulocytes: 1 %
Lymphocytes Relative: 10 %
Lymphs Abs: 1.4 10*3/uL (ref 0.7–4.0)
MCH: 29.2 pg (ref 26.0–34.0)
MCHC: 34 g/dL (ref 30.0–36.0)
MCV: 85.8 fL (ref 80.0–100.0)
Monocytes Absolute: 1 10*3/uL (ref 0.1–1.0)
Monocytes Relative: 8 %
Neutro Abs: 10.8 10*3/uL — ABNORMAL HIGH (ref 1.7–7.7)
Neutrophils Relative %: 80 %
Platelets: 277 10*3/uL (ref 150–400)
RBC: 4.87 MIL/uL (ref 3.87–5.11)
RDW: 12.3 % (ref 11.5–15.5)
WBC: 13.3 10*3/uL — ABNORMAL HIGH (ref 4.0–10.5)
nRBC: 0 % (ref 0.0–0.2)

## 2023-01-08 LAB — LIPASE, BLOOD: Lipase: 25 U/L (ref 11–51)

## 2023-01-08 MED ORDER — ACETAMINOPHEN 500 MG PO TABS
1000.0000 mg | ORAL_TABLET | Freq: Once | ORAL | Status: AC
  Administered 2023-01-08: 1000 mg via ORAL
  Filled 2023-01-08: qty 2

## 2023-01-08 MED ORDER — POTASSIUM CHLORIDE ER 10 MEQ PO TBCR: 10.0000 meq | EXTENDED_RELEASE_TABLET | Freq: Every day | ORAL | 0 refills | Status: DC

## 2023-01-08 MED ORDER — CEPHALEXIN 500 MG PO CAPS: 500.0000 mg | ORAL_CAPSULE | Freq: Three times a day (TID) | ORAL | 0 refills | Status: DC

## 2023-01-08 MED ORDER — HYDROMORPHONE HCL 1 MG/ML IJ SOLN
1.0000 mg | Freq: Once | INTRAMUSCULAR | Status: AC
  Administered 2023-01-08: 1 mg via INTRAVENOUS
  Filled 2023-01-08: qty 1

## 2023-01-08 MED ORDER — POTASSIUM CHLORIDE CRYS ER 20 MEQ PO TBCR
40.0000 meq | EXTENDED_RELEASE_TABLET | Freq: Once | ORAL | Status: AC
  Administered 2023-01-08: 40 meq via ORAL
  Filled 2023-01-08: qty 2

## 2023-01-08 MED ORDER — POTASSIUM CHLORIDE 10 MEQ/100ML IV SOLN
10.0000 meq | Freq: Once | INTRAVENOUS | Status: AC
  Administered 2023-01-08: 10 meq via INTRAVENOUS
  Filled 2023-01-08: qty 100

## 2023-01-08 MED ORDER — ONDANSETRON HCL 4 MG/2ML IJ SOLN
4.0000 mg | Freq: Once | INTRAMUSCULAR | Status: AC
  Administered 2023-01-08: 4 mg via INTRAVENOUS
  Filled 2023-01-08: qty 2

## 2023-01-08 MED ORDER — SODIUM CHLORIDE 0.9 % IV SOLN
2.0000 g | Freq: Once | INTRAVENOUS | Status: AC
  Administered 2023-01-08: 2 g via INTRAVENOUS
  Filled 2023-01-08: qty 20

## 2023-01-08 MED ORDER — SODIUM CHLORIDE 0.9 % IV BOLUS
1000.0000 mL | Freq: Once | INTRAVENOUS | Status: AC
  Administered 2023-01-08: 1000 mL via INTRAVENOUS

## 2023-01-08 MED ORDER — INSULIN ASPART 100 UNIT/ML IJ SOLN
5.0000 [IU] | Freq: Once | INTRAMUSCULAR | Status: AC
  Administered 2023-01-08: 5 [IU] via SUBCUTANEOUS
  Filled 2023-01-08: qty 0.05

## 2023-01-08 MED ORDER — IOHEXOL 300 MG/ML  SOLN
100.0000 mL | Freq: Once | INTRAMUSCULAR | Status: AC | PRN
  Administered 2023-01-08: 100 mL via INTRAVENOUS

## 2023-01-08 NOTE — ED Notes (Signed)
Contacted main lab to collect urine culture from urine sent down.

## 2023-01-08 NOTE — ED Triage Notes (Signed)
2nd week of rt flank pain, painful urination, today noted fever 102/3 in triage

## 2023-01-08 NOTE — ED Provider Notes (Signed)
[place] [facility] AT [facility] Provider Note   CSN: [id number] Arrival date & time: 01/08/23  1236     History  Chief Complaint  Patient presents with   Fever   rt flank pain   painful urination    Nancy Pham is a 57 y.o. female w/ hx of obesity presenting to ED with right flank pain x 2 weeks, initially intermittent but now constant, nausea, no vomiting, regular BM.  Painful, burning urination.  No hx of kidney stones.  Hx of cholecystectomy and hysterectomy.  HPI     Home Medications Prior to Admission medications   Medication Sig Start Date End Date Taking? Authorizing Provider  cephALEXin (KEFLEX) 500 MG capsule Take 1 capsule (500 mg total) by mouth 3 (three) times daily for 9 days. 01/09/23 01/18/23 Yes [place] Lemaster, Kermit Balo, MD  potassium chloride (KLOR-CON) 10 MEQ tablet Take 1 tablet (10 mEq total) by mouth daily for 30 doses. 01/08/23 02/07/23 Yes [place] Yanik, Kermit Balo, MD  acetaminophen (TYLENOL) 325 MG tablet Take 325-650 mg by mouth every 6 (six) hours as needed for mild pain, fever or headache.     [provider]  albuterol (VENTOLIN HFA) 108 (90 Base) MCG/ACT inhaler Inhale 1-2 puffs into the lungs every 6 (six) hours as needed for wheezing or shortness of breath. Please dispense 3 months supple. Patient not taking: Reported on 12/28/2022 04/16/20   [name], FNP  allopurinol (ZYLOPRIM) 300 MG tablet TAKE ONE TABLET BY MOUTH ONE TIME DAILY Patient not taking: Reported on 12/28/2022 04/29/22   Ivonne Andrew, NP  amLODipine (NORVASC) 5 MG tablet Take 1 tablet (5 mg total) by mouth daily. 12/14/22 03/14/23  Donell Beers, FNP  aspirin EC 81 MG tablet Take 1 tablet (81 mg total) by mouth daily. 08/15/19   [name], FNP  atenolol (TENORMIN) 100 MG tablet Take 1 tablet (100 mg total) by mouth daily. 12/14/22   Donell Beers, FNP  cetirizine (ZYRTEC) 10 MG tablet Take 10 mg by mouth daily.    [provider]   chlorthalidone (HYGROTON) 25 MG tablet Take 1 tablet (25 mg total) by mouth daily. 12/14/22   Donell Beers, FNP  Cholecalciferol (VITAMIN D3) 125 MCG (5000 UT) CAPS Take 1 capsule (5,000 Units total) by mouth daily. Patient not taking: Reported on 12/28/2022 02/12/19   [name], FNP  cyclobenzaprine (FLEXERIL) 10 MG tablet Take 1 tablet (10 mg total) by mouth 3 (three) times daily as needed for muscle spasms. TAKE ONE TABLET BY MOUTH THREE TIMES DAILY AS NEEDED FOR MUSCLE SPASMS. 06/10/21   Barbette Merino, NP  Dulaglutide (TRULICITY) 0.75 MG/0.5ML SOPN Inject 0.75 mg into the skin once a week. 01/03/23   Donell Beers, FNP  famotidine (PEPCID) 20 MG tablet Take 1 tablet (20 mg total) by mouth daily. 03/11/21   Barbette Merino, NP  ferrous sulfate 325 (65 FE) MG tablet Take 1 tablet (325 mg total) by mouth daily with breakfast. 04/16/20   [name], FNP  fluticasone [facility]) 50 MCG/ACT nasal spray Place 2 sprays into both nostrils daily as needed for allergies or rhinitis. Patient not taking: Reported on 12/23/2022 04/16/20   [name], FNP  gabapentin (NEURONTIN) 100 MG capsule TAKE ONE CAPSULE BY MOUTH THREE TIMES A DAY 04/29/22   Ivonne Andrew, NP  glimepiride (AMARYL) 4 MG tablet Take 1 tablet (4 mg total) by mouth daily before breakfast. 12/14/22  Donell Beers, FNP  hydrocortisone cream (HYDROCORTISONE ANTI-ITCH) 1 % Apply 1 application topically 2 (two) times daily. Patient not taking: Reported on 12/14/2022 04/16/20   [name], FNP  Inulin (FIBER CHOICE PO) Take 1 capsule by mouth daily.    [provider]  meloxicam (MOBIC) 15 MG tablet TAKE ONE TABLET BY MOUTH EVERY DAY AS NEEDED FOR PAIN 03/11/21   Barbette Merino, NP  Multiple Vitamins-Minerals (CENTRUM SILVER 50+WOMEN) TABS Take 1 tablet by mouth daily. 06/11/21   Barbette Merino, NP  Omega-3 Fatty Acids (FISH OIL PO) Take 1 capsule by mouth daily.    [provider]   Probiotic Product (PROBIOTIC DAILY PO) Take 1 capsule by mouth daily.    [provider]  rosuvastatin (CRESTOR) 40 MG tablet Take 1 tablet (40 mg total) by mouth daily. 12/14/22   Donell Beers, FNP  triamcinolone cream (KENALOG) 0.1 % Apply 1 application topically 2 (two) times daily. Patient not taking: Reported on 09/16/2021 08/15/19   [name], FNP  Liraglutide (VICTOZA) 18 MG/3ML SOLN injection Inject 1.8 mLs (10.8 mg total) into the skin daily. 03/21/17 09/14/19  Shaune Pollack, MD      Allergies    Ace inhibitors and Tape    Review of Systems   Review of Systems  Physical Exam Updated Vital Signs BP 136/65   Pulse 74   Temp 98.6 F (37 C) (Oral)   Resp 18   Ht 5\' 6"  (1.676 m)   Wt 133.8 kg   SpO2 97%   BMI 47.61 kg/m  Physical Exam Constitutional:      General: She is not in acute distress.    Appearance: She is obese.  HENT:     Head: Normocephalic and atraumatic.  Eyes:     Conjunctiva/sclera: Conjunctivae normal.     Pupils: Pupils are equal, round, and reactive to light.  Cardiovascular:     Rate and Rhythm: Regular rhythm. Tachycardia present.  Pulmonary:     Effort: Pulmonary effort is normal. No respiratory distress.  Abdominal:     General: There is no distension.     Tenderness: There is no abdominal tenderness. There is right CVA tenderness. There is no left CVA tenderness.  Skin:    General: Skin is warm and dry.  Neurological:     General: No focal deficit present.     Mental Status: She is alert. Mental status is at baseline.  Psychiatric:        Mood and Affect: Mood normal.        Behavior: Behavior normal.     ED Results / Procedures / Treatments   Labs (all labs ordered are listed, but only abnormal results are displayed) Labs Reviewed  URINALYSIS, ROUTINE W REFLEX MICROSCOPIC - Abnormal; Notable for the following components:      Result Value   APPearance CLOUDY (*)    Glucose, UA >=500 (*)    Hgb urine dipstick  MODERATE (*)    Ketones, ur 20 (*)    Protein, ur 100 (*)    Leukocytes,Ua MODERATE (*)    Bacteria, UA RARE (*)    All other components within normal limits  BASIC METABOLIC PANEL - Abnormal; Notable for the following components:   Sodium 129 (*)    Potassium 2.9 (*)    Chloride 89 (*)    Glucose, Bld 446 (*)    All other components within normal limits  CBC WITH DIFFERENTIAL/PLATELET - Abnormal; Notable  for the following components:   WBC 13.3 (*)    Neutro Abs 10.8 (*)    All other components within normal limits  HEPATIC FUNCTION PANEL - Abnormal; Notable for the following components:   Total Protein 8.5 (*)    Albumin 3.2 (*)    AST 43 (*)    All other components within normal limits  URINE CULTURE  LIPASE, BLOOD    EKG None  Radiology CT ABDOMEN PELVIS W CONTRAST  Result Date: 01/08/2023 CLINICAL DATA:  UTI, recurrent/complicated (Female) right pyelonephritis EXAM: CT ABDOMEN AND PELVIS WITH CONTRAST TECHNIQUE: Multidetector CT imaging of the abdomen and pelvis was performed using the standard protocol following bolus administration of intravenous contrast. RADIATION DOSE REDUCTION: This exam was performed according to the departmental dose-optimization program which includes automated exposure control, adjustment of the mA and/or kV according to patient size and/or use of iterative reconstruction technique. CONTRAST:  OMNIPAQUE IOHEXOL 300 MG/ML  SOLN COMPARISON:  None Available. FINDINGS: Lower chest: Included lung bases are clear.  Heart size is normal. Hepatobiliary: Mildly decreased attenuation of the hepatic parenchyma. No focal liver lesion identified. Prior cholecystectomy. No biliary dilatation. Pancreas: Unremarkable. No pancreatic ductal dilatation or surrounding inflammatory changes. Spleen: Normal in size without focal abnormality. Adrenals/Urinary Tract: Bilateral adrenal glands and left kidney are normal in appearance. Striated nephrogram of the right kidney  with perinephric fat stranding. Urothelial thickening and enhancement of the right ureter and right renal pelvis. No intrarenal fluid collection. No stone or hydronephrosis. Mild urinary bladder wall thickening. Stomach/Bowel: Stomach is within normal limits. Appendix appears normal. No evidence of bowel wall thickening, distention, or inflammatory changes. Vascular/Lymphatic: No significant vascular findings are present. No enlarged abdominal or pelvic lymph nodes. Reproductive: Status post hysterectomy. No adnexal masses. Other: No free fluid. No abdominopelvic fluid collection. No pneumoperitoneum. Inferior rectus diastasis with fat and non compromised loops of small bowel. There are left paraumbilical and supraumbilical hernias containing only fat. Musculoskeletal: No acute or significant osseous findings. IMPRESSION: 1. Striated nephrogram of the right kidney with perinephric fat stranding, consistent with acute pyelonephritis. 2. Mild urinary bladder wall thickening, suggestive of cystitis. 3. Inferior rectus diastasis with fat and non-compromised loops of small bowel. 4. Left paraumbilical and supraumbilical hernias containing only fat. 5. Mild hepatic steatosis. Electronically Signed   By: Duanne Guess D.O.   On: 01/08/2023 17:17    Procedures .Critical Care  Performed by: Terald Sleeper, MD Authorized by: Terald Sleeper, MD   Critical care provider statement:    Critical care time (minutes):  30   Critical care time was exclusive of:  Separately billable procedures and treating other patients   Critical care was necessary to treat or prevent imminent or life-threatening deterioration of the following conditions:  Metabolic crisis   Critical care was time spent personally by me on the following activities:  Ordering and performing treatments and interventions, ordering and review of laboratory studies, ordering and review of radiographic studies, pulse oximetry, review of old charts,  examination of patient and evaluation of patient's response to treatment Comments:     IV K repletion     Medications Ordered in ED Medications  HYDROmorphone (DILAUDID) injection 1 mg (1 mg Intravenous Given 01/08/23 1405)  sodium chloride 0.9 % bolus 1,000 mL (0 mLs Intravenous Stopped 01/08/23 1558)  acetaminophen (TYLENOL) tablet 1,000 mg (1,000 mg Oral Given 01/08/23 1405)  ondansetron (ZOFRAN) injection 4 mg (4 mg Intravenous Given 01/08/23 1405)  cefTRIAXone (ROCEPHIN) 2 g  in sodium chloride 0.9 % 100 mL IVPB (0 g Intravenous Stopped 01/08/23 1546)  insulin aspart (novoLOG) injection 5 Units (5 Units Subcutaneous Given 01/08/23 1645)  potassium chloride 10 mEq in 100 mL IVPB (0 mEq Intravenous Stopped 01/08/23 1736)  potassium chloride SA (KLOR-CON M) CR tablet 40 mEq (40 mEq Oral Given 01/08/23 1557)  iohexol (OMNIPAQUE) 300 MG/ML solution 100 mL (100 mLs Intravenous Contrast Given 01/08/23 1637)    ED Course/ Medical Decision Making/ A&P                             Medical Decision Making Amount and/or Complexity of Data Reviewed Labs: ordered. Radiology: ordered.  Risk OTC drugs. Prescription drug management.   This patient presents to the ED with concern for flank pain. This involves an extensive number of treatment options, and is a complaint that carries with it a high risk of complications and morbidity.  The differential diagnosis includes UTI versus abdominal process versus other  I ordered and personally interpreted labs.  The pertinent results include:  13.3 WBC, UA with moderate leukocytes, many white blood cells and bacteria, moderate hemoglobin.  Potassium 2.9.  I ordered imaging studies including CT abdomen pelvis I independently visualized and interpreted imaging which showed acute right-sided pyelonephritis, incidental hernias noted I agree with the radiologist interpretation  The patient was maintained on a cardiac monitor.  I personally viewed and interpreted  the cardiac monitored which showed an underlying rhythm of: NSR  I ordered medication including IV antibiotics, IV pain medications, IV potassium repletion  After the interventions noted above, I reevaluated the patient and found that they have: improved   Dispostion:  After consideration of the diagnostic results and the patients response to treatment, I feel that the patent would benefit from close outpatient follow-up.  Hospitalization was considered.  However, overall given her clinical stable status, improved pain, reasonable outpatient treatment with antibiotics, I think it is reasonable to discharge her as well.  Patient is comfortable with this plan.  Return precautions discussed.         Final Clinical Impression(s) / ED Diagnoses Final diagnoses:  Pyelonephritis  Hypokalemia    Rx / DC Orders ED Discharge Orders          Ordered    cephALEXin (KEFLEX) 500 MG capsule  3 times daily        01/08/23 1827    potassium chloride (KLOR-CON) 10 MEQ tablet  Daily        01/08/23 1827              Terald Sleeper, MD 01/08/23 2033

## 2023-01-08 NOTE — Discharge Instructions (Addendum)
Please follow-up with your doctors office this week for reassessment of your symptoms.  You should begin feeling much better within the next 1 to 2 days with antibiotics for a kidney infection.  If you are feeling worse, particularly if you are having fevers, shaking chills, lightheadedness, persistent vomiting, or any other emergency concerns, please return to the ER.

## 2023-01-09 LAB — URINE CULTURE: Culture: 60000 — AB

## 2023-01-10 LAB — URINE CULTURE

## 2023-01-11 ENCOUNTER — Ambulatory Visit (INDEPENDENT_AMBULATORY_CARE_PROVIDER_SITE_OTHER): Payer: Self-pay | Admitting: Nurse Practitioner

## 2023-01-11 ENCOUNTER — Emergency Department (HOSPITAL_COMMUNITY): Payer: 59

## 2023-01-11 ENCOUNTER — Encounter: Payer: Self-pay | Admitting: Nurse Practitioner

## 2023-01-11 ENCOUNTER — Emergency Department (HOSPITAL_COMMUNITY)
Admission: EM | Admit: 2023-01-11 | Discharge: 2023-01-11 | Disposition: A | Discharge status: Home / Self Care | Payer: 59 | Attending: Emergency Medicine | Admitting: Emergency Medicine

## 2023-01-11 ENCOUNTER — Other Ambulatory Visit: Payer: Self-pay

## 2023-01-11 VITALS — BP 155/89 | HR 92 | Temp 97.8°F | Ht 66.0 in | Wt 306.0 lb

## 2023-01-11 DIAGNOSIS — M5442 Lumbago with sciatica, left side: Secondary | ICD-10-CM

## 2023-01-11 DIAGNOSIS — E1165 Type 2 diabetes mellitus with hyperglycemia: Secondary | ICD-10-CM

## 2023-01-11 DIAGNOSIS — N12 Tubulo-interstitial nephritis, not specified as acute or chronic: Secondary | ICD-10-CM | POA: Insufficient documentation

## 2023-01-11 DIAGNOSIS — Z7984 Long term (current) use of oral hypoglycemic drugs: Secondary | ICD-10-CM | POA: Diagnosis not present

## 2023-01-11 DIAGNOSIS — Z79899 Other long term (current) drug therapy: Secondary | ICD-10-CM | POA: Insufficient documentation

## 2023-01-11 DIAGNOSIS — Z7982 Long term (current) use of aspirin: Secondary | ICD-10-CM | POA: Insufficient documentation

## 2023-01-11 DIAGNOSIS — E119 Type 2 diabetes mellitus without complications: Secondary | ICD-10-CM | POA: Insufficient documentation

## 2023-01-11 DIAGNOSIS — E876 Hypokalemia: Secondary | ICD-10-CM | POA: Insufficient documentation

## 2023-01-11 DIAGNOSIS — Z09 Encounter for follow-up examination after completed treatment for conditions other than malignant neoplasm: Secondary | ICD-10-CM

## 2023-01-11 DIAGNOSIS — R109 Unspecified abdominal pain: Secondary | ICD-10-CM | POA: Diagnosis present

## 2023-01-11 DIAGNOSIS — G8929 Other chronic pain: Secondary | ICD-10-CM

## 2023-01-11 DIAGNOSIS — M5441 Lumbago with sciatica, right side: Secondary | ICD-10-CM

## 2023-01-11 DIAGNOSIS — I10 Essential (primary) hypertension: Secondary | ICD-10-CM | POA: Insufficient documentation

## 2023-01-11 LAB — CBC
HCT: 41.3 % (ref 36.0–46.0)
Hemoglobin: 13.4 g/dL (ref 12.0–15.0)
MCH: 28.2 pg (ref 26.0–34.0)
MCHC: 32.4 g/dL (ref 30.0–36.0)
MCV: 86.9 fL (ref 80.0–100.0)
Platelets: 327 10*3/uL (ref 150–400)
RBC: 4.75 MIL/uL (ref 3.87–5.11)
RDW: 12.2 % (ref 11.5–15.5)
WBC: 7.5 10*3/uL (ref 4.0–10.5)
nRBC: 0 % (ref 0.0–0.2)

## 2023-01-11 LAB — BASIC METABOLIC PANEL
Anion gap: 13 (ref 5–15)
BUN: 13 mg/dL (ref 6–20)
CO2: 26 mmol/L (ref 22–32)
Calcium: 8.7 mg/dL — ABNORMAL LOW (ref 8.9–10.3)
Chloride: 93 mmol/L — ABNORMAL LOW (ref 98–111)
Creatinine, Ser: 0.75 mg/dL (ref 0.44–1.00)
GFR, Estimated: >60 mL/min (ref >60)
Glucose, Bld: 386 mg/dL — ABNORMAL HIGH (ref 70–99)
Potassium: 3.2 mmol/L — ABNORMAL LOW (ref 3.5–5.1)
Sodium: 132 mmol/L — ABNORMAL LOW (ref 135–145)

## 2023-01-11 LAB — URINALYSIS, ROUTINE W REFLEX MICROSCOPIC
Bacteria, UA: NONE SEEN
Bilirubin Urine: NEGATIVE
Glucose, UA: >=500 mg/dL — AB
Hgb urine dipstick: NEGATIVE
Ketones, ur: 20 mg/dL — AB
Leukocytes,Ua: NEGATIVE
Nitrite: NEGATIVE
Protein, ur: 100 mg/dL — AB
Specific Gravity, Urine: 1.025 (ref 1.005–1.030)
pH: 6 (ref 5.0–8.0)

## 2023-01-11 MED ORDER — CIPROFLOXACIN HCL 500 MG PO TABS
500.0000 mg | ORAL_TABLET | Freq: Once | ORAL | Status: AC
  Administered 2023-01-11: 500 mg via ORAL
  Filled 2023-01-11: qty 1

## 2023-01-11 MED ORDER — KETOROLAC TROMETHAMINE 30 MG/ML IJ SOLN
30.0000 mg | Freq: Once | INTRAMUSCULAR | Status: AC
  Administered 2023-01-11: 30 mg via INTRAMUSCULAR
  Filled 2023-01-11: qty 1

## 2023-01-11 MED ORDER — CIPROFLOXACIN HCL 500 MG PO TABS: 500.0000 mg | ORAL_TABLET | Freq: Two times a day (BID) | ORAL | 0 refills | Status: AC

## 2023-01-11 MED ORDER — ATENOLOL 100 MG PO TABS: 100.0000 mg | ORAL_TABLET | Freq: Every day | ORAL | 0 refills | Status: DC

## 2023-01-11 MED ORDER — GABAPENTIN 100 MG PO CAPS: 100.0000 mg | ORAL_CAPSULE | Freq: Three times a day (TID) | ORAL | 3 refills | Status: DC

## 2023-01-11 MED ORDER — IOHEXOL 300 MG/ML  SOLN
100.0000 mL | Freq: Once | INTRAMUSCULAR | Status: AC | PRN
  Administered 2023-01-11: 100 mL via INTRAVENOUS

## 2023-01-11 MED ORDER — MELOXICAM 7.5 MG PO TABS: 7.5000 mg | ORAL_TABLET | Freq: Every day | ORAL | 0 refills | Status: DC

## 2023-01-11 MED ORDER — GLIMEPIRIDE 4 MG PO TABS: 4.0000 mg | ORAL_TABLET | Freq: Every day | ORAL | 0 refills | Status: DC

## 2023-01-11 NOTE — Discharge Instructions (Addendum)
As discussed, imaging revealed no new injury to your kidneys.  Your pain is most likely to Keflex penetrating to your kidney. Therefore, stop Keflex and start Ciprofloxacin 500 mg twice a day for 7 days.  You received your first dose here in the ED tonight.  Prescription of given for acute pain. Continue Tylenol as needed for pain and fever.  It would take roughly 48 hours with antibiotic take again.  Take full course of medication.  Do not stop it early even if you start feeling better to ensure resolution of infection.  Follow-up with your primary care in 1 to 2 days for reevaluation.

## 2023-01-11 NOTE — Assessment & Plan Note (Signed)
Lab Results  Component Value Date   NA 129 (L) 01/08/2023   K 2.9 (L) 01/08/2023   CO2 25 01/08/2023   GLUCOSE 446 (H) 01/08/2023   BUN 13 01/08/2023   CREATININE 0.97 01/08/2023   CALCIUM 9.0 01/08/2023   EGFR 91 12/28/2022   GFRNONAA >60 01/08/2023   Currently on potassium 10 mEq daily Not able to check labs as the patient is going to the emergency department Continue current medication

## 2023-01-11 NOTE — Assessment & Plan Note (Signed)
BP Readings from Last 3 Encounters:  01/11/23 (!) 155/89  01/08/23 136/65  12/28/22 131/66  Blood pressure is elevated in the office today patient reports severe flank pain, this is mostly contributing to her elevated BP.  Reports taking all her blood pressure medication as ordered, stated that she has been out of atenolol 100 mg daily refilled Continue current medications. No changes in management. Discussed DASH diet and dietary sodium restrictions Continue to increase dietary efforts and exercise.

## 2023-01-11 NOTE — Patient Instructions (Signed)
We will get back to you about the prescription for trulicity. I have refilled your atenolol and gabapentin Please take all your medications as ordered Sending you to the emergency room for your pyelonephritis    It is important that you exercise regularly at least 30 minutes 5 times a week as tolerated  Think about what you will eat, plan ahead. Choose " clean, green, fresh or frozen" over canned, processed or packaged foods which are more sugary, salty and fatty. 70 to 75% of food eaten should be vegetables and fruit. Three meals at set times with snacks allowed between meals, but they must be fruit or vegetables. Aim to eat over a 12 hour period , example 7 am to 7 pm, and STOP after  your last meal of the day. Drink water,generally about 64 ounces per day, no other drink is as healthy. Fruit juice is best enjoyed in a healthy way, by EATING the fruit.  Thanks for choosing [facility] we consider it a privelige to serve you.

## 2023-01-11 NOTE — ED Provider Notes (Signed)
[place] EMERGENCY DEPARTMENT AT [facility] Provider Note   CSN: [id number] Arrival date & time: 01/11/23  1415     History  Chief Complaint  Patient presents with   Flank Pain    Nancy Pham is a 57 y.o. female with a history of hypertension, hyperlipidemia, diabetes mellitus who presents to the ED for persistent right flank pain.  Patient was seen in the ED 3 days ago for the same complaint and was diagnosed with pyelonephritis.  She was given 2 g Ceftriaxone in the ED and prescribed Keflex 3 times a day upon discharge. She reports taking the medication as prescribed and has not missed any doses. Patient states that she has had a fever since Saturday with a T-max of 102 F for which she is taking Tylenol. Last dose was at 7 AM this morning. No urinary symptoms at this time. No other complaints or concerns at this time.    Home Medications Prior to Admission medications   Medication Sig Start Date End Date Taking? Authorizing Provider  ciprofloxacin (CIPRO) 500 MG tablet Take 1 tablet (500 mg total) by mouth 2 (two) times daily for 7 days. 01/11/23 01/18/23 Yes Maxwell Marion, PA-C  meloxicam (MOBIC) 7.5 MG tablet Take 1 tablet (7.5 mg total) by mouth daily for 5 days. 01/11/23 01/16/23 Yes Maxwell Marion, PA-C  acetaminophen (TYLENOL) 325 MG tablet Take 325-650 mg by mouth every 6 (six) hours as needed for mild pain, fever or headache.     [provider]  albuterol (VENTOLIN HFA) 108 (90 Base) MCG/ACT inhaler Inhale 1-2 puffs into the lungs every 6 (six) hours as needed for wheezing or shortness of breath. Please dispense 3 months supple. Patient not taking: Reported on 12/28/2022 04/16/20   [name], FNP  allopurinol (ZYLOPRIM) 300 MG tablet TAKE ONE TABLET BY MOUTH ONE TIME DAILY 04/29/22   Ivonne Andrew, NP  amLODipine (NORVASC) 5 MG tablet Take 1 tablet (5 mg total) by mouth daily. 12/14/22 03/14/23  Donell Beers, FNP  aspirin EC 81 MG tablet Take 1  tablet (81 mg total) by mouth daily. 08/15/19   [name], FNP  atenolol (TENORMIN) 100 MG tablet Take 1 tablet (100 mg total) by mouth daily. 01/11/23   Donell Beers, FNP  cetirizine (ZYRTEC) 10 MG tablet Take 10 mg by mouth daily.    [provider]  chlorthalidone (HYGROTON) 25 MG tablet Take 1 tablet (25 mg total) by mouth daily. 12/14/22   Donell Beers, FNP  Cholecalciferol (VITAMIN D3) 125 MCG (5000 UT) CAPS Take 1 capsule (5,000 Units total) by mouth daily. Patient not taking: Reported on 12/28/2022 02/12/19   [name], FNP  cyclobenzaprine (FLEXERIL) 10 MG tablet Take 1 tablet (10 mg total) by mouth 3 (three) times daily as needed for muscle spasms. TAKE ONE TABLET BY MOUTH THREE TIMES DAILY AS NEEDED FOR MUSCLE SPASMS. Patient not taking: Reported on 01/11/2023 06/10/21   Barbette Merino, NP  Dulaglutide (TRULICITY) 0.75 MG/0.5ML SOPN Inject 0.75 mg into the skin once a week. Patient not taking: Reported on 01/11/2023 01/03/23   Donell Beers, FNP  famotidine (PEPCID) 20 MG tablet Take 1 tablet (20 mg total) by mouth daily. 03/11/21   Barbette Merino, NP  ferrous sulfate 325 (65 FE) MG tablet Take 1 tablet (325 mg total) by mouth daily with breakfast. 04/16/20   [name], FNP  fluticasone [facility]) 50 MCG/ACT nasal spray Place 2 sprays  into both nostrils daily as needed for allergies or rhinitis. Patient not taking: Reported on 12/23/2022 04/16/20   [name], FNP  gabapentin (NEURONTIN) 100 MG capsule Take 1 capsule (100 mg total) by mouth 3 (three) times daily. 01/11/23   Paseda, Baird Kay, FNP  glimepiride (AMARYL) 4 MG tablet Take 1 tablet (4 mg total) by mouth daily before breakfast. 01/11/23   Paseda, Baird Kay, FNP  hydrocortisone cream (HYDROCORTISONE ANTI-ITCH) 1 % Apply 1 application topically 2 (two) times daily. Patient not taking: Reported on 12/14/2022 04/16/20   [name], FNP  Inulin (FIBER CHOICE PO) Take 1 capsule  by mouth daily.    [provider]  Multiple Vitamins-Minerals (CENTRUM SILVER 50+WOMEN) TABS Take 1 tablet by mouth daily. 06/11/21   Barbette Merino, NP  Omega-3 Fatty Acids (FISH OIL PO) Take 1 capsule by mouth daily.    [provider]  potassium chloride (KLOR-CON) 10 MEQ tablet Take 1 tablet (10 mEq total) by mouth daily for 30 doses. 01/08/23 02/07/23  Terald Sleeper, MD  Probiotic Product (PROBIOTIC DAILY PO) Take 1 capsule by mouth daily.    [provider]  rosuvastatin (CRESTOR) 40 MG tablet Take 1 tablet (40 mg total) by mouth daily. 12/14/22   Donell Beers, FNP  triamcinolone cream (KENALOG) 0.1 % Apply 1 application topically 2 (two) times daily. Patient not taking: Reported on 09/16/2021 08/15/19   [name], FNP  Liraglutide (VICTOZA) 18 MG/3ML SOLN injection Inject 1.8 mLs (10.8 mg total) into the skin daily. 03/21/17 09/14/19  Shaune Pollack, MD      Allergies    Ace inhibitors and Tape    Review of Systems   Review of Systems  Genitourinary:  Positive for flank pain.       Right sided  All other systems reviewed and are negative.   Physical Exam Updated Vital Signs BP (!) 156/76   Pulse 78   Temp 98.3 F (36.8 C) (Oral)   Resp 16   Ht 5\' 6"  (1.676 m)   Wt 130 kg   SpO2 100%   BMI 46.26 kg/m  Physical Exam Vitals and nursing note reviewed.  Constitutional:      Appearance: Normal appearance.  HENT:     Head: Normocephalic and atraumatic.     Mouth/Throat:     Mouth: Mucous membranes are moist.  Eyes:     Conjunctiva/sclera: Conjunctivae normal.     Pupils: Pupils are equal, round, and reactive to light.  Cardiovascular:     Rate and Rhythm: Normal rate and regular rhythm.     Pulses: Normal pulses.  Pulmonary:     Effort: Pulmonary effort is normal.     Breath sounds: Normal breath sounds.  Abdominal:     Palpations: Abdomen is soft.     Tenderness: There is abdominal tenderness. There is right CVA tenderness.      Comments: Right lower quadrant tenderness to palpation  Musculoskeletal:        General: Normal range of motion.     Cervical back: Normal range of motion.  Skin:    General: Skin is warm and dry.     Findings: No rash.  Neurological:     General: No focal deficit present.     Mental Status: She is alert.  Psychiatric:        Mood and Affect: Mood normal.        Behavior: Behavior normal.     ED Results /  Procedures / Treatments   Labs (all labs ordered are listed, but only abnormal results are displayed) Labs Reviewed  URINALYSIS, ROUTINE W REFLEX MICROSCOPIC - Abnormal; Notable for the following components:      Result Value   Glucose, UA >=500 (*)    Ketones, ur 20 (*)    Protein, ur 100 (*)    All other components within normal limits  BASIC METABOLIC PANEL - Abnormal; Notable for the following components:   Sodium 132 (*)    Potassium 3.2 (*)    Chloride 93 (*)    Glucose, Bld 386 (*)    Calcium 8.7 (*)    All other components within normal limits  CBC    EKG None  Radiology CT ABDOMEN W CONTRAST  Result Date: 01/11/2023 CLINICAL DATA:  Acute abdominal pain and flank pain. EXAM: CT ABDOMEN WITH CONTRAST TECHNIQUE: Multidetector CT imaging of the abdomen was performed using the standard protocol following bolus administration of intravenous contrast. RADIATION DOSE REDUCTION: This exam was performed according to the departmental dose-optimization program which includes automated exposure control, adjustment of the mA and/or kV according to patient size and/or use of iterative reconstruction technique. CONTRAST:  OMNIPAQUE IOHEXOL 300 MG/ML  SOLN COMPARISON:  CT abdomen and pelvis 01/08/2023. FINDINGS: Lower chest: No acute abnormality. Hepatobiliary: No focal liver abnormality is seen. Status post cholecystectomy. No biliary dilatation. Pancreas: Unremarkable. No pancreatic ductal dilatation or surrounding inflammatory changes. Spleen: Normal in size without focal  abnormality. Adrenals/Urinary Tract: Striated nephrogram of the right kidney persists similar to the prior study. There is no perinephric fluid collection or hydronephrosis. The left kidney appears within normal limits. The adrenal glands are within normal limits. Stomach/Bowel: No dilated bowel loops or free air. Vascular/Lymphatic: Visualized aorta and IVC are within normal limits. No enlarged lymph nodes. Other: Small fat containing midline ventral hernia appears unchanged. Minimal subcutaneous stranding in the anterior left abdominal wall appears unchanged. No free fluid or free air. Musculoskeletal: No acute or significant osseous findings. IMPRESSION: 1. Persistent striated nephrogram of the right kidney compatible with pyelonephritis. No hydronephrosis or perinephric fluid collection. 2. Stable small fat containing ventral hernia. Electronically Signed   By: Darliss Cheney M.D.   On: 01/11/2023 18:43    Procedures Procedures: not indicated.   Medications Ordered in ED Medications  ketorolac (TORADOL) 30 MG/ML injection 30 mg (30 mg Intramuscular Given 01/11/23 1627)  iohexol (OMNIPAQUE) 300 MG/ML solution 100 mL (100 mLs Intravenous Contrast Given 01/11/23 1749)  ciprofloxacin (CIPRO) tablet 500 mg (500 mg Oral Given 01/11/23 1951)    ED Course/ Medical Decision Making/ A&P                             Medical Decision Making Amount and/or Complexity of Data Reviewed Labs: ordered.  Risk Prescription drug management.   This patient presents to the ED for concern of right flank pain, this involves an extensive number of treatment options, and is a complaint that carries with it a high risk of complications and morbidity.   Differential diagnosis includes: Pyelonephritis versus kidney stone vs renal abscess, etc.   Co morbidities that complicate the patient evaluation  Diabetes mellitus Hypertension   Additional history obtained:  Additional history obtained from patient's  records.   Lab Tests:  I ordered and personally interpreted labs.  The pertinent results include:   Potassium of 3.2 on BMP, improved from last visit. She is currently taking oral  Potassium supplements daily CBC is unremarkable U/A shows no signs of bacteria, leukocyte esterase, or nitrites   Imaging Studies ordered:  I ordered imaging studies including Ct abdomen with contrast  I independently visualized and interpreted imaging which showed: 1. Striated nephrogram of the right kidney with perinephric fat stranding, consistent with acute pyelonephritis. 2. Mild urinary bladder wall thickening, suggestive of cystitis. 3. Inferior rectus diastasis with fat and non-compromised loops of small bowel. 4. Left paraumbilical and supraumbilical hernias containing only fat. 5. Mild hepatic steatosis. I agree with the radiologist interpretation   Problem List / ED Course / Critical interventions / Medication management  Right-sided abdominal flank pain I ordered medications including:  Toradol for pain  Reevaluation of the patient after these medicines showed that the patient improved I have reviewed the patients home medicines and have made adjustments as needed   Social Determinants of Health:  Social connection Housing   Test / Admission - Considered:  Patient is stable and safe for discharge home. Stop Keflex and start Cipro for increased kidney penetration. Strict return precautions given.        Final Clinical Impression(s) / ED Diagnoses Final diagnoses:  Pyelonephritis  Right flank pain    Rx / DC Orders ED Discharge Orders          Ordered    ciprofloxacin (CIPRO) 500 MG tablet  2 times daily        01/11/23 1921    meloxicam (MOBIC) 7.5 MG tablet  Daily        01/11/23 1951              Maxwell Marion, PA-C 01/11/23 2043    Glyn Ade, MD 01/13/23 1441

## 2023-01-11 NOTE — Assessment & Plan Note (Signed)
Currently on Keflex 500 mg 3 times daily Reports severe flank pain fever Patient taken to the emergency room by the CMA Urine culture positive for E. Coli CT scan results below 1. Striated nephrogram of the right kidney with perinephric fat stranding, consistent with acute pyelonephritis. 2. Mild urinary bladder wall thickening, suggestive of cystitis. 3. Inferior rectus diastasis with fat and non-compromised loops of small bowel. 4. Left paraumbilical and supraumbilical hernias containing only fat. 5. Mild hepatic steatosis

## 2023-01-11 NOTE — ED Triage Notes (Signed)
Pt states has been taking antibiotics for kidney infection for few days and is still having right flank pain.

## 2023-01-11 NOTE — Assessment & Plan Note (Signed)
No improvement since she was evaluated at the emergency department 3 days ago Diagnosed with acute pyelonephritis taking Keflex but continues to have severe flank pain Patient sent to the emergency department

## 2023-01-11 NOTE — ED Notes (Signed)
RN to room , introduced self to pt , pt denies any needs at this time, RR even and unlabored, rails x2 , call light in reach, pt on bp cuff and pulse ox

## 2023-01-11 NOTE — Assessment & Plan Note (Signed)
Currently on glimepiride 4 mg daily Prior authorization for Trulicity is pending checking on the status of the prior authorization today

## 2023-01-11 NOTE — Progress Notes (Signed)
Established Patient Office Visit  Subjective:  Patient ID: Nancy Pham, female    DOB: 19-Oct-1965  Age: 57 y.o. MRN: [id number]  CC:  Chief Complaint  Patient presents with   Follow-up    Hospital follow up.    HPI Nancy Pham is a 57 y.o. female  has a past medical history of Asthma, Cancer (HCC), Diabetes mellitus, Gout, Hyperlipidemia, and Hypertension.   Patient was at the emergency department on 01/08/2023 with complaints of right flank pain for 2 weeks, she was diagnosed with pyelonephritis and sent home on Keflex 500 mg 3 times daily potassium chloride daily also ordered for hypokalemia.  In the hospital the patient received IV Rocephin prior to discharge for her pyelonephritis..  Patient stated that her flank pain has not improved currently has pain rated 11/10, ''never ending pain''.  Taking Keflex as ordered, reports fever of 102 at home for the past 2 days.  Still making urine denies bloody urine.     Past Medical History:  Diagnosis Date   Asthma    Cancer (HCC)    Diabetes mellitus    Gout    Hyperlipidemia    Hypertension     Past Surgical History:  Procedure Laterality Date   ABDOMINAL HYSTERECTOMY      Family History  Problem Relation Age of Onset   Seizures Mother    Heart attack Father    Heart attack Sister    Hypertension Sister    Hypertension Brother    Hypertension Brother    Hypertension Brother    Hypertension Brother    Hypertension Sister     Social History   Socioeconomic History   Marital status: Single    Spouse name: Not on file   Number of children: Not on file   Years of education: Not on file   Highest education level: Not on file  Occupational History   Not on file  Tobacco Use   Smoking status: Never   Smokeless tobacco: Never  Vaping Use   Vaping Use: Never used  Substance and Sexual Activity   Alcohol use: No   Drug use: No   Sexual activity: Not Currently  Other Topics Concern   Not on file  Social  History Narrative   Not on file   Social Determinants of Health   Financial Resource Strain: Not on file  Food Insecurity: Not on file  Transportation Needs: Not on file  Physical Activity: Not on file  Stress: Not on file  Social Connections: Not on file  Intimate Partner Violence: Not on file    Facility-Administered Medications Prior to Visit  Medication Dose Route Frequency Provider Last Rate Last Admin   cloNIDine (CATAPRES) tablet 0.2 mg  0.2 mg Oral Once Barbette Merino, NP       Outpatient Medications Prior to Visit  Medication Sig Dispense Refill   acetaminophen (TYLENOL) 325 MG tablet Take 325-650 mg by mouth every 6 (six) hours as needed for mild pain, fever or headache.      allopurinol (ZYLOPRIM) 300 MG tablet TAKE ONE TABLET BY MOUTH ONE TIME DAILY 90 tablet 3   amLODipine (NORVASC) 5 MG tablet Take 1 tablet (5 mg total) by mouth daily. 90 tablet 1   aspirin EC 81 MG tablet Take 1 tablet (81 mg total) by mouth daily. 90 tablet 1   cephALEXin (KEFLEX) 500 MG capsule Take 1 capsule (500 mg total) by mouth 3 (three) times daily for 9 days. 27 capsule  0   cetirizine (ZYRTEC) 10 MG tablet Take 10 mg by mouth daily.     chlorthalidone (HYGROTON) 25 MG tablet Take 1 tablet (25 mg total) by mouth daily. 90 tablet 1   famotidine (PEPCID) 20 MG tablet Take 1 tablet (20 mg total) by mouth daily. 90 tablet 3   ferrous sulfate 325 (65 FE) MG tablet Take 1 tablet (325 mg total) by mouth daily with breakfast. 90 tablet 3   Omega-3 Fatty Acids (FISH OIL PO) Take 1 capsule by mouth daily.     potassium chloride (KLOR-CON) 10 MEQ tablet Take 1 tablet (10 mEq total) by mouth daily for 30 doses. 30 tablet 0   Probiotic Product (PROBIOTIC DAILY PO) Take 1 capsule by mouth daily.     rosuvastatin (CRESTOR) 40 MG tablet Take 1 tablet (40 mg total) by mouth daily. 90 tablet 0   gabapentin (NEURONTIN) 100 MG capsule TAKE ONE CAPSULE BY MOUTH THREE TIMES A DAY 270 capsule 3   glimepiride  (AMARYL) 4 MG tablet Take 1 tablet (4 mg total) by mouth daily before breakfast. 60 tablet 0   albuterol (VENTOLIN HFA) 108 (90 Base) MCG/ACT inhaler Inhale 1-2 puffs into the lungs every 6 (six) hours as needed for wheezing or shortness of breath. Please dispense 3 months supple. (Patient not taking: Reported on 12/28/2022) 8 g 11   Cholecalciferol (VITAMIN D3) 125 MCG (5000 UT) CAPS Take 1 capsule (5,000 Units total) by mouth daily. (Patient not taking: Reported on 12/28/2022) 15 capsule 1   cyclobenzaprine (FLEXERIL) 10 MG tablet Take 1 tablet (10 mg total) by mouth 3 (three) times daily as needed for muscle spasms. TAKE ONE TABLET BY MOUTH THREE TIMES DAILY AS NEEDED FOR MUSCLE SPASMS. (Patient not taking: Reported on 01/11/2023) 90 tablet 2   Dulaglutide (TRULICITY) 0.75 MG/0.5ML SOPN Inject 0.75 mg into the skin once a week. (Patient not taking: Reported on 01/11/2023) 2 mL 0   fluticasone (FLONASE) 50 MCG/ACT nasal spray Place 2 sprays into both nostrils daily as needed for allergies or rhinitis. (Patient not taking: Reported on 12/23/2022) 11.1 mL 11   hydrocortisone cream (HYDROCORTISONE ANTI-ITCH) 1 % Apply 1 application topically 2 (two) times daily. (Patient not taking: Reported on 12/14/2022) 30 g 3   Inulin (FIBER CHOICE PO) Take 1 capsule by mouth daily.     meloxicam (MOBIC) 15 MG tablet TAKE ONE TABLET BY MOUTH EVERY DAY AS NEEDED FOR PAIN (Patient not taking: Reported on 01/11/2023) 60 tablet 2   Multiple Vitamins-Minerals (CENTRUM SILVER 50+WOMEN) TABS Take 1 tablet by mouth daily. 90 tablet 1   triamcinolone cream (KENALOG) 0.1 % Apply 1 application topically 2 (two) times daily. (Patient not taking: Reported on 09/16/2021) 30 g 6   atenolol (TENORMIN) 100 MG tablet Take 1 tablet (100 mg total) by mouth daily. (Patient not taking: Reported on 01/11/2023) 90 tablet 0    Allergies  Allergen Reactions   Ace Inhibitors Swelling    Swelling of the lips   Tape Rash    No Band-Aids!!     ROS Review of Systems  Constitutional:  Positive for fever. Negative for diaphoresis and unexpected weight change.  HENT: Negative.  Negative for congestion, dental problem, drooling and ear discharge.   Respiratory:  Negative for apnea, choking, chest tightness and shortness of breath.   Cardiovascular:  Positive for leg swelling. Negative for chest pain and palpitations.  Gastrointestinal:  Negative for abdominal distention, abdominal pain and anal bleeding.  Genitourinary:  Positive  for flank pain. Negative for hematuria.  Musculoskeletal:  Positive for arthralgias.  Neurological:  Negative for dizziness, facial asymmetry, light-headedness and headaches.  Psychiatric/Behavioral:  Negative for agitation, behavioral problems, confusion and decreased concentration.       Objective:    Physical Exam Constitutional:      General: She is in acute distress.     Appearance: She is ill-appearing.  Cardiovascular:     Rate and Rhythm: Normal rate and regular rhythm.     Pulses: Normal pulses.     Heart sounds: Normal heart sounds. No murmur heard.    No friction rub. No gallop.  Pulmonary:     Effort: Pulmonary effort is normal. No respiratory distress.     Breath sounds: Normal breath sounds. No stridor. No wheezing, rhonchi or rales.  Chest:     Chest wall: No tenderness.  Abdominal:     Tenderness: There is right CVA tenderness.  Skin:    General: Skin is warm and dry.     Capillary Refill: Capillary refill takes less than 2 seconds.  Neurological:     Mental Status: She is alert and oriented to person, place, and time.     Gait: Gait abnormal.     Comments: Using a cane for ambulation  Psychiatric:        Thought Content: Thought content normal.     BP (!) 155/89   Pulse 92   Temp 97.8 F (36.6 C)   Ht 5\' 6"  (1.676 m)   Wt (!) 306 lb (138.8 kg)   SpO2 99%   BMI 49.39 kg/m  Wt Readings from Last 3 Encounters:  01/11/23 (!) 306 lb (138.8 kg)  01/08/23 295 lb  (133.8 kg)  12/14/22 (!) 306 lb 6.4 oz (139 kg)    Lab Results  Component Value Date   TSH 1.680 12/14/2022   Lab Results  Component Value Date   WBC 13.3 (H) 01/08/2023   HGB 14.2 01/08/2023   HCT 41.8 01/08/2023   MCV 85.8 01/08/2023   PLT 277 01/08/2023   Lab Results  Component Value Date   NA 129 (L) 01/08/2023   K 2.9 (L) 01/08/2023   CO2 25 01/08/2023   GLUCOSE 446 (H) 01/08/2023   BUN 13 01/08/2023   CREATININE 0.97 01/08/2023   BILITOT 1.0 01/08/2023   ALKPHOS 102 01/08/2023   AST 43 (H) 01/08/2023   ALT 32 01/08/2023   PROT 8.5 (H) 01/08/2023   ALBUMIN 3.2 (L) 01/08/2023   CALCIUM 9.0 01/08/2023   ANIONGAP 15 01/08/2023   EGFR 91 12/28/2022   Lab Results  Component Value Date   CHOL 189 06/10/2021   Lab Results  Component Value Date   HDL 49 06/10/2021   Lab Results  Component Value Date   LDLCALC 98 06/10/2021   Lab Results  Component Value Date   TRIG 247 (H) 06/10/2021   Lab Results  Component Value Date   CHOLHDL 3.9 06/10/2021   Lab Results  Component Value Date   HGBA1C 13.9 (A) 12/14/2022      Assessment & Plan:   Problem List Items Addressed This Visit       Cardiovascular and Mediastinum   Hypertension    BP Readings from Last 3 Encounters:  01/11/23 (!) 155/89  01/08/23 136/65  12/28/22 131/66  Blood pressure is elevated in the office today patient reports severe flank pain, this is mostly contributing to her elevated BP.  Reports taking all her blood pressure medication as  ordered, stated that she has been out of atenolol 100 mg daily refilled Continue current medications. No changes in management. Discussed DASH diet and dietary sodium restrictions Continue to increase dietary efforts and exercise.         Relevant Medications   atenolol (TENORMIN) 100 MG tablet     Endocrine   Uncontrolled type 2 diabetes mellitus with hyperglycemia (HCC)    Currently on glimepiride 4 mg daily Prior authorization for Trulicity  is pending checking on the status of the prior authorization today      Relevant Medications   glimepiride (AMARYL) 4 MG tablet     Genitourinary   Pyelonephritis - Primary    Currently on Keflex 500 mg 3 times daily Reports severe flank pain fever Patient taken to the emergency room by the CMA Urine culture positive for E. Coli CT scan results below 1. Striated nephrogram of the right kidney with perinephric fat stranding, consistent with acute pyelonephritis. 2. Mild urinary bladder wall thickening, suggestive of cystitis. 3. Inferior rectus diastasis with fat and non-compromised loops of small bowel. 4. Left paraumbilical and supraumbilical hernias containing only fat. 5. Mild hepatic steatosis        Other   Encounter for examination following treatment at hospital    No improvement since she was evaluated at the emergency department 3 days ago Diagnosed with acute pyelonephritis taking Keflex but continues to have severe flank pain Patient sent to the emergency department      Hypokalemia    Lab Results  Component Value Date   NA 129 (L) 01/08/2023   K 2.9 (L) 01/08/2023   CO2 25 01/08/2023   GLUCOSE 446 (H) 01/08/2023   BUN 13 01/08/2023   CREATININE 0.97 01/08/2023   CALCIUM 9.0 01/08/2023   EGFR 91 12/28/2022   GFRNONAA >60 01/08/2023  Currently on potassium 10 mEq daily Not able to check labs as the patient is going to the emergency department Continue current medication      Other Visit Diagnoses     Chronic bilateral low back pain with bilateral sciatica       Relevant Medications   gabapentin (NEURONTIN) 100 MG capsule       Meds ordered this encounter  Medications   gabapentin (NEURONTIN) 100 MG capsule    Sig: Take 1 capsule (100 mg total) by mouth 3 (three) times daily.    Dispense:  270 capsule    Refill:  3   atenolol (TENORMIN) 100 MG tablet    Sig: Take 1 tablet (100 mg total) by mouth daily.    Dispense:  90 tablet    Refill:  0    glimepiride (AMARYL) 4 MG tablet    Sig: Take 1 tablet (4 mg total) by mouth daily before breakfast.    Dispense:  60 tablet    Refill:  0    Follow-up: No follow-ups on file.    Donell Beers, FNP

## 2023-01-11 NOTE — Telephone Encounter (Signed)
Post ED Visit - Positive Culture Follow-up  Culture report reviewed by antimicrobial stewardship pharmacist: [facility] []  Enzo Bi, Pharm.D. []  Celedonio Miyamoto, Pharm.D., BCPS AQ-ID []  Garvin Fila, Pharm.D., BCPS []  Georgina Pillion, Pharm.D., BCPS []  Blaine, [address].D., BCPS, AAHIVP []  Estella Husk, Pharm.D., BCPS, AAHIVP []  Lysle Pearl, PharmD, BCPS []  Phillips Climes, PharmD, BCPS []  Agapito Games, PharmD, BCPS []  Verlan Friends, PharmD []  Mervyn Gay, PharmD, BCPS []  Vinnie Level, PharmD  [facility] [x]  Sharin Mons, PharmD []  Greer Pickerel, PharmD []  Adalberto Cole, PharmD []  Perlie Gold, Rph []  Lonell Face) Jean Rosenthal, PharmD []  Earl Many, PharmD []  Junita Push, PharmD []  Dorna Leitz, PharmD []  Terrilee Files, PharmD []  Lynann Beaver, PharmD []  Keturah Barre, PharmD []  Loralee Pacas, PharmD []  Bernadene Person, PharmD   Positive Urine culture Treated with Cephalexin, organism sensitive to the same and no further patient follow-up is required at this time.  Bing Quarry 01/11/2023, 8:27 AM

## 2023-01-12 ENCOUNTER — Other Ambulatory Visit: Payer: Self-pay

## 2023-01-12 ENCOUNTER — Telehealth: Payer: Self-pay

## 2023-01-12 NOTE — Telephone Encounter (Signed)
Approved until 01/11/2024

## 2023-01-12 NOTE — Telephone Encounter (Signed)
Left a voicemail for the patient today, 12pm, to notify that prior Berkley Harvey has been approved but [facility] is not contracted with her insurance carrier. Per [facility], [facility] or mail order would most likely be [employer]'s preferred pharmacy.   Unable to speak to patient to see which pharmacy would be closest to her/preferred and left on the message that she can contact the filling pharmacy of her choice and have them transfer the prescription to them.

## 2023-01-12 NOTE — Telephone Encounter (Signed)
A prior authorization request for Trulicity has been submitted to insurance today via CoverMyMeds Key: [id number]

## 2023-01-13 NOTE — Telephone Encounter (Signed)
Pt was advised [facility] 

## 2023-01-14 ENCOUNTER — Other Ambulatory Visit: Payer: Self-pay

## 2023-01-14 ENCOUNTER — Telehealth: Payer: Self-pay

## 2023-01-14 DIAGNOSIS — M1A09X0 Idiopathic chronic gout, multiple sites, without tophus (tophi): Secondary | ICD-10-CM

## 2023-01-14 MED ORDER — ALLOPURINOL 300 MG PO TABS: 300.0000 mg | ORAL_TABLET | Freq: Every day | ORAL | 3 refills | Status: DC

## 2023-01-14 MED ORDER — MELOXICAM 7.5 MG PO TABS: 7.5000 mg | ORAL_TABLET | Freq: Every day | ORAL | 0 refills | Status: AC

## 2023-01-14 NOTE — Transitions of Care (Post Inpatient/ED Visit) (Cosign Needed)
01/14/2023  Name: Nancy Pham MRN: [id number] DOB: February 11, 1966  Today's TOC FU Call Status: Today's TOC FU Call Status:: Successful TOC FU Call Competed TOC FU Call Complete Date: 01/14/23  Transition Care Management Follow-up Telephone Call Date of Discharge: 01/11/23 Discharge Facility: [facility]) Type of Discharge: Emergency Department Reason for ED Visit: Renal How have you been since you were released from the hospital?: Better Any questions or concerns?: No  Items Reviewed: Did you receive and understand the discharge instructions provided?: Yes Medications obtained,verified, and reconciled?: Yes (Medications Reviewed) Any new allergies since your discharge?: No Dietary orders reviewed?: NA Do you have support at home?: Yes  Medications Reviewed Today: Medications Reviewed Today     Reviewed by Renelda Loma, RMA (Registered Medical Assistant) on 01/14/23 at 1400  Med List Status: <None>   Medication Order Taking? Sig Documenting Provider Last Dose Status Informant  acetaminophen (TYLENOL) 325 MG tablet [id number] Yes Take 325-650 mg by mouth every 6 (six) hours as needed for mild pain, fever or headache.  [provider] Taking Active Self           Med Note Clearance Coots, Alanda Slim Dec 23, 2022 10:24 AM)    albuterol (VENTOLIN HFA) 108 (90 Base) MCG/ACT inhaler [id number] No Inhale 1-2 puffs into the lungs every 6 (six) hours as needed for wheezing or shortness of breath. Please dispense 3 months supple.  Patient not taking: Reported on 12/28/2022   [name], FNP Not Taking Active            Med Note Merla Riches Dec 23, 2022 10:25 AM)    allopurinol (ZYLOPRIM) 300 MG tablet [id number] Yes TAKE ONE TABLET BY MOUTH ONE TIME DAILY Ivonne Andrew, NP Taking Active   amLODipine (NORVASC) 5 MG tablet [id number] Yes Take 1 tablet (5 mg total) by mouth daily. Donell Beers, FNP Taking Active   aspirin EC 81 MG tablet [id number] Yes  Take 1 tablet (81 mg total) by mouth daily. [name], FNP Taking Active   atenolol (TENORMIN) 100 MG tablet [id number] Yes Take 1 tablet (100 mg total) by mouth daily. Donell Beers, FNP Taking Active   cetirizine (ZYRTEC) 10 MG tablet [id number] Yes Take 10 mg by mouth daily. [provider] Taking Active   chlorthalidone (HYGROTON) 25 MG tablet [id number] Yes Take 1 tablet (25 mg total) by mouth daily. Donell Beers, FNP Taking Active   Cholecalciferol (VITAMIN D3) 125 MCG (5000 UT) CAPS [id number] Yes Take 1 capsule (5,000 Units total) by mouth daily. [name], FNP Taking Active   ciprofloxacin (CIPRO) 500 MG tablet [id number] Yes Take 1 tablet (500 mg total) by mouth 2 (two) times daily for 7 days. Maxwell Marion, PA-C Taking Active   cloNIDine (CATAPRES) tablet 0.2 mg [id number]   Barbette Merino, NP  Active   cyclobenzaprine (FLEXERIL) 10 MG tablet [id number] No Take 1 tablet (10 mg total) by mouth 3 (three) times daily as needed for muscle spasms. TAKE ONE TABLET BY MOUTH THREE TIMES DAILY AS NEEDED FOR MUSCLE SPASMS.  Patient not taking: Reported on 01/11/2023   Barbette Merino, NP Not Taking Active   Dulaglutide (TRULICITY) 0.75 MG/0.5ML SOPN [id number] No Inject 0.75 mg into the skin once a week.  Patient not taking: Reported on 01/11/2023   Donell Beers, FNP Not Taking Active   famotidine (PEPCID) 20 MG tablet [id number] Yes Take 1 tablet (20 mg  total) by mouth daily. Barbette Merino, NP Taking Active   ferrous sulfate 325 (65 FE) MG tablet [id number] Yes Take 1 tablet (325 mg total) by mouth daily with breakfast. [name], FNP Taking Active   fluticasone [facility]) 50 MCG/ACT nasal spray [id number] No Place 2 sprays into both nostrils daily as needed for allergies or rhinitis.  Patient not taking: Reported on 12/23/2022   [name], FNP Not Taking Active            Med Note Clearance Coots, Alanda Slim Dec 23, 2022 10:24 AM)    gabapentin  (NEURONTIN) 100 MG capsule [id number] Yes Take 1 capsule (100 mg total) by mouth 3 (three) times daily. Donell Beers, FNP Taking Active   glimepiride (AMARYL) 4 MG tablet [id number] Yes Take 1 tablet (4 mg total) by mouth daily before breakfast. Donell Beers, FNP Taking Active   hydrocortisone cream (HYDROCORTISONE ANTI-ITCH) 1 % [id number] No Apply 1 application topically 2 (two) times daily.  Patient not taking: Reported on 12/14/2022   [name], FNP Not Taking Active   Inulin (FIBER CHOICE PO) [id number] Yes Take 1 capsule by mouth daily. [provider] Taking Active     Discontinued 08/15/19 1518          Med Note Antony Madura, Dyane Dustman Aug 21, 2017  4:16 PM) Regimen verified by the patient  meloxicam (MOBIC) 7.5 MG tablet [id number] Yes Take 1 tablet (7.5 mg total) by mouth daily for 5 days. Maxwell Marion, PA-C Taking Active   Multiple Vitamins-Minerals (CENTRUM SILVER 50+WOMEN) TABS [id number] Yes Take 1 tablet by mouth daily. Barbette Merino, NP Taking Active   Omega-3 Fatty Acids (FISH OIL PO) [id number] Yes Take 1 capsule by mouth daily. [provider] Taking Active   potassium chloride (KLOR-CON) 10 MEQ tablet [id number] Yes Take 1 tablet (10 mEq total) by mouth daily for 30 doses. Terald Sleeper, MD Taking Active   Probiotic Product (PROBIOTIC DAILY PO) [id number] Yes Take 1 capsule by mouth daily. [provider] Taking Active Self  rosuvastatin (CRESTOR) 40 MG tablet [id number] Yes Take 1 tablet (40 mg total) by mouth daily. Donell Beers, FNP Taking Active   triamcinolone cream (KENALOG) 0.1 % [id number] No Apply 1 application topically 2 (two) times daily.  Patient not taking: Reported on 09/16/2021   [name], FNP Not Taking Active            Med Note [name]   Fri Sep 14, 2019  2:06 PM) As needed.            Home Care and Equipment/Supplies: Were Home Health Services Ordered?: NA Any new equipment or  medical supplies ordered?: NA  Functional Questionnaire: Do you need assistance with bathing/showering or dressing?: No Do you need assistance with meal preparation?: No Do you need assistance with eating?: No Do you need assistance with getting out of bed/getting out of a chair/moving?: No Do you have difficulty managing or taking your medications?: No  Follow up appointments reviewed: PCP Follow-up appointment confirmed?: Yes Date of PCP follow-up appointment?: 01/25/23 Follow-up Provider: [facility] Follow-up appointment confirmed?: NA Do you need transportation to your follow-up appointment?: No Do you understand care options if your condition(s) worsen?: Yes-patient verbalized understanding    SIGNATURE. Renelda Loma RMA

## 2023-01-14 NOTE — Telephone Encounter (Signed)
Please advise K[facility] 

## 2023-01-25 ENCOUNTER — Encounter: Payer: Self-pay | Admitting: Nurse Practitioner

## 2023-01-25 ENCOUNTER — Ambulatory Visit (INDEPENDENT_AMBULATORY_CARE_PROVIDER_SITE_OTHER): Payer: 59 | Admitting: Nurse Practitioner

## 2023-01-25 VITALS — BP 128/68 | HR 59 | Temp 97.2°F

## 2023-01-25 DIAGNOSIS — Z1231 Encounter for screening mammogram for malignant neoplasm of breast: Secondary | ICD-10-CM

## 2023-01-25 DIAGNOSIS — Z1211 Encounter for screening for malignant neoplasm of colon: Secondary | ICD-10-CM

## 2023-01-25 DIAGNOSIS — G8929 Other chronic pain: Secondary | ICD-10-CM | POA: Diagnosis not present

## 2023-01-25 DIAGNOSIS — M1A09X0 Idiopathic chronic gout, multiple sites, without tophus (tophi): Secondary | ICD-10-CM

## 2023-01-25 DIAGNOSIS — E1165 Type 2 diabetes mellitus with hyperglycemia: Secondary | ICD-10-CM

## 2023-01-25 DIAGNOSIS — I10 Essential (primary) hypertension: Secondary | ICD-10-CM

## 2023-01-25 DIAGNOSIS — Z8541 Personal history of malignant neoplasm of cervix uteri: Secondary | ICD-10-CM

## 2023-01-25 DIAGNOSIS — E785 Hyperlipidemia, unspecified: Secondary | ICD-10-CM

## 2023-01-25 DIAGNOSIS — E876 Hypokalemia: Secondary | ICD-10-CM

## 2023-01-25 DIAGNOSIS — M25561 Pain in right knee: Secondary | ICD-10-CM

## 2023-01-25 MED ORDER — ROSUVASTATIN CALCIUM 40 MG PO TABS: 40.0000 mg | ORAL_TABLET | Freq: Every day | ORAL | 1 refills | Status: DC

## 2023-01-25 MED ORDER — TRULICITY 0.75 MG/0.5ML ~~LOC~~ SOAJ
0.7500 mg | SUBCUTANEOUS | 0 refills | Status: DC
Start: 2023-01-25 — End: 2023-02-21

## 2023-01-25 MED ORDER — ALLOPURINOL 300 MG PO TABS: 300.0000 mg | ORAL_TABLET | Freq: Every day | ORAL | 3 refills | Status: DC

## 2023-01-25 MED ORDER — MELOXICAM 15 MG PO TABS
15.0000 mg | ORAL_TABLET | Freq: Every day | ORAL | 0 refills | Status: DC | PRN
Start: 2023-01-25 — End: 2023-02-21

## 2023-01-25 NOTE — Assessment & Plan Note (Signed)
Lab Results  Component Value Date   NA 132 (L) 01/11/2023   K 3.2 (L) 01/11/2023   CO2 26 01/11/2023   GLUCOSE 386 (H) 01/11/2023   BUN 13 01/11/2023   CREATININE 0.75 01/11/2023   CALCIUM 8.7 (L) 01/11/2023   EGFR 91 12/28/2022   GFRNONAA >60 01/11/2023  Patient is currently on [place] 25 mg daily, this could be contributing to her hypokalemia. Checking BMP today Continue potassium 10 mEq daily

## 2023-01-25 NOTE — Assessment & Plan Note (Signed)
Allopurinol 300 mg daily refilled No acute gout flareup at this time

## 2023-01-25 NOTE — Progress Notes (Signed)
Established Patient Office Visit  Subjective:  Patient ID: Nancy Pham, female    DOB: 1965-12-22  Age: 57 y.o. MRN: [id number]  CC:  Chief Complaint  Patient presents with   Follow-up    Hospital follow up.    HPI Nancy Pham is a 57 y.o. female  has a past medical history of Asthma, Cancer (HCC), Chronic pain of right knee, Diabetes mellitus, Gout, History of cervical cancer, History of ovarian cancer, Hyperlipidemia, Hyperlipidemia LDL goal <70, Hypertension, and Obesity.  Patient presents for hospital follow-up.  She was sent to  the emergency department for persistent right flank pain due  UTI on 01/11/2023.  Patient stated that she is doing well today.  No fever, chills, no urinary symptoms, no flank pain.   Hypertension.  Currently on amlodipine 5 mg daily, atenolol 100 mg daily, chlorthalidone 25 mg daily, no complaints of chest pain, dizziness, headaches.   Uncontrolled type 2 diabetes.  Currently on glimepiride 4 mg daily.  Trulicity has been approved but needs medication sent to [facility].  She does not check her blood sugars at home.  Signs of hypoglycemia reviewed.  Takes Crestor 40 mg daily for hyperlipidemia.   Chronic right knee pain.  Stated that she has had steroid injections in the past, takes OTC Tylenol as needed.  Gabapentin 100 mg 3 times daily. would like a prescription for meloxicam 15 mg tablets.  Currently has aching pain rated 8/10.  She is not looking into getting a steroid injection at this time due to her uncontrolled diabetes.   Has history of ovarian cancer and cervical cancer.  Stated that she has had a total hysterectomy, was on chemo therapy.  Completed chemo in 2010 at [place].  We discussed referral to OB/GYN for gynecologic exam.   Due for shingles vaccine and tetanus vaccine patient encouraged to get the vaccine at the pharmacy  Cologuard and mammogram ordered.       Past Medical History:  Diagnosis Date   Asthma    Cancer  (HCC)    Chronic pain of right knee    Diabetes mellitus    Gout    History of cervical cancer    History of ovarian cancer    Hyperlipidemia    Hyperlipidemia LDL goal <70    Hypertension    Obesity     Past Surgical History:  Procedure Laterality Date   ABDOMINAL HYSTERECTOMY      Family History  Problem Relation Age of Onset   Seizures Mother    Heart attack Father    Heart attack Sister    Hypertension Sister    Hypertension Brother    Hypertension Brother    Hypertension Brother    Hypertension Brother    Hypertension Sister     Social History   Socioeconomic History   Marital status: Single    Spouse name: Not on file   Number of children: Not on file   Years of education: Not on file   Highest education level: Not on file  Occupational History   Not on file  Tobacco Use   Smoking status: Never   Smokeless tobacco: Never  Vaping Use   Vaping Use: Never used  Substance and Sexual Activity   Alcohol use: No   Drug use: No   Sexual activity: Not Currently  Other Topics Concern   Not on file  Social History Narrative   Not on file   Social Determinants of Health   Financial  Resource Strain: Not on file  Food Insecurity: Not on file  Transportation Needs: Not on file  Physical Activity: Not on file  Stress: Not on file  Social Connections: Not on file  Intimate Partner Violence: Not on file    Outpatient Medications Prior to Visit  Medication Sig Dispense Refill   acetaminophen (TYLENOL) 325 MG tablet Take 325-650 mg by mouth every 6 (six) hours as needed for mild pain, fever or headache.      amLODipine (NORVASC) 5 MG tablet Take 1 tablet (5 mg total) by mouth daily. 90 tablet 1   aspirin EC 81 MG tablet Take 1 tablet (81 mg total) by mouth daily. 90 tablet 1   atenolol (TENORMIN) 100 MG tablet Take 1 tablet (100 mg total) by mouth daily. 90 tablet 0   cetirizine (ZYRTEC) 10 MG tablet Take 10 mg by mouth daily.     chlorthalidone (HYGROTON) 25  MG tablet Take 1 tablet (25 mg total) by mouth daily. 90 tablet 1   Cholecalciferol (VITAMIN D3) 125 MCG (5000 UT) CAPS Take 1 capsule (5,000 Units total) by mouth daily. 15 capsule 1   famotidine (PEPCID) 20 MG tablet Take 1 tablet (20 mg total) by mouth daily. 90 tablet 3   ferrous sulfate 325 (65 FE) MG tablet Take 1 tablet (325 mg total) by mouth daily with breakfast. 90 tablet 3   gabapentin (NEURONTIN) 100 MG capsule Take 1 capsule (100 mg total) by mouth 3 (three) times daily. 270 capsule 3   glimepiride (AMARYL) 4 MG tablet Take 1 tablet (4 mg total) by mouth daily before breakfast. 60 tablet 0   Inulin (FIBER CHOICE PO) Take 1 capsule by mouth daily.     Multiple Vitamins-Minerals (CENTRUM SILVER 50+WOMEN) TABS Take 1 tablet by mouth daily. 90 tablet 1   Omega-3 Fatty Acids (FISH OIL PO) Take 1 capsule by mouth daily.     potassium chloride (KLOR-CON) 10 MEQ tablet Take 1 tablet (10 mEq total) by mouth daily for 30 doses. 30 tablet 0   Probiotic Product (PROBIOTIC DAILY PO) Take 1 capsule by mouth daily.     allopurinol (ZYLOPRIM) 300 MG tablet Take 1 tablet (300 mg total) by mouth daily. 90 tablet 3   rosuvastatin (CRESTOR) 40 MG tablet Take 1 tablet (40 mg total) by mouth daily. 90 tablet 0   albuterol (VENTOLIN HFA) 108 (90 Base) MCG/ACT inhaler Inhale 1-2 puffs into the lungs every 6 (six) hours as needed for wheezing or shortness of breath. Please dispense 3 months supple. (Patient not taking: Reported on 12/28/2022) 8 g 11   cyclobenzaprine (FLEXERIL) 10 MG tablet Take 1 tablet (10 mg total) by mouth 3 (three) times daily as needed for muscle spasms. TAKE ONE TABLET BY MOUTH THREE TIMES DAILY AS NEEDED FOR MUSCLE SPASMS. (Patient not taking: Reported on 01/11/2023) 90 tablet 2   fluticasone (FLONASE) 50 MCG/ACT nasal spray Place 2 sprays into both nostrils daily as needed for allergies or rhinitis. (Patient not taking: Reported on 12/23/2022) 11.1 mL 11   hydrocortisone cream  (HYDROCORTISONE ANTI-ITCH) 1 % Apply 1 application topically 2 (two) times daily. (Patient not taking: Reported on 12/14/2022) 30 g 3   Dulaglutide (TRULICITY) 0.75 MG/0.5ML SOPN Inject 0.75 mg into the skin once a week. (Patient not taking: Reported on 01/11/2023) 2 mL 0   triamcinolone cream (KENALOG) 0.1 % Apply 1 application topically 2 (two) times daily. (Patient not taking: Reported on 09/16/2021) 30 g 6   Facility-Administered Medications  Prior to Visit  Medication Dose Route Frequency Provider Last Rate Last Admin   cloNIDine (CATAPRES) tablet 0.2 mg  0.2 mg Oral Once Barbette Merino, NP        Allergies  Allergen Reactions   Ace Inhibitors Swelling    Swelling of the lips   Tape Rash    No Band-Aids!!    ROS Review of Systems  Constitutional: Negative.   Respiratory:  Negative for apnea, chest tightness, shortness of breath and wheezing.   Cardiovascular:  Negative for chest pain, palpitations and leg swelling.  Gastrointestinal: Negative.  Negative for abdominal distention, abdominal pain and anal bleeding.  Genitourinary:  Negative for difficulty urinating, dyspareunia, dysuria, enuresis and flank pain.  Musculoskeletal:  Positive for arthralgias.  Skin:  Negative for color change, pallor and wound.  Neurological:  Negative for dizziness, facial asymmetry, light-headedness and headaches.  Psychiatric/Behavioral:  Negative for agitation, behavioral problems, self-injury and suicidal ideas.       Objective:    Physical Exam Vitals and nursing note reviewed.  Constitutional:      General: She is not in acute distress.    Appearance: Normal appearance. She is obese. She is not ill-appearing, toxic-appearing or diaphoretic.  HENT:     Mouth/Throat:     Mouth: Mucous membranes are moist.     Pharynx: Oropharynx is clear. No oropharyngeal exudate or posterior oropharyngeal erythema.  Eyes:     General: No scleral icterus.       Right eye: No discharge.        Left eye:  No discharge.     Extraocular Movements: Extraocular movements intact.     Conjunctiva/sclera: Conjunctivae normal.  Cardiovascular:     Rate and Rhythm: Normal rate and regular rhythm.     Pulses: Normal pulses.     Heart sounds: Normal heart sounds. No murmur heard.    No friction rub. No gallop.  Pulmonary:     Effort: Pulmonary effort is normal. No respiratory distress.     Breath sounds: Normal breath sounds. No stridor. No wheezing, rhonchi or rales.  Chest:     Chest wall: No tenderness.  Abdominal:     General: There is no distension.     Palpations: Abdomen is soft.     Tenderness: There is no abdominal tenderness. There is no right CVA tenderness, left CVA tenderness or guarding.  Musculoskeletal:        General: Tenderness present. No swelling, deformity or signs of injury.     Right lower leg: No edema.     Left lower leg: No edema.     Comments: Tenderness on palpation of right knee, skin warm and dry no swelling or redness noted.  Ambulates using a cane  Skin:    General: Skin is warm and dry.     Capillary Refill: Capillary refill takes less than 2 seconds.     Coloration: Skin is not jaundiced or pale.     Findings: No bruising, erythema or lesion.  Neurological:     Mental Status: She is alert and oriented to person, place, and time.     Motor: No weakness.     Coordination: Coordination normal.     Gait: Gait normal.  Psychiatric:        Mood and Affect: Mood normal.        Behavior: Behavior normal.        Thought Content: Thought content normal.        Judgment: Judgment  normal.     BP 128/68   Pulse (!) 59   Temp (!) 97.2 F (36.2 C)   SpO2 100%  Wt Readings from Last 3 Encounters:  01/11/23 286 lb 9.6 oz (130 kg)  01/11/23 (!) 306 lb (138.8 kg)  01/08/23 295 lb (133.8 kg)    Lab Results  Component Value Date   TSH 1.680 12/14/2022   Lab Results  Component Value Date   WBC 7.5 01/11/2023   HGB 13.4 01/11/2023   HCT 41.3 01/11/2023   MCV  86.9 01/11/2023   PLT 327 01/11/2023   Lab Results  Component Value Date   NA 132 (L) 01/11/2023   K 3.2 (L) 01/11/2023   CO2 26 01/11/2023   GLUCOSE 386 (H) 01/11/2023   BUN 13 01/11/2023   CREATININE 0.75 01/11/2023   BILITOT 1.0 01/08/2023   ALKPHOS 102 01/08/2023   AST 43 (H) 01/08/2023   ALT 32 01/08/2023   PROT 8.5 (H) 01/08/2023   ALBUMIN 3.2 (L) 01/08/2023   CALCIUM 8.7 (L) 01/11/2023   ANIONGAP 13 01/11/2023   EGFR 91 12/28/2022   Lab Results  Component Value Date   CHOL 189 06/10/2021   Lab Results  Component Value Date   HDL 49 06/10/2021   Lab Results  Component Value Date   LDLCALC 98 06/10/2021   Lab Results  Component Value Date   TRIG 247 (H) 06/10/2021   Lab Results  Component Value Date   CHOLHDL 3.9 06/10/2021   Lab Results  Component Value Date   HGBA1C 13.9 (A) 12/14/2022      Assessment & Plan:   Problem List Items Addressed This Visit       Cardiovascular and Mediastinum   Hypertension - Primary    BP Readings from Last 3 Encounters:  01/25/23 128/68  01/11/23 (!) 156/76  01/11/23 (!) 155/89   HTN Controlled on amlodipine 10 mg daily, atenolol 100 mg daily, Hygroton 25 mg daily Continue current medications. No changes in management. Discussed DASH diet and dietary sodium restrictions Continue to increase dietary efforts and exercise.         Relevant Medications   rosuvastatin (CRESTOR) 40 MG tablet   Other Relevant Orders   Basic Metabolic Panel   AMB Referral to Pharmacy Medication Management     Endocrine   Uncontrolled type 2 diabetes mellitus with hyperglycemia (HCC)    Lab Results  Component Value Date   HGBA1C 13.9 (A) 12/14/2022  Currently on glimepiride 4 mg daily Start Trulicity 0.75 mg once weekly injection, side effects of medication discussed Patient counseled on low-carb modified diet Signs of hypoglycemia discussed. Has a glucometer at home but does not check her blood sugar regularly Follow-up in  4 weeks Patient referred to the clinical pharmacist to assist with medication management and medication adherence      Relevant Medications   Dulaglutide (TRULICITY) 0.75 MG/0.5ML SOPN   rosuvastatin (CRESTOR) 40 MG tablet   Other Relevant Orders   AMB Referral to Pharmacy Medication Management     Other   Hyperlipidemia    Will check lipid panel at next visit Continue Crestor 40 mg daily LDL goal is less than 70      Relevant Medications   rosuvastatin (CRESTOR) 40 MG tablet   Gout    Allopurinol 300 mg daily refilled No acute gout flareup at this time      Relevant Medications   allopurinol (ZYLOPRIM) 300 MG tablet   Chronic pain of right knee  Toradol 30 mg IM injection given Start meloxicam 15 mg daily as needed alternates weeks Tylenol as needed Continue gabapentin 100 mg 3 times daily Application of heat or ice encouraged Does not want referral to orthopedics at this time Regular daily exercises encouraged      Relevant Medications   meloxicam (MOBIC) 15 MG tablet   Hypokalemia    Lab Results  Component Value Date   NA 132 (L) 01/11/2023   K 3.2 (L) 01/11/2023   CO2 26 01/11/2023   GLUCOSE 386 (H) 01/11/2023   BUN 13 01/11/2023   CREATININE 0.75 01/11/2023   CALCIUM 8.7 (L) 01/11/2023   EGFR 91 12/28/2022   GFRNONAA >60 01/11/2023  Patient is currently on Hygroton 25 mg daily, this could be contributing to her hypokalemia. Checking BMP today Continue potassium 10 mEq daily      Other Visit Diagnoses     History of cervical cancer       Relevant Orders   Ambulatory referral to Gynecology   Screening for colon cancer       Relevant Orders   Cologuard   Screening mammogram for breast cancer       Relevant Orders   MM 3D SCREENING MAMMOGRAM BILATERAL BREAST       Meds ordered this encounter  Medications   Dulaglutide (TRULICITY) 0.75 MG/0.5ML SOPN    Sig: Inject 0.75 mg into the skin once a week.    Dispense:  2 mL    Refill:  0    allopurinol (ZYLOPRIM) 300 MG tablet    Sig: Take 1 tablet (300 mg total) by mouth daily.    Dispense:  90 tablet    Refill:  3   meloxicam (MOBIC) 15 MG tablet    Sig: Take 1 tablet (15 mg total) by mouth daily as needed for pain.    Dispense:  30 tablet    Refill:  0   rosuvastatin (CRESTOR) 40 MG tablet    Sig: Take 1 tablet (40 mg total) by mouth daily.    Dispense:  90 tablet    Refill:  1    Follow-up: Return in about 4 weeks (around 02/22/2023) for DM.    Donell Beers, FNP

## 2023-01-25 NOTE — Assessment & Plan Note (Addendum)
Lab Results  Component Value Date   HGBA1C 13.9 (A) 12/14/2022  Currently on glimepiride 4 mg daily Start Trulicity 0.75 mg once weekly injection, side effects of medication discussed Patient counseled on low-carb modified diet Signs of hypoglycemia discussed. Has a glucometer at home but does not check her blood sugar regularly Follow-up in 4 weeks Patient referred to the clinical pharmacist to assist with medication management and medication adherence

## 2023-01-25 NOTE — Assessment & Plan Note (Addendum)
BP Readings from Last 3 Encounters:  01/25/23 128/68  01/11/23 (!) 156/76  01/11/23 (!) 155/89   HTN Controlled on amlodipine 10 mg daily, atenolol 100 mg daily, Hygroton 25 mg daily Continue current medications. No changes in management. Discussed DASH diet and dietary sodium restrictions Continue to increase dietary efforts and exercise.

## 2023-01-25 NOTE — Assessment & Plan Note (Signed)
Will check lipid panel at next visit Continue Crestor 40 mg daily LDL goal is less than 70

## 2023-01-25 NOTE — Assessment & Plan Note (Addendum)
Toradol 30 mg IM injection given Start meloxicam 15 mg daily as needed alternates weeks Tylenol as needed Continue gabapentin 100 mg 3 times daily Application of heat or ice encouraged Does not want referral to orthopedics at this time Regular daily exercises encouraged

## 2023-01-25 NOTE — Patient Instructions (Addendum)
You were given Toradol 30 mg injection for your knee pain Please call to your next appointment fasting so we can check your cholesterol levels  I have referred you to OB/GYN for gynecologic exam as discussed.    1. Primary hypertension  - Basic Metabolic Panel  2. Uncontrolled type 2 diabetes mellitus with hyperglycemia (HCC)  - Dulaglutide (TRULICITY) 0.75 MG/0.5ML SOPN; Inject 0.75 mg into the skin once a week.  Dispense: 2 mL; Refill: 0  3. Chronic gout of multiple sites, unspecified cause  - allopurinol (ZYLOPRIM) 300 MG tablet; Take 1 tablet (300 mg total) by mouth daily.  Dispense: 90 tablet; Refill: 3  4. Chronic pain of right knee  - meloxicam (MOBIC) 15 MG tablet; Take 1 tablet (15 mg total) by mouth daily as needed for pain.  Dispense: 30 tablet; Refill: 0  5. Hyperlipidemia, unspecified hyperlipidemia type  - rosuvastatin (CRESTOR) 40 MG tablet; Take 1 tablet (40 mg total) by mouth daily.  Dispense: 90 tablet; Refill: 1      It is important that you exercise regularly at least 30 minutes 5 times a week as tolerated  Think about what you will eat, plan ahead. Choose " clean, green, fresh or frozen" over canned, processed or packaged foods which are more sugary, salty and fatty. 70 to 75% of food eaten should be vegetables and fruit. Three meals at set times with snacks allowed between meals, but they must be fruit or vegetables. Aim to eat over a 12 hour period , example 7 am to 7 pm, and STOP after  your last meal of the day. Drink water,generally about 64 ounces per day, no other drink is as healthy. Fruit juice is best enjoyed in a healthy way, by EATING the fruit.  Thanks for choosing [facility] we consider it a privelige to serve you.

## 2023-01-26 ENCOUNTER — Other Ambulatory Visit: Payer: Self-pay | Admitting: Nurse Practitioner

## 2023-01-26 DIAGNOSIS — E876 Hypokalemia: Secondary | ICD-10-CM

## 2023-01-26 LAB — BASIC METABOLIC PANEL
BUN/Creatinine Ratio: 18 (ref 9–23)
BUN: 14 mg/dL (ref 6–24)
CO2: 28 mmol/L (ref 20–29)
Calcium: 9.9 mg/dL (ref 8.7–10.2)
Chloride: 92 mmol/L — ABNORMAL LOW (ref 96–106)
Creatinine, Ser: 0.8 mg/dL (ref 0.57–1.00)
Glucose: 324 mg/dL — ABNORMAL HIGH (ref 70–99)
Potassium: 3.3 mmol/L — ABNORMAL LOW (ref 3.5–5.2)
Sodium: 137 mmol/L (ref 134–144)
eGFR: 86 mL/min/{1.73_m2} (ref >59)

## 2023-01-26 MED ORDER — KETOROLAC TROMETHAMINE 30 MG/ML IJ SOLN
30.0000 mg | Freq: Once | INTRAMUSCULAR | Status: AC
Start: 2023-01-26 — End: 2023-01-25
  Administered 2023-01-25: 30 mg via INTRAMUSCULAR

## 2023-01-26 MED ORDER — POTASSIUM CHLORIDE CRYS ER 20 MEQ PO TBCR: 20.0000 meq | EXTENDED_RELEASE_TABLET | Freq: Every day | ORAL | 0 refills | Status: DC

## 2023-01-26 NOTE — Addendum Note (Signed)
Addended byRaj Janus on: 01/26/2023 07:54 AM   Modules accepted: Orders

## 2023-01-31 ENCOUNTER — Ambulatory Visit
Admission: RE | Admit: 2023-01-31 | Discharge: 2023-01-31 | Disposition: A | Discharge status: Home / Self Care | Payer: 59 | Source: Ambulatory Visit | Attending: Nurse Practitioner | Admitting: Nurse Practitioner

## 2023-01-31 DIAGNOSIS — Z1231 Encounter for screening mammogram for malignant neoplasm of breast: Secondary | ICD-10-CM

## 2023-02-03 ENCOUNTER — Other Ambulatory Visit: Payer: Self-pay | Admitting: Nurse Practitioner

## 2023-02-03 DIAGNOSIS — R928 Other abnormal and inconclusive findings on diagnostic imaging of breast: Secondary | ICD-10-CM

## 2023-02-08 LAB — MAGNESIUM: Magnesium: 1.6 mg/dL (ref 1.6–2.3)

## 2023-02-08 LAB — SPECIMEN STATUS REPORT

## 2023-02-10 ENCOUNTER — Other Ambulatory Visit: Payer: Self-pay | Admitting: Pharmacist

## 2023-02-10 MED ORDER — LANCETS MISC. MISC: 3 refills | Status: AC

## 2023-02-10 MED ORDER — LANCET DEVICE MISC: 0 refills | Status: DC

## 2023-02-10 MED ORDER — BLOOD GLUCOSE TEST VI STRP: ORAL_STRIP | 3 refills | Status: AC

## 2023-02-10 MED ORDER — BLOOD GLUCOSE MONITORING SUPPL DEVI: 0 refills | Status: DC

## 2023-02-10 NOTE — Patient Instructions (Signed)
Nancy Pham,   Continue Trulicity 0.75 mg weekly for 4 weeks, then increase to 1.5 mg weekly. Continue glimepiride 4 mg daily for now. Please call me if you develop symptoms of low blood sugars - lightheadedness, dizziness, sweating.   Cut chlorthalidone in half to 12.5 mg daily. Monitor for swelling. Continue potassium daily, amlodipine 5 mg daily.   Check your blood sugars twice daily:  1) Fasting, first thing in the morning before breakfast and  2) 2 hours after your largest meal.   For a goal A1c of less than 7%, goal fasting readings are less than 130 and goal 2 hour after meal readings are less than 180.    Take care! Catie Eppie Gibson, PharmD, BCACP, CPP Clinical Pharmacist [facility] [phone]

## 2023-02-10 NOTE — Progress Notes (Unsigned)
02/10/2023 Name: Nancy Pham MRN: [id number] DOB: 06-23-1966  Chief Complaint  Patient presents with   Medication Management   Diabetes    Nancy Pham is a 57 y.o. year old female who presented for a telephone visit.   They were referred to the pharmacist by their PCP for assistance in managing diabetes.    Subjective:  Care Team: Primary Care Provider: Donell Beers, FNP ; Next Scheduled Visit: 02/25/23  Medication Access/Adherence  Current Pharmacy:  [id number] - [place], [place] - [address] [place] [address] Phone: [phone] Fax: [phone]   Patient reports affordability concerns with their medications: No  Patient reports access/transportation concerns to their pharmacy: No  Patient reports adherence concerns with their medications:  No     Diabetes:  Current medications: glimepiride 4 mg daily, Trulicity 0.75 mg  Medications tried in the past: metformin both IR and XR - nausea and vomiting; Victoza -did well, lost insurance and had to stop  Denies any stomach upset since starting Trulicity.   Current glucose readings: does not know where her glucometer is, is unsure if she has in-date strips  Hypertension:   Current medications: chlorthalidone 25 mg daily, amlodipine 5 mg QPM, atenolol 100 mg daily Hx angioedema with ACEi   History of hypokalemia since starting chlorthalidone. Reports swelling has been better controlled. Confirms she has been taking potassium 20 mEq daily.   Patient has a wrist upper arm home BP cuff, but has not been checking   Hyperlipidemia/ASCVD Risk Reduction   Current lipid lowering medications: rosuvastatin 40 mg daily   Antiplatelet regimen: aspirin 81 mg daily   Objective:  Lab Results  Component Value Date   HGBA1C 13.9 (A) 12/14/2022    Lab Results  Component Value Date   CREATININE 0.80 01/25/2023   BUN 14 01/25/2023   NA 137 01/25/2023   K 3.3 (L) 01/25/2023    CL 92 (L) 01/25/2023   CO2 28 01/25/2023    Lab Results  Component Value Date   CHOL 189 06/10/2021   HDL 49 06/10/2021   LDLCALC 98 06/10/2021   TRIG 247 (H) 06/10/2021   CHOLHDL 3.9 06/10/2021    Medications Reviewed Today     Reviewed by Alden Hipp, RPH-CPP (Pharmacist) on 02/10/23 at 1113  Med List Status: <None>   Medication Order Taking? Sig Documenting Provider Last Dose Status Informant  acetaminophen (TYLENOL) 325 MG tablet [id number]  Take 325-650 mg by mouth every 6 (six) hours as needed for mild pain, fever or headache.  [provider]  Active Self           Med Note Clearance Coots, Alanda Slim Dec 23, 2022 10:24 AM)    albuterol (VENTOLIN HFA) 108 (90 Base) MCG/ACT inhaler [id number]  Inhale 1-2 puffs into the lungs every 6 (six) hours as needed for wheezing or shortness of breath. Please dispense 3 months supple.  Patient not taking: Reported on 12/28/2022   [name], FNP  Active            Med Note Clearance Coots, Alanda Slim Dec 23, 2022 10:25 AM)    allopurinol (ZYLOPRIM) 300 MG tablet [id number] Yes Take 1 tablet (300 mg total) by mouth daily. Donell Beers, FNP Taking Active   amLODipine (NORVASC) 5 MG tablet [id number] Yes Take 1 tablet (5 mg total) by mouth daily. Donell Beers, FNP Taking Active   aspirin EC 81 MG tablet [id number] Yes  Take 1 tablet (81 mg total) by mouth daily. [name], FNP Taking Active   atenolol (TENORMIN) 100 MG tablet [id number] Yes Take 1 tablet (100 mg total) by mouth daily. Donell Beers, FNP Taking Active   cetirizine (ZYRTEC) 10 MG tablet [id number] Yes Take 10 mg by mouth daily. [provider] Taking Active   chlorthalidone (HYGROTON) 25 MG tablet [id number] Yes Take 1 tablet (25 mg total) by mouth daily. Donell Beers, FNP Taking Active   Cholecalciferol (VITAMIN D3) 125 MCG (5000 UT) CAPS [id number] Yes Take 1 capsule (5,000 Units total) by mouth daily. [name],  FNP Taking Active   cloNIDine (CATAPRES) tablet 0.2 mg [id number]   Barbette Merino, NP  Active   cyclobenzaprine (FLEXERIL) 10 MG tablet [id number]  Take 1 tablet (10 mg total) by mouth 3 (three) times daily as needed for muscle spasms. TAKE ONE TABLET BY MOUTH THREE TIMES DAILY AS NEEDED FOR MUSCLE SPASMS.  Patient not taking: Reported on 01/11/2023   Barbette Merino, NP  Active   Dulaglutide (TRULICITY) 0.75 MG/0.5ML Namon Cirri [id number] Yes Inject 0.75 mg into the skin once a week. Donell Beers, FNP Taking Active   famotidine (PEPCID) 20 MG tablet [id number] Yes Take 1 tablet (20 mg total) by mouth daily. Barbette Merino, NP Taking Active   ferrous sulfate 325 (65 FE) MG tablet [id number] Yes Take 1 tablet (325 mg total) by mouth daily with breakfast. [name], FNP Taking Active   fluticasone [facility]) 50 MCG/ACT nasal spray [id number] Yes Place 2 sprays into both nostrils daily as needed for allergies or rhinitis. [name], FNP Taking Active            Med Note Clearance Coots, Alanda Slim Dec 23, 2022 10:24 AM)    gabapentin (NEURONTIN) 100 MG capsule [id number] Yes Take 1 capsule (100 mg total) by mouth 3 (three) times daily. Donell Beers, FNP Taking Active   glimepiride (AMARYL) 4 MG tablet [id number] Yes Take 1 tablet (4 mg total) by mouth daily before breakfast. Donell Beers, FNP Taking Active   hydrocortisone cream (HYDROCORTISONE ANTI-ITCH) 1 % [id number]  Apply 1 application topically 2 (two) times daily.  Patient not taking: Reported on 12/14/2022   [name], FNP  Active   Inulin (FIBER CHOICE PO) [id number]  Take 1 capsule by mouth daily. [provider]  Active     Discontinued 08/15/19 1518          Med Note Antony Madura, Dyane Dustman Aug 21, 2017  4:16 PM) Regimen verified by the patient  meloxicam (MOBIC) 15 MG tablet [id number] Yes Take 1 tablet (15 mg total) by mouth daily as needed for pain. Donell Beers, FNP Taking Active   Multiple  Vitamins-Minerals (CENTRUM SILVER 50+WOMEN) TABS [id number] Yes Take 1 tablet by mouth daily. Barbette Merino, NP Taking Active   Omega-3 Fatty Acids (FISH OIL PO) [id number] Yes Take 1 capsule by mouth daily. [provider] Taking Active   potassium chloride SA (KLOR-CON M) 20 MEQ tablet [id number] Yes Take 1 tablet (20 mEq total) by mouth daily. Donell Beers, FNP Taking Active   Probiotic Product (PROBIOTIC DAILY PO) [id number] Yes Take 1 capsule by mouth daily. [provider] Taking Active Self  rosuvastatin (CRESTOR) 40 MG tablet [id number] Yes Take 1 tablet (40 mg total) by mouth daily. Donell Beers, FNP Taking Active  Assessment/Plan:   Diabetes: - Currently uncontrolled but likely  - Reviewed long term cardiovascular and renal outcomes of uncontrolled blood sugar - Reviewed goal A1c, goal fasting, and goal 2 hour post prandial glucose - Recommend to continue Trulicity 0.75 mg weekly for total of 4 weeks, then increase to 1.5 mg weekly. Continue glimepiride 4 mg daily for now, though monitoring for hypoglycemic symptoms or readings.  - Recommend to check glucose twice daily, fasting and 2 hour post prandial. Order sent for glucometer through system-wide standing order.   Hypertension: - Currently controlled but with hypokalemia - Reviewed long term cardiovascular and renal outcomes of uncontrolled blood pressure - Reviewed appropriate blood pressure monitoring technique and reviewed goal blood pressure. Recommended to check home blood pressure and heart rate periodically - Recommend to decrease chlorthalidone to 12.5 mg daily. Monitor swelling, blood pressure. Follow up with PCP in 2 weeks as scheduled and check BMP.    Hyperlipidemia/ASCVD Risk Reduction: - Currently uncontrolled when last checked, but due for updated lipid panel - Recommend to continue current regimen at this time   Follow Up Plan: PCP in 2 weeks, PharmD 4 weeks  after that  Catie Eppie Gibson, PharmD, BCACP, CPP Clinical Pharmacist [facility] [phone]

## 2023-02-11 ENCOUNTER — Other Ambulatory Visit: Payer: Self-pay | Admitting: Nurse Practitioner

## 2023-02-11 MED ORDER — TRULICITY 1.5 MG/0.5ML ~~LOC~~ SOAJ: 1.5000 mg | SUBCUTANEOUS | 1 refills | Status: DC

## 2023-02-14 ENCOUNTER — Other Ambulatory Visit: Payer: Self-pay | Admitting: Nurse Practitioner

## 2023-02-14 ENCOUNTER — Ambulatory Visit
Admission: RE | Admit: 2023-02-14 | Discharge: 2023-02-14 | Disposition: A | Discharge status: Home / Self Care | Payer: 59 | Source: Ambulatory Visit | Attending: Nurse Practitioner | Admitting: Nurse Practitioner

## 2023-02-14 DIAGNOSIS — R921 Mammographic calcification found on diagnostic imaging of breast: Secondary | ICD-10-CM

## 2023-02-14 DIAGNOSIS — R928 Other abnormal and inconclusive findings on diagnostic imaging of breast: Secondary | ICD-10-CM

## 2023-02-20 ENCOUNTER — Other Ambulatory Visit: Payer: Self-pay | Admitting: Nurse Practitioner

## 2023-02-20 DIAGNOSIS — E1165 Type 2 diabetes mellitus with hyperglycemia: Secondary | ICD-10-CM

## 2023-02-21 ENCOUNTER — Other Ambulatory Visit: Payer: Self-pay | Admitting: Nurse Practitioner

## 2023-02-21 DIAGNOSIS — G8929 Other chronic pain: Secondary | ICD-10-CM

## 2023-02-23 ENCOUNTER — Ambulatory Visit
Admission: RE | Admit: 2023-02-23 | Discharge: 2023-02-23 | Disposition: A | Discharge status: Home / Self Care | Payer: 59 | Source: Ambulatory Visit | Attending: Nurse Practitioner | Admitting: Nurse Practitioner

## 2023-02-23 DIAGNOSIS — R921 Mammographic calcification found on diagnostic imaging of breast: Secondary | ICD-10-CM

## 2023-02-23 HISTORY — PX: BREAST BIOPSY: SHX20

## 2023-02-25 ENCOUNTER — Ambulatory Visit (INDEPENDENT_AMBULATORY_CARE_PROVIDER_SITE_OTHER): Payer: 59 | Admitting: Nurse Practitioner

## 2023-02-25 ENCOUNTER — Other Ambulatory Visit: Payer: Self-pay | Admitting: Nurse Practitioner

## 2023-02-25 VITALS — BP 136/78 | HR 68 | Temp 97.3°F | Wt 295.2 lb

## 2023-02-25 DIAGNOSIS — I10 Essential (primary) hypertension: Secondary | ICD-10-CM

## 2023-02-25 DIAGNOSIS — E876 Hypokalemia: Secondary | ICD-10-CM

## 2023-02-25 DIAGNOSIS — E1165 Type 2 diabetes mellitus with hyperglycemia: Secondary | ICD-10-CM

## 2023-02-25 DIAGNOSIS — E785 Hyperlipidemia, unspecified: Secondary | ICD-10-CM | POA: Diagnosis not present

## 2023-02-25 MED ORDER — SPIRONOLACTONE 25 MG PO TABS
12.5000 mg | ORAL_TABLET | Freq: Every day | ORAL | 0 refills | Status: DC
Start: 2023-02-25 — End: 2023-03-25

## 2023-02-25 MED ORDER — TRULICITY 1.5 MG/0.5ML ~~LOC~~ SOAJ: 1.5000 mg | SUBCUTANEOUS | 1 refills | Status: DC

## 2023-02-25 MED ORDER — SPIRONOLACTONE 25 MG PO TABS
12.5000 mg | ORAL_TABLET | Freq: Every day | ORAL | 1 refills | Status: DC
Start: 2023-02-25 — End: 2023-02-25

## 2023-02-25 NOTE — Assessment & Plan Note (Signed)
Discontinue potassium and Hygroton Start spironolactone 12.5 mg daily Checking BMP

## 2023-02-25 NOTE — Patient Instructions (Addendum)
Please monitor your blood pressure at home Blood pressure goal is less than 130/80 Please stop taking Hygroton and potassium I am starting you on a new medication called spironolactone take 12.5 mg daily Labs in 2 weeks Follow-up in the office in 4 weeks   Goal for fasting blood sugar ranges from 80 to 120 and 2 hours after any meal or at bedtime should be between 130 to 170.     1. Uncontrolled type 2 diabetes mellitus with hyperglycemia (HCC)  - Dulaglutide (TRULICITY) 1.5 MG/0.5ML SOPN; Inject 1.5 mg into the skin once a week.  Dispense: 2 mL; Refill: 1 - Basic Metabolic Panel - Lipid panel - spironolactone (ALDACTONE) 25 MG tablet; Take 0.5 tablets (12.5 mg total) by mouth daily.  Dispense: 90 tablet; Refill: 0  2. Primary hypertension  - Basic Metabolic Panel - spironolactone (ALDACTONE) 25 MG tablet; Take 0.5 tablets (12.5 mg total) by mouth daily.  Dispense: 90 tablet; Refill: 0     It is important that you exercise regularly at least 30 minutes 5 times a week as tolerated  Think about what you will eat, plan ahead. Choose " clean, green, fresh or frozen" over canned, processed or packaged foods which are more sugary, salty and fatty. 70 to 75% of food eaten should be vegetables and fruit. Three meals at set times with snacks allowed between meals, but they must be fruit or vegetables. Aim to eat over a 12 hour period , example 7 am to 7 pm, and STOP after  your last meal of the day. Drink water,generally about 64 ounces per day, no other drink is as healthy. Fruit juice is best enjoyed in a healthy way, by EATING the fruit.  Thanks for choosing [facility] we consider it a privelige to serve you.

## 2023-02-25 NOTE — Progress Notes (Signed)
9

## 2023-02-25 NOTE — Assessment & Plan Note (Signed)
BP Readings from Last 3 Encounters:  02/25/23 136/78  01/25/23 128/68  01/11/23 (!) 156/76  Blood pressure is slightly elevated in the office today Currently on Hygroton 25 mg daily, amlodipine 10 mg daily, atenolol 100 mg daily Due to persistent hypokalemia will discontinue Hygroton 25 mg daily and start spironolactone 12.5 mg daily, continue amlodipine 10 mg daily, atenolol 100 mg daily DASH diet advised.  Engage in regular moderate exercises as tolerated at least 150 minutes weekly Checking BMP today, BMP in 2 weeks, follow-up in the office in 4 weeks. Plan to start patient on spironolactone was discussed with the clinical pharmacist Catie.

## 2023-02-25 NOTE — Progress Notes (Signed)
New Patient Office Visit  Subjective:  Patient ID: Nancy Pham, female    DOB: [birth year]  Age: 57 y.o. MRN: [id number]  CC:  Chief Complaint  Patient presents with   Diabetes    Also b/p per p she has taken her medication    HPI Nancy Pham is a 57 y.o. female  has a past medical history of Asthma, Cancer (HCC), Chronic pain of right knee, Diabetes mellitus, Gout, History of cervical cancer, History of ovarian cancer, Hyperlipidemia, Hyperlipidemia LDL goal <70, Hypertension, and Obesity.  Patient presents for follow-up for her chronic medical conditions  Hypertension.  Currently on amlodipine 5 mg daily, atenolol 100 mg daily, Hygroton 25 mg daily.  She denies chest pain, dizziness, has bilateral lower extremity edema.    Uncontrolled type 2 diabetes.  Currently on glimepiride 4 mg daily, Trulicity 0.75mg  once weekly injection.  She does not check her blood sugar at home.  She denies any adverse reactions to Trulicity.  Takes Crestor 40 mg daily for hyperlipidemia.    Past Medical History:  Diagnosis Date   Asthma    Cancer (HCC)    Chronic pain of right knee    Diabetes mellitus    Gout    History of cervical cancer    History of ovarian cancer    Hyperlipidemia    Hyperlipidemia LDL goal <70    Hypertension    Obesity     Past Surgical History:  Procedure Laterality Date   ABDOMINAL HYSTERECTOMY     BREAST BIOPSY Left 02/23/2023   MM LT BREAST BX W LOC DEV 1ST LESION IMAGE BX SPEC STEREO GUIDE 02/23/2023 [facility]   BREAST BIOPSY Right 02/23/2023   MM RT BREAST BX W LOC DEV 1ST LESION IMAGE BX SPEC STEREO GUIDE 02/23/2023 [facility]    Family History  Problem Relation Age of Onset   Seizures Mother    Heart attack Father    Heart attack Sister    Hypertension Sister    Hypertension Brother    Hypertension Brother    Hypertension Brother    Hypertension Brother    Hypertension Sister     Social History   Socioeconomic History    Marital status: Single    Spouse name: Not on file   Number of children: Not on file   Years of education: Not on file   Highest education level: Not on file  Occupational History   Not on file  Tobacco Use   Smoking status: Never   Smokeless tobacco: Never  Vaping Use   Vaping status: Never Used  Substance and Sexual Activity   Alcohol use: No   Drug use: No   Sexual activity: Not Currently  Other Topics Concern   Not on file  Social History Narrative   Not on file   Social Determinants of Health   Financial Resource Strain: Not on file  Food Insecurity: Not on file  Transportation Needs: Not on file  Physical Activity: Not on file  Stress: Not on file  Social Connections: Not on file  Intimate Partner Violence: Not on file    ROS Review of Systems  Constitutional:  Negative for activity change, appetite change, chills, fatigue and fever.  HENT:  Negative for congestion, dental problem, ear discharge, ear pain, hearing loss, rhinorrhea, sinus pressure, sinus pain, sneezing and sore throat.   Eyes:  Negative for pain, discharge, redness and itching.  Respiratory:  Negative for cough, chest tightness, shortness of breath and  wheezing.   Cardiovascular:  Negative for chest pain, palpitations and leg swelling.  Gastrointestinal:  Negative for abdominal distention, abdominal pain, anal bleeding, blood in stool, constipation, diarrhea, nausea, rectal pain and vomiting.  Endocrine: Negative for cold intolerance, heat intolerance, polydipsia, polyphagia and polyuria.  Genitourinary:  Negative for difficulty urinating, dysuria, flank pain, frequency, hematuria, menstrual problem, pelvic pain and vaginal bleeding.  Musculoskeletal:  Negative for back pain, gait problem, joint swelling and myalgias.  Skin:  Negative for color change, pallor, rash and wound.  Allergic/Immunologic: Negative for environmental allergies, food allergies and immunocompromised state.  Neurological:   Negative for dizziness, tremors, facial asymmetry, weakness and headaches.  Hematological:  Negative for adenopathy. Does not bruise/bleed easily.  Psychiatric/Behavioral:  Negative for agitation, behavioral problems, confusion, decreased concentration, hallucinations, self-injury and suicidal ideas.     Objective:   Today's Vitals: BP 136/78   Pulse 68   Temp (!) 97.3 F (36.3 C)   Wt 295 lb 3.2 oz (133.9 kg)   SpO2 98%   BMI 47.65 kg/m   Physical Exam Vitals and nursing note reviewed.  Constitutional:      General: She is not in acute distress.    Appearance: Normal appearance. She is obese. She is not ill-appearing, toxic-appearing or diaphoretic.  HENT:     Mouth/Throat:     Mouth: Mucous membranes are moist.     Pharynx: Oropharynx is clear. No oropharyngeal exudate or posterior oropharyngeal erythema.  Eyes:     General: No scleral icterus.       Right eye: No discharge.        Left eye: No discharge.     Extraocular Movements: Extraocular movements intact.     Conjunctiva/sclera: Conjunctivae normal.  Cardiovascular:     Rate and Rhythm: Normal rate and regular rhythm.     Pulses: Normal pulses.     Heart sounds: Normal heart sounds. No murmur heard.    No friction rub. No gallop.  Pulmonary:     Effort: Pulmonary effort is normal. No respiratory distress.     Breath sounds: Normal breath sounds. No stridor. No wheezing, rhonchi or rales.  Chest:     Chest wall: No tenderness.  Abdominal:     General: There is no distension.     Palpations: Abdomen is soft.     Tenderness: There is no abdominal tenderness. There is no right CVA tenderness, left CVA tenderness or guarding.  Musculoskeletal:        General: No swelling, tenderness, deformity or signs of injury.     Right lower leg: Edema present.     Left lower leg: Edema present.     Comments: Nonpitting edema bilateral  Skin:    General: Skin is warm and dry.     Capillary Refill: Capillary refill takes less  than 2 seconds.     Coloration: Skin is not jaundiced or pale.     Findings: No bruising, erythema or lesion.  Neurological:     Mental Status: She is alert and oriented to person, place, and time.     Motor: No weakness.     Coordination: Coordination normal.     Gait: Gait normal.  Psychiatric:        Mood and Affect: Mood normal.        Behavior: Behavior normal.        Thought Content: Thought content normal.        Judgment: Judgment normal.     Assessment & Plan:  Problem List Items Addressed This Visit       Cardiovascular and Mediastinum   Hypertension    BP Readings from Last 3 Encounters:  02/25/23 136/78  01/25/23 128/68  01/11/23 (!) 156/76  Blood pressure is slightly elevated in the office today Currently on Hygroton 25 mg daily, amlodipine 10 mg daily, atenolol 100 mg daily Due to persistent hypokalemia will discontinue Hygroton 25 mg daily and start spironolactone 12.5 mg daily, continue amlodipine 10 mg daily, atenolol 100 mg daily DASH diet advised.  Engage in regular moderate exercises as tolerated at least 150 minutes weekly Checking BMP today, BMP in 2 weeks, follow-up in the office in 4 weeks. Plan to start patient on spironolactone was discussed with the clinical pharmacist Catie.       Relevant Medications   spironolactone (ALDACTONE) 25 MG tablet   Other Relevant Orders   Basic Metabolic Panel   Basic Metabolic Panel     Endocrine   Uncontrolled type 2 diabetes mellitus with hyperglycemia (HCC) - Primary    Lab Results  Component Value Date   HGBA1C 13.9 (A) 12/14/2022  Continue glimepiride 4 mg daily, start Trulicity 1.5 mg once weekly injection Patient counseled on low-carb modified diet CBG goals discussed she was encouraged to monitor her blood sugar Diabetic foot exam completed, no sensation on examination with monofilament ,will refer him to the podiatrist Follow-up in 4 weeks Appreciate collaboration with the clinical  pharmacist Checking lipid panel today      Relevant Medications   Dulaglutide (TRULICITY) 1.5 MG/0.5ML SOPN   spironolactone (ALDACTONE) 25 MG tablet   Other Relevant Orders   Basic Metabolic Panel   Lipid panel   Ambulatory referral to Podiatry     Other   Dyslipidemia, goal LDL below 70    Checking lipid panel On Crestor 40 mg daily      Relevant Medications   spironolactone (ALDACTONE) 25 MG tablet   Hypokalemia    Discontinue potassium and Hygroton Start spironolactone 12.5 mg daily Checking BMP       Outpatient Encounter Medications as of 02/25/2023  Medication Sig   acetaminophen (TYLENOL) 325 MG tablet Take 325-650 mg by mouth every 6 (six) hours as needed for mild pain, fever or headache.    albuterol (VENTOLIN HFA) 108 (90 Base) MCG/ACT inhaler Inhale 1-2 puffs into the lungs every 6 (six) hours as needed for wheezing or shortness of breath. Please dispense 3 months supple.   allopurinol (ZYLOPRIM) 300 MG tablet Take 1 tablet (300 mg total) by mouth daily.   amLODipine (NORVASC) 5 MG tablet Take 1 tablet (5 mg total) by mouth daily.   aspirin EC 81 MG tablet Take 1 tablet (81 mg total) by mouth daily.   atenolol (TENORMIN) 100 MG tablet Take 1 tablet (100 mg total) by mouth daily.   Blood Glucose Monitoring Suppl DEVI Use to check up to twice a day. May substitute to any manufacturer covered by patient's insurance.   cetirizine (ZYRTEC) 10 MG tablet Take 10 mg by mouth daily.   Cholecalciferol (VITAMIN D3) 125 MCG (5000 UT) CAPS Take 1 capsule (5,000 Units total) by mouth daily.   famotidine (PEPCID) 20 MG tablet Take 1 tablet (20 mg total) by mouth daily.   ferrous sulfate 325 (65 FE) MG tablet Take 1 tablet (325 mg total) by mouth daily with breakfast.   fluticasone (FLONASE) 50 MCG/ACT nasal spray Place 2 sprays into both nostrils daily as needed for allergies or rhinitis.   gabapentin (  NEURONTIN) 100 MG capsule Take 1 capsule (100 mg total) by mouth 3 (three)  times daily.   glimepiride (AMARYL) 4 MG tablet Take 1 tablet (4 mg total) by mouth daily before breakfast.   Inulin (FIBER CHOICE PO) Take 1 capsule by mouth daily.   meloxicam (MOBIC) 15 MG tablet TAKE 1 TABLET BY MOUTH EVERY DAY AS NEEDED FOR PAIN   Multiple Vitamins-Minerals (CENTRUM SILVER 50+WOMEN) TABS Take 1 tablet by mouth daily.   Omega-3 Fatty Acids (FISH OIL PO) Take 1 capsule by mouth daily.   Probiotic Product (PROBIOTIC DAILY PO) Take 1 capsule by mouth daily.   rosuvastatin (CRESTOR) 40 MG tablet Take 1 tablet (40 mg total) by mouth daily.   spironolactone (ALDACTONE) 25 MG tablet Take 0.5 tablets (12.5 mg total) by mouth daily.   [DISCONTINUED] chlorthalidone (HYGROTON) 25 MG tablet Take 1 tablet (25 mg total) by mouth daily.   [DISCONTINUED] Dulaglutide (TRULICITY) 0.75 MG/0.5ML SOPN INJECT 0.75 MG SUBCUTANEOUSLY ONE TIME PER WEEK   [DISCONTINUED] potassium chloride SA (KLOR-CON M) 20 MEQ tablet Take 1 tablet (20 mEq total) by mouth daily.   cyclobenzaprine (FLEXERIL) 10 MG tablet Take 1 tablet (10 mg total) by mouth 3 (three) times daily as needed for muscle spasms. TAKE ONE TABLET BY MOUTH THREE TIMES DAILY AS NEEDED FOR MUSCLE SPASMS. (Patient not taking: Reported on 01/11/2023)   Dulaglutide (TRULICITY) 1.5 MG/0.5ML SOPN Inject 1.5 mg into the skin once a week.   Glucose Blood (BLOOD GLUCOSE TEST STRIPS) STRP Use to check up to twice a day. May substitute to any manufacturer covered by patient's insurance. (Patient not taking: Reported on 02/25/2023)   hydrocortisone cream (HYDROCORTISONE ANTI-ITCH) 1 % Apply 1 application topically 2 (two) times daily. (Patient not taking: Reported on 12/14/2022)   Lancet Device MISC Use to check up to twice a day. May substitute to any manufacturer covered by patient's insurance. (Patient not taking: Reported on 02/25/2023)   Lancets Misc. MISC Use to check up to twice a day. May substitute to any manufacturer covered by patient's insurance.  (Patient not taking: Reported on 02/25/2023)   [DISCONTINUED] Dulaglutide (TRULICITY) 1.5 MG/0.5ML SOPN Inject 1.5 mg into the skin once a week. (Patient not taking: Reported on 02/25/2023)   [DISCONTINUED] Liraglutide (VICTOZA) 18 MG/3ML SOLN injection Inject 1.8 mLs (10.8 mg total) into the skin daily.   Facility-Administered Encounter Medications as of 02/25/2023  Medication   cloNIDine (CATAPRES) tablet 0.2 mg    Follow-up: Return in about 4 weeks (around 03/25/2023) for Labs in 2 weeks.   Donell Beers, FNP

## 2023-02-25 NOTE — Assessment & Plan Note (Signed)
Lab Results  Component Value Date   HGBA1C 13.9 (A) 12/14/2022  Continue glimepiride 4 mg daily, start Trulicity 1.5 mg once weekly injection Patient counseled on low-carb modified diet CBG goals discussed she was encouraged to monitor her blood sugar Diabetic foot exam completed, no sensation on examination with monofilament ,will refer him to the podiatrist Follow-up in 4 weeks Appreciate collaboration with the clinical pharmacist Checking lipid panel today

## 2023-02-25 NOTE — Telephone Encounter (Signed)
Please advise Due to D/ C note

## 2023-02-25 NOTE — Assessment & Plan Note (Signed)
Checking lipid panel On Crestor 40 mg daily

## 2023-02-28 ENCOUNTER — Other Ambulatory Visit: Payer: Self-pay | Admitting: Nurse Practitioner

## 2023-02-28 DIAGNOSIS — E785 Hyperlipidemia, unspecified: Secondary | ICD-10-CM

## 2023-02-28 MED ORDER — EZETIMIBE 10 MG PO TABS
10.0000 mg | ORAL_TABLET | Freq: Every day | ORAL | 3 refills | Status: AC
Start: 2023-02-28 — End: ?

## 2023-03-11 ENCOUNTER — Other Ambulatory Visit: Payer: 59

## 2023-03-11 DIAGNOSIS — M1A09X0 Idiopathic chronic gout, multiple sites, without tophus (tophi): Secondary | ICD-10-CM

## 2023-03-11 DIAGNOSIS — I10 Essential (primary) hypertension: Secondary | ICD-10-CM

## 2023-03-12 LAB — URIC ACID: Uric Acid: 2.9 mg/dL — ABNORMAL LOW (ref 3.0–7.2)

## 2023-03-12 LAB — BASIC METABOLIC PANEL
BUN/Creatinine Ratio: 18 (ref 9–23)
BUN: 12 mg/dL (ref 6–24)
CO2: 24 mmol/L (ref 20–29)
Calcium: 9.4 mg/dL (ref 8.7–10.2)
Chloride: 102 mmol/L (ref 96–106)
Creatinine, Ser: 0.65 mg/dL (ref 0.57–1.00)
Glucose: 128 mg/dL — ABNORMAL HIGH (ref 70–99)
Potassium: 4 mmol/L (ref 3.5–5.2)
Sodium: 139 mmol/L (ref 134–144)
eGFR: 102 mL/min/{1.73_m2} (ref >59)

## 2023-03-15 ENCOUNTER — Other Ambulatory Visit: Payer: 59 | Admitting: Pharmacist

## 2023-03-25 ENCOUNTER — Encounter: Payer: Self-pay | Admitting: Nurse Practitioner

## 2023-03-25 ENCOUNTER — Ambulatory Visit (INDEPENDENT_AMBULATORY_CARE_PROVIDER_SITE_OTHER): Payer: 59 | Admitting: Nurse Practitioner

## 2023-03-25 ENCOUNTER — Other Ambulatory Visit: Payer: Self-pay | Admitting: Nurse Practitioner

## 2023-03-25 ENCOUNTER — Ambulatory Visit: Payer: Self-pay | Admitting: Nurse Practitioner

## 2023-03-25 VITALS — BP 123/82 | HR 71 | Temp 97.2°F | Wt 297.0 lb

## 2023-03-25 DIAGNOSIS — G8929 Other chronic pain: Secondary | ICD-10-CM

## 2023-03-25 DIAGNOSIS — M25561 Pain in right knee: Secondary | ICD-10-CM

## 2023-03-25 DIAGNOSIS — I10 Essential (primary) hypertension: Secondary | ICD-10-CM | POA: Diagnosis not present

## 2023-03-25 DIAGNOSIS — E785 Hyperlipidemia, unspecified: Secondary | ICD-10-CM

## 2023-03-25 DIAGNOSIS — E1165 Type 2 diabetes mellitus with hyperglycemia: Secondary | ICD-10-CM | POA: Diagnosis not present

## 2023-03-25 LAB — POCT GLYCOSYLATED HEMOGLOBIN (HGB A1C): Hemoglobin A1C: 8.9 % — AB (ref 4.0–5.6)

## 2023-03-25 MED ORDER — GLIMEPIRIDE 4 MG PO TABS: 4.0000 mg | ORAL_TABLET | Freq: Every day | ORAL | 1 refills | Status: DC

## 2023-03-25 MED ORDER — AMLODIPINE BESYLATE 5 MG PO TABS
5.0000 mg | ORAL_TABLET | Freq: Every day | ORAL | 1 refills | Status: DC
Start: 2023-03-25 — End: 2023-06-09

## 2023-03-25 MED ORDER — ATENOLOL 100 MG PO TABS: 100.0000 mg | ORAL_TABLET | Freq: Every day | ORAL | 1 refills | Status: DC

## 2023-03-25 MED ORDER — SPIRONOLACTONE 25 MG PO TABS: 12.5000 mg | ORAL_TABLET | Freq: Every day | ORAL | 1 refills | Status: DC

## 2023-03-25 NOTE — Progress Notes (Signed)
Established Patient Office Visit  Subjective:  Patient ID: Nancy Pham, female    DOB: 10/04/1965  Age: 57 y.o. MRN: [id number]  CC:  Chief Complaint  Patient presents with   Hypertension    HPI Willoh Foskett is a 57 y.o. female  has a past medical history of Asthma, Cancer (HCC), Chronic pain of right knee, Diabetes mellitus, Gout, History of cervical cancer, History of ovarian cancer, Hyperlipidemia, Hyperlipidemia LDL goal <70, Hypertension, and Obesity.    Patient presents for follow-up for hypertension. She denies any adverse reactions to current medications. She continues to have chronic bilateral knee pain, not interested in orthopedic referral.  Has been taking Gabapentin 100 mg 3 times daily and meloxicam as needed.     Past Medical History:  Diagnosis Date   Asthma    Cancer (HCC)    Chronic pain of right knee    Diabetes mellitus    Gout    History of cervical cancer    History of ovarian cancer    Hyperlipidemia    Hyperlipidemia LDL goal <70    Hypertension    Obesity     Past Surgical History:  Procedure Laterality Date   ABDOMINAL HYSTERECTOMY     BREAST BIOPSY Left 02/23/2023   MM LT BREAST BX W LOC DEV 1ST LESION IMAGE BX SPEC STEREO GUIDE 02/23/2023 [facility]   BREAST BIOPSY Right 02/23/2023   MM RT BREAST BX W LOC DEV 1ST LESION IMAGE BX SPEC STEREO GUIDE 02/23/2023 [facility]    Family History  Problem Relation Age of Onset   Seizures Mother    Heart attack Father    Heart attack Sister    Hypertension Sister    Hypertension Brother    Hypertension Brother    Hypertension Brother    Hypertension Brother    Hypertension Sister     Social History   Socioeconomic History   Marital status: Single    Spouse name: Not on file   Number of children: Not on file   Years of education: Not on file   Highest education level: Not on file  Occupational History   Not on file  Tobacco Use   Smoking status: Never   Smokeless  tobacco: Never  Vaping Use   Vaping status: Never Used  Substance and Sexual Activity   Alcohol use: No   Drug use: No   Sexual activity: Not Currently  Other Topics Concern   Not on file  Social History Narrative   Not on file   Social Determinants of Health   Financial Resource Strain: Not on file  Food Insecurity: Not on file  Transportation Needs: Not on file  Physical Activity: Not on file  Stress: Not on file  Social Connections: Not on file  Intimate Partner Violence: Not on file    Outpatient Medications Prior to Visit  Medication Sig Dispense Refill   acetaminophen (TYLENOL) 325 MG tablet Take 325-650 mg by mouth every 6 (six) hours as needed for mild pain, fever or headache.      albuterol (VENTOLIN HFA) 108 (90 Base) MCG/ACT inhaler Inhale 1-2 puffs into the lungs every 6 (six) hours as needed for wheezing or shortness of breath. Please dispense 3 months supple. 8 g 11   allopurinol (ZYLOPRIM) 300 MG tablet Take 1 tablet (300 mg total) by mouth daily. 90 tablet 3   aspirin EC 81 MG tablet Take 1 tablet (81 mg total) by mouth daily. 90 tablet 1  Blood Glucose Monitoring Suppl DEVI Use to check up to twice a day. May substitute to any manufacturer covered by patient's insurance. 1 each 0   cetirizine (ZYRTEC) 10 MG tablet Take 10 mg by mouth daily.     Cholecalciferol (VITAMIN D3) 125 MCG (5000 UT) CAPS Take 1 capsule (5,000 Units total) by mouth daily. 15 capsule 1   Dulaglutide (TRULICITY) 1.5 MG/0.5ML SOPN Inject 1.5 mg into the skin once a week. 2 mL 1   ezetimibe (ZETIA) 10 MG tablet Take 1 tablet (10 mg total) by mouth daily. 90 tablet 3   famotidine (PEPCID) 20 MG tablet Take 1 tablet (20 mg total) by mouth daily. 90 tablet 3   ferrous sulfate 325 (65 FE) MG tablet Take 1 tablet (325 mg total) by mouth daily with breakfast. 90 tablet 3   fluticasone (FLONASE) 50 MCG/ACT nasal spray Place 2 sprays into both nostrils daily as needed for allergies or rhinitis. 11.1  mL 11   gabapentin (NEURONTIN) 100 MG capsule Take 1 capsule (100 mg total) by mouth 3 (three) times daily. 270 capsule 3   Inulin (FIBER CHOICE PO) Take 1 capsule by mouth daily.     Multiple Vitamins-Minerals (CENTRUM SILVER 50+WOMEN) TABS Take 1 tablet by mouth daily. 90 tablet 1   Omega-3 Fatty Acids (FISH OIL PO) Take 1 capsule by mouth daily.     Probiotic Product (PROBIOTIC DAILY PO) Take 1 capsule by mouth daily.     rosuvastatin (CRESTOR) 40 MG tablet Take 1 tablet (40 mg total) by mouth daily. 90 tablet 1   amLODipine (NORVASC) 5 MG tablet Take 1 tablet (5 mg total) by mouth daily. 90 tablet 1   atenolol (TENORMIN) 100 MG tablet Take 1 tablet (100 mg total) by mouth daily. 90 tablet 0   glimepiride (AMARYL) 4 MG tablet Take 1 tablet (4 mg total) by mouth daily before breakfast. 60 tablet 0   meloxicam (MOBIC) 15 MG tablet TAKE 1 TABLET BY MOUTH EVERY DAY AS NEEDED FOR PAIN 30 tablet 0   spironolactone (ALDACTONE) 25 MG tablet Take 0.5 tablets (12.5 mg total) by mouth daily. 90 tablet 0   cyclobenzaprine (FLEXERIL) 10 MG tablet Take 1 tablet (10 mg total) by mouth 3 (three) times daily as needed for muscle spasms. TAKE ONE TABLET BY MOUTH THREE TIMES DAILY AS NEEDED FOR MUSCLE SPASMS. (Patient not taking: Reported on 01/11/2023) 90 tablet 2   Glucose Blood (BLOOD GLUCOSE TEST STRIPS) STRP Use to check up to twice a day. May substitute to any manufacturer covered by patient's insurance. (Patient not taking: Reported on 02/25/2023) 100 strip 3   hydrocortisone cream (HYDROCORTISONE ANTI-ITCH) 1 % Apply 1 application topically 2 (two) times daily. (Patient not taking: Reported on 12/14/2022) 30 g 3   Lancet Device MISC Use to check up to twice a day. May substitute to any manufacturer covered by patient's insurance. (Patient not taking: Reported on 03/25/2023) 1 each 0   Lancets Misc. MISC Use to check up to twice a day. May substitute to any manufacturer covered by patient's insurance. (Patient not  taking: Reported on 02/25/2023) 100 each 3   Facility-Administered Medications Prior to Visit  Medication Dose Route Frequency Provider Last Rate Last Admin   cloNIDine (CATAPRES) tablet 0.2 mg  0.2 mg Oral Once Barbette Merino, NP        Allergies  Allergen Reactions   Ace Inhibitors Swelling    Swelling of the lips   Tape Rash  No Band-Aids!!    ROS Review of Systems  Constitutional:  Negative for activity change, appetite change, chills, fatigue and fever.  HENT:  Negative for congestion, dental problem, ear discharge, ear pain, hearing loss, rhinorrhea, sinus pressure, sinus pain, sneezing and sore throat.   Eyes:  Negative for pain, discharge, redness and itching.  Respiratory:  Negative for cough, chest tightness, shortness of breath and wheezing.   Cardiovascular:  Negative for chest pain, palpitations and leg swelling.  Gastrointestinal:  Negative for abdominal distention, abdominal pain, anal bleeding, blood in stool, constipation, diarrhea, nausea, rectal pain and vomiting.  Endocrine: Negative for polydipsia, polyphagia and polyuria.  Genitourinary:  Negative for difficulty urinating, dysuria, flank pain, frequency, hematuria, menstrual problem, pelvic pain and vaginal bleeding.  Musculoskeletal:  Positive for arthralgias. Negative for back pain, gait problem, joint swelling and myalgias.  Skin:  Negative for color change, pallor, rash and wound.  Neurological:  Negative for dizziness, tremors, facial asymmetry, weakness and headaches.  Hematological:  Negative for adenopathy. Does not bruise/bleed easily.  Psychiatric/Behavioral:  Negative for agitation, behavioral problems, confusion, decreased concentration, hallucinations, self-injury and suicidal ideas.       Objective:    Physical Exam Vitals and nursing note reviewed.  Constitutional:      General: She is not in acute distress.    Appearance: Normal appearance. She is obese. She is not ill-appearing,  toxic-appearing or diaphoretic.  HENT:     Mouth/Throat:     Mouth: Mucous membranes are moist.     Pharynx: Oropharynx is clear. No oropharyngeal exudate or posterior oropharyngeal erythema.  Eyes:     General: No scleral icterus.       Right eye: No discharge.        Left eye: No discharge.     Extraocular Movements: Extraocular movements intact.     Conjunctiva/sclera: Conjunctivae normal.  Cardiovascular:     Rate and Rhythm: Normal rate and regular rhythm.     Pulses: Normal pulses.     Heart sounds: Normal heart sounds. No murmur heard.    No friction rub. No gallop.  Pulmonary:     Effort: Pulmonary effort is normal. No respiratory distress.     Breath sounds: Normal breath sounds. No stridor. No wheezing, rhonchi or rales.  Chest:     Chest wall: No tenderness.  Abdominal:     General: There is no distension.     Palpations: Abdomen is soft.     Tenderness: There is no abdominal tenderness. There is no right CVA tenderness, left CVA tenderness or guarding.  Musculoskeletal:        General: Tenderness present. No swelling, deformity or signs of injury.     Right lower leg: No edema.     Left lower leg: No edema.     Comments: Tenderness on range of motion of the right knee.  Skin warm and dry.  No redness noted  Skin:    General: Skin is warm and dry.     Capillary Refill: Capillary refill takes less than 2 seconds.     Coloration: Skin is not jaundiced or pale.     Findings: No bruising, erythema or lesion.  Neurological:     Mental Status: She is alert and oriented to person, place, and time.     Motor: No weakness.     Coordination: Coordination normal.     Gait: Gait normal.  Psychiatric:        Mood and Affect: Mood normal.  Behavior: Behavior normal.        Thought Content: Thought content normal.        Judgment: Judgment normal.     BP 123/82   Pulse 71   Temp (!) 97.2 F (36.2 C)   Wt 297 lb (134.7 kg)   SpO2 99%   BMI 47.94 kg/m  Wt  Readings from Last 3 Encounters:  03/25/23 297 lb (134.7 kg)  02/25/23 295 lb 3.2 oz (133.9 kg)  01/11/23 286 lb 9.6 oz (130 kg)    Lab Results  Component Value Date   TSH 1.680 12/14/2022   Lab Results  Component Value Date   WBC 7.5 01/11/2023   HGB 13.4 01/11/2023   HCT 41.3 01/11/2023   MCV 86.9 01/11/2023   PLT 327 01/11/2023   Lab Results  Component Value Date   NA 139 03/11/2023   K 4.0 03/11/2023   CO2 24 03/11/2023   GLUCOSE 128 (H) 03/11/2023   BUN 12 03/11/2023   CREATININE 0.65 03/11/2023   BILITOT 1.0 01/08/2023   ALKPHOS 102 01/08/2023   AST 43 (H) 01/08/2023   ALT 32 01/08/2023   PROT 8.5 (H) 01/08/2023   ALBUMIN 3.2 (L) 01/08/2023   CALCIUM 9.4 03/11/2023   ANIONGAP 13 01/11/2023   EGFR 102 03/11/2023   Lab Results  Component Value Date   CHOL 291 (H) 02/25/2023   Lab Results  Component Value Date   HDL 45 02/25/2023   Lab Results  Component Value Date   LDLCALC 203 (H) 02/25/2023   Lab Results  Component Value Date   TRIG 221 (H) 02/25/2023   Lab Results  Component Value Date   CHOLHDL 6.5 (H) 02/25/2023   Lab Results  Component Value Date   HGBA1C 8.9 (A) 03/25/2023      Assessment & Plan:   Problem List Items Addressed This Visit       Cardiovascular and Mediastinum   Hypertension - Primary    BP Readings from Last 3 Encounters:  03/25/23 123/82  02/25/23 136/78  01/25/23 128/68   HTN Controlled .  On atenolol 100 mg daily, amlodipine 10 mg daily, spironolactone 12.5 mg daily She denies chest pain dizziness syncope Continue current medications. No changes in management. Discussed DASH diet and dietary sodium restrictions Continue to increase dietary efforts and exercise.         Relevant Medications   spironolactone (ALDACTONE) 25 MG tablet   amLODipine (NORVASC) 5 MG tablet   atenolol (TENORMIN) 100 MG tablet     Endocrine   Uncontrolled type 2 diabetes mellitus with hyperglycemia (HCC)    Lab Results   Component Value Date   HGBA1C 8.9 (A) 03/25/2023  Much improved from 13.9 much improved from 13.9 3 months ago Patient congratulated on her efforts at getting her diabetes under control Has had 1 dose of Trulicity 1.5mg  once weekly injection Continue glimepiride 4 mg daily continue Trulicity 1.5 mg once weekly injection She denies hypoglycemia Continue low-carb modified diet Regular moderate exercises at least 150 minutes weekly as tolerated CBG goals discussed Appreciate collaboration with the clinical pharmacist      Relevant Medications   spironolactone (ALDACTONE) 25 MG tablet   glimepiride (AMARYL) 4 MG tablet   Other Relevant Orders   POCT glycosylated hemoglobin (Hb A1C) (Completed)   Lipid panel     Other   Dyslipidemia, goal LDL below 70    Lab Results  Component Value Date   CHOL 291 (H) 02/25/2023  HDL 45 02/25/2023   LDLCALC 203 (H) 02/25/2023   TRIG 221 (H) 02/25/2023   CHOLHDL 6.5 (H) 02/25/2023  Currently on Crestor 40 mg daily, Zetia 10 mg daily Check fasting lipid panel in 2 weeks      Relevant Medications   spironolactone (ALDACTONE) 25 MG tablet   amLODipine (NORVASC) 5 MG tablet   atenolol (TENORMIN) 100 MG tablet   Chronic pain of right knee    Continue gabapentin 100 mg 3 times daily Meloxicam 15 mg daily as needed Application of heat or ice encouraged Declined orthopedic referral       Meds ordered this encounter  Medications   spironolactone (ALDACTONE) 25 MG tablet    Sig: Take 0.5 tablets (12.5 mg total) by mouth daily.    Dispense:  90 tablet    Refill:  1   amLODipine (NORVASC) 5 MG tablet    Sig: Take 1 tablet (5 mg total) by mouth daily.    Dispense:  90 tablet    Refill:  1   atenolol (TENORMIN) 100 MG tablet    Sig: Take 1 tablet (100 mg total) by mouth daily.    Dispense:  90 tablet    Refill:  1   glimepiride (AMARYL) 4 MG tablet    Sig: Take 1 tablet (4 mg total) by mouth daily before breakfast.    Dispense:  60  tablet    Refill:  1    Follow-up: Return in about 3 months (around 06/25/2023) for DM, HTN.    Donell Beers, FNP

## 2023-03-25 NOTE — Assessment & Plan Note (Addendum)
Continue gabapentin 100 mg 3 times daily Meloxicam 15 mg daily as needed Application of heat or ice encouraged Declined orthopedic referral

## 2023-03-25 NOTE — Assessment & Plan Note (Signed)
Lab Results  Component Value Date   CHOL 291 (H) 02/25/2023   HDL 45 02/25/2023   LDLCALC 203 (H) 02/25/2023   TRIG 221 (H) 02/25/2023   CHOLHDL 6.5 (H) 02/25/2023  Currently on Crestor 40 mg daily, Zetia 10 mg daily Check fasting lipid panel in 2 weeks

## 2023-03-25 NOTE — Patient Instructions (Addendum)
Please consider getting your shingles vaccine and TDAP vaccines at the pharmacy   Goal for fasting blood sugar ranges from 80 to 120 and 2 hours after any meal or at bedtime should be between 130 to 170.    It is important that you exercise regularly at least 30 minutes 5 times a week as tolerated  Think about what you will eat, plan ahead. Choose " clean, green, fresh or frozen" over canned, processed or packaged foods which are more sugary, salty and fatty. 70 to 75% of food eaten should be vegetables and fruit. Three meals at set times with snacks allowed between meals, but they must be fruit or vegetables. Aim to eat over a 12 hour period , example 7 am to 7 pm, and STOP after  your last meal of the day. Drink water,generally about 64 ounces per day, no other drink is as healthy. Fruit juice is best enjoyed in a healthy way, by EATING the fruit.  Thanks for choosing [facility] we consider it a privelige to serve you.

## 2023-03-25 NOTE — Telephone Encounter (Signed)
Please advise Kh 

## 2023-03-25 NOTE — Assessment & Plan Note (Addendum)
Lab Results  Component Value Date   HGBA1C 8.9 (A) 03/25/2023  Much improved from 13.9 much improved from 13.9 3 months ago Patient congratulated on her efforts at getting her diabetes under control Has had 1 dose of Trulicity 1.5mg  once weekly injection Continue glimepiride 4 mg daily continue Trulicity 1.5 mg once weekly injection She denies hypoglycemia Continue low-carb modified diet Regular moderate exercises at least 150 minutes weekly as tolerated CBG goals discussed Appreciate collaboration with the clinical pharmacist

## 2023-03-25 NOTE — Assessment & Plan Note (Signed)
BP Readings from Last 3 Encounters:  03/25/23 123/82  02/25/23 136/78  01/25/23 128/68   HTN Controlled .  On atenolol 100 mg daily, amlodipine 10 mg daily, spironolactone 12.5 mg daily She denies chest pain dizziness syncope Continue current medications. No changes in management. Discussed DASH diet and dietary sodium restrictions Continue to increase dietary efforts and exercise.

## 2023-03-29 ENCOUNTER — Other Ambulatory Visit: Payer: Self-pay | Admitting: Nurse Practitioner

## 2023-03-29 ENCOUNTER — Other Ambulatory Visit: Payer: 59 | Admitting: Pharmacist

## 2023-03-29 DIAGNOSIS — E785 Hyperlipidemia, unspecified: Secondary | ICD-10-CM

## 2023-03-29 NOTE — Patient Instructions (Addendum)
Nancy Pham,   It was great talking to you today!  Check your blood sugars twice daily:  1) Fasting, first thing in the morning before breakfast and  2) 2 hours after your largest meal.   For a goal A1c of less than 7%, goal fasting readings are less than 130 and goal 2 hour after meal readings are less than 180.    Moving forward, we will work together to increase your Trulicity and hopefully decrease or discontinue the glimepiride.   Please reach out with any questions,   Catie TClearance Coots, PharmD, BCACP, CPP Clinical Pharmacist [facility] [phone]

## 2023-03-29 NOTE — Progress Notes (Signed)
03/29/2023 Name: Nashayla Dec MRN: [id number] DOB: [birth year]  Chief Complaint  Patient presents with   Medication Management   Hypertension   Hyperlipidemia   Diabetes    Kylie Halbrooks is a 57 y.o. year old female who presented for a telephone visit.   They were referred to the pharmacist by their PCP for assistance in managing diabetes, hypertension, and hyperlipidemia.    Subjective:  Care Team: Primary Care Provider: Donell Beers, FNP ; Next Scheduled Visit: 06/27/23  Medication Access/Adherence  Current Pharmacy:  [id number] - [place], [place] - [address] [place] [address] Phone: [phone] Fax: [phone]   Patient reports affordability concerns with their medications: No  Patient reports access/transportation concerns to their pharmacy: Yes  Patient reports adherence concerns with their medications:  No     Diabetes:  Current medications: glimepiride 4 mg daily, Trulicity 1.5 mg weekly x 1 week  Medications tried in the past: both XR and IR metformin - nausea/vomiting   Denies stomach upset, constipation, constipation. Denies a significant impact on  Current glucose readings: has a machine, but needs to pick up test strips.   Patient denies hypoglycemic s/sx including dizziness, shakiness, sweating.   Hypertension:  Current medications: amlodipine 5 mg daily, atenolol 100 mg daily, spironolactone 12.5 mg daily   Patient does not have a validated, automated, upper arm home BP cuff. Has a wrist cuff.   Denies any s/sx hypotension, including lightheadedness, dizziness  Hyperlipidemia/ASCVD Risk Reduction  Current lipid lowering medications: rosuvastatin 40 mg daily, ezetimibe 10 mg daily   Antiplatelet regimen: aspirin 81 mg daily   Confirms she has been taking rosuvastatin daily. Fill history supports adherence, though increase in LDL since last year's labs does not correlate. PCP scheduled fasting lipid  panel next week   Objective:  Lab Results  Component Value Date   HGBA1C 8.9 (A) 03/25/2023    Lab Results  Component Value Date   CREATININE 0.65 03/11/2023   BUN 12 03/11/2023   NA 139 03/11/2023   K 4.0 03/11/2023   CL 102 03/11/2023   CO2 24 03/11/2023    Lab Results  Component Value Date   CHOL 291 (H) 02/25/2023   HDL 45 02/25/2023   LDLCALC 203 (H) 02/25/2023   TRIG 221 (H) 02/25/2023   CHOLHDL 6.5 (H) 02/25/2023    Medications Reviewed Today     Reviewed by Alden Hipp, RPH-CPP (Pharmacist) on 03/29/23 at 1131  Med List Status: <None>   Medication Order Taking? Sig Documenting Provider Last Dose Status Informant  acetaminophen (TYLENOL) 325 MG tablet [id number] Yes Take 325-650 mg by mouth every 6 (six) hours as needed for mild pain, fever or headache.  [provider] Taking Active Self           Med Note Clearance Coots, Alanda Slim Dec 23, 2022 10:24 AM)    albuterol (VENTOLIN HFA) 108 (90 Base) MCG/ACT inhaler [id number] No Inhale 1-2 puffs into the lungs every 6 (six) hours as needed for wheezing or shortness of breath. Please dispense 3 months supple.  Patient not taking: Reported on 03/29/2023   [name], FNP Not Taking Active            Med Note Sherilyn Cooter, [facility]   Fri Feb 25, 2023 10:56 AM) Prn   allopurinol (ZYLOPRIM) 300 MG tablet [id number] Yes Take 1 tablet (300 mg total) by mouth daily. Donell Beers, FNP Taking Active   amLODipine (NORVASC)  5 MG tablet [id number] Yes Take 1 tablet (5 mg total) by mouth daily. Donell Beers, FNP Taking Active   aspirin EC 81 MG tablet [id number] Yes Take 1 tablet (81 mg total) by mouth daily. [name], FNP Taking Active   atenolol (TENORMIN) 100 MG tablet [id number] Yes Take 1 tablet (100 mg total) by mouth daily. Donell Beers, FNP Taking Active   Blood Glucose Monitoring Suppl DEVI [id number]  Use to check up to twice a day. May substitute to any manufacturer covered by  patient's insurance. Donell Beers, FNP  Active   cetirizine (ZYRTEC) 10 MG tablet [id number] Yes Take 10 mg by mouth daily. [provider] Taking Active   Cholecalciferol (VITAMIN D3) 125 MCG (5000 UT) CAPS [id number] Yes Take 1 capsule (5,000 Units total) by mouth daily. [name], FNP Taking Active   cloNIDine (CATAPRES) tablet 0.2 mg [id number]   Barbette Merino, NP  Active   Dulaglutide (TRULICITY) 1.5 MG/0.5ML Namon Cirri [id number] Yes Inject 1.5 mg into the skin once a week. Donell Beers, FNP Taking Active   ezetimibe (ZETIA) 10 MG tablet [id number] Yes Take 1 tablet (10 mg total) by mouth daily. Donell Beers, FNP Taking Active   famotidine (PEPCID) 20 MG tablet [id number] Yes Take 1 tablet (20 mg total) by mouth daily. Barbette Merino, NP Taking Active   ferrous sulfate 325 (65 FE) MG tablet [id number] Yes Take 1 tablet (325 mg total) by mouth daily with breakfast. [name], FNP Taking Active   fluticasone [facility]) 50 MCG/ACT nasal spray [id number] Yes Place 2 sprays into both nostrils daily as needed for allergies or rhinitis. [name], FNP Taking Active            Med Note Clearance Coots, Alanda Slim Dec 23, 2022 10:24 AM)    gabapentin (NEURONTIN) 100 MG capsule [id number] Yes Take 1 capsule (100 mg total) by mouth 3 (three) times daily. Donell Beers, FNP Taking Active   glimepiride (AMARYL) 4 MG tablet [id number] Yes Take 1 tablet (4 mg total) by mouth daily before breakfast. Donell Beers, FNP Taking Active   Glucose Blood (BLOOD GLUCOSE TEST STRIPS) STRP [id number]  Use to check up to twice a day. May substitute to any manufacturer covered by patient's insurance.  Patient not taking: Reported on 02/25/2023   Donell Beers, FNP  Active   hydrocortisone cream (HYDROCORTISONE ANTI-ITCH) 1 % [id number]  Apply 1 application topically 2 (two) times daily.  Patient not taking: Reported on 12/14/2022   [name], FNP  Active    Lancet Device MISC [id number]  Use to check up to twice a day. May substitute to any manufacturer covered by patient's insurance.  Patient not taking: Reported on 03/25/2023   Donell Beers, FNP  Active   Lancets Misc. MISC [id number]  Use to check up to twice a day. May substitute to any manufacturer covered by patient's insurance.  Patient not taking: Reported on 02/25/2023   Donell Beers, FNP  Active     Discontinued 08/15/19 1518          Med Note Antony Madura, Dyane Dustman Aug 21, 2017  4:16 PM) Regimen verified by the patient  meloxicam (MOBIC) 15 MG tablet [id number] Yes TAKE 1 TABLET BY MOUTH EVERY DAY AS NEEDED FOR PAIN Donell Beers, FNP Taking Active   Multiple Vitamins-Minerals (CENTRUM SILVER 50+WOMEN) TABS [id number] Yes Take 1 tablet by  mouth daily. Barbette Merino, NP Taking Active   Omega-3 Fatty Acids (FISH OIL PO) [id number] Yes Take 1 capsule by mouth daily. [provider] Taking Active   Probiotic Product (PROBIOTIC DAILY PO) [id number] Yes Take 1 capsule by mouth daily. [provider] Taking Active Self  rosuvastatin (CRESTOR) 40 MG tablet [id number] Yes Take 1 tablet (40 mg total) by mouth daily. Donell Beers, FNP Taking Active   spironolactone (ALDACTONE) 25 MG tablet [id number] Yes Take 0.5 tablets (12.5 mg total) by mouth daily. Donell Beers, FNP Taking Active               Assessment/Plan:   Medication Management: - S[facility]ed [facility], and call insurance to inquire about mail order, due to reported transportation barriers. She will do this    Diabetes: - Currently uncontrolled but improved. Just recent increased to Trulicity 1.5 mg weekly, so will continue current regimen at this time. Goal to titrate Trulicity to max tolerated dose, reduce/eliminate glimepiride - Recommend to check glucose daily. [facility] and will start to check.   Hypertension: - Currently  controlled - Reviewed appropriate blood pressure monitoring technique and reviewed goal blood pressure. Recommended to check home blood pressure and heart rate periodically. Will discuss obtaining arm cuff moving forward, instead of wrist cuff  Hyperlipidemia/ASCVD Risk Reduction: - Currently uncontrolled. Ezetimibe recently started, though unlikely to get to goal <70 if LDL is in 200s. Fasting lipid panel ordered, consider adding direct LDL as well. Likely will need PCSK9i to get to goal.  - Recommend to continue current regimen at this time.   Follow Up Plan: phone call in 4 weeks  Catie TClearance Coots, PharmD, BCACP, CPP Clinical Pharmacist [facility] [phone]

## 2023-04-08 ENCOUNTER — Other Ambulatory Visit: Payer: 59

## 2023-04-08 DIAGNOSIS — E785 Hyperlipidemia, unspecified: Secondary | ICD-10-CM

## 2023-04-08 DIAGNOSIS — E1165 Type 2 diabetes mellitus with hyperglycemia: Secondary | ICD-10-CM

## 2023-04-09 LAB — LIPID PANEL
Chol/HDL Ratio: 3 ratio (ref 0.0–4.4)
Cholesterol, Total: 139 mg/dL (ref 100–199)
HDL: 47 mg/dL (ref >39)
LDL Chol Calc (NIH): 64 mg/dL (ref 0–99)
Triglycerides: 163 mg/dL — ABNORMAL HIGH (ref 0–149)
VLDL Cholesterol Cal: 28 mg/dL (ref 5–40)

## 2023-04-09 LAB — LDL CHOLESTEROL, DIRECT: LDL Direct: 62 mg/dL (ref 0–99)

## 2023-04-11 ENCOUNTER — Other Ambulatory Visit: Payer: Self-pay | Admitting: Nurse Practitioner

## 2023-04-11 DIAGNOSIS — E785 Hyperlipidemia, unspecified: Secondary | ICD-10-CM

## 2023-04-11 MED ORDER — ROSUVASTATIN CALCIUM 40 MG PO TABS: 40.0000 mg | ORAL_TABLET | Freq: Every day | ORAL | 1 refills | Status: DC

## 2023-04-26 ENCOUNTER — Other Ambulatory Visit: Payer: 59

## 2023-04-26 ENCOUNTER — Telehealth: Payer: Self-pay | Admitting: Pharmacist

## 2023-04-26 NOTE — Progress Notes (Unsigned)
Attempted to contact patient for scheduled appointment for medication management. Left HIPAA compliant message for patient to return my call at their convenience.   Will send MyChart.  Catie T. Harper, PharmD, BCACP, CPP Clinical Pharmacist [facility] [phone]  

## 2023-05-01 ENCOUNTER — Other Ambulatory Visit: Payer: Self-pay | Admitting: Nurse Practitioner

## 2023-05-01 DIAGNOSIS — G8929 Other chronic pain: Secondary | ICD-10-CM

## 2023-05-05 ENCOUNTER — Ambulatory Visit: Payer: Self-pay | Admitting: Podiatry

## 2023-05-10 ENCOUNTER — Other Ambulatory Visit (INDEPENDENT_AMBULATORY_CARE_PROVIDER_SITE_OTHER): Payer: 59 | Admitting: Pharmacist

## 2023-05-10 DIAGNOSIS — I10 Essential (primary) hypertension: Secondary | ICD-10-CM

## 2023-05-10 MED ORDER — TRULICITY 3 MG/0.5ML ~~LOC~~ SOAJ: 3.0000 mg | SUBCUTANEOUS | 2 refills | Status: DC

## 2023-05-10 NOTE — Progress Notes (Signed)
05/10/2023 Name: Nancy Pham MRN: [id number] DOB: 1966/01/04  Chief Complaint  Patient presents with   Medication Management    Nancy Pham is a 57 y.o. year old female who presented for a telephone visit.   They were referred to the pharmacist by their PCP for assistance in managing diabetes, hypertension, and hyperlipidemia.    Subjective:  Care Team: Primary Care Provider: Donell Beers, FNP ; Next Scheduled Visit: 06/27/23  Medication Access/Adherence  Current Pharmacy:  [id number] - [place], [place] - [address] [place] [address] Phone: [phone] Fax: [phone]   Patient reports affordability concerns with their medications: No  Patient reports access/transportation concerns to their pharmacy: No  Patient reports adherence concerns with their medications:  No     Diabetes:  Current medications: Trulicity 1.5 mg weekly, glimepiride 4 mg weekly   Current glucose readings: reports one fasting reading of 89; some post prandial readings ~ 189-230s  Patient denies hypoglycemic s/sx including dizziness, shakiness, sweating.   Hypertension:  Current medications: amlodipine 5 mg daily, atenolol 100 mg daily, spironolactone 12.5 mg daily  Prior medications: lip/tongue swelling with lisinopril   Reports continued swelling in her feet/ankles, significantly worsened since stopping furosemide (previously on and experienced hypokalemia). Notes spironolactone has helped some, but swelling is still present. Bilateral, no redness/pain.   Hyperlipidemia/ASCVD Risk Reduction  Current lipid lowering medications: rosuvastatin 40 mg daily, ezetimibe 10 mg daily  Antiplatelet regimen: aspirin 81 mg daily  Objective:  Lab Results  Component Value Date   HGBA1C 8.9 (A) 03/25/2023    Lab Results  Component Value Date   CREATININE 0.65 03/11/2023   BUN 12 03/11/2023   NA 139 03/11/2023   K 4.0 03/11/2023   CL 102  03/11/2023   CO2 24 03/11/2023    Lab Results  Component Value Date   CHOL 139 04/08/2023   HDL 47 04/08/2023   LDLCALC 64 04/08/2023   LDLDIRECT 62 04/08/2023   TRIG 163 (H) 04/08/2023   CHOLHDL 3.0 04/08/2023    Medications Reviewed Today     Reviewed by Alden Hipp, RPH-CPP (Pharmacist) on 05/10/23 at 1522  Med List Status: <None>   Medication Order Taking? Sig Documenting Provider Last Dose Status Informant  acetaminophen (TYLENOL) 325 MG tablet [id number]  Take 325-650 mg by mouth every 6 (six) hours as needed for mild pain, fever or headache.  [provider]  Active Self           Med Note Clearance Coots, Alanda Slim Dec 23, 2022 10:24 AM)    albuterol (VENTOLIN HFA) 108 (90 Base) MCG/ACT inhaler [id number]  Inhale 1-2 puffs into the lungs every 6 (six) hours as needed for wheezing or shortness of breath. Please dispense 3 months supple.  Patient not taking: Reported on 03/29/2023   [name], FNP  Active            Med Note Sherilyn Cooter, [facility]   Fri Feb 25, 2023 10:56 AM) Prn   allopurinol (ZYLOPRIM) 300 MG tablet [id number] Yes Take 1 tablet (300 mg total) by mouth daily. Donell Beers, FNP Taking Active   amLODipine (NORVASC) 5 MG tablet [id number] Yes Take 1 tablet (5 mg total) by mouth daily. Donell Beers, FNP Taking Active   aspirin EC 81 MG tablet [id number] Yes Take 1 tablet (81 mg total) by mouth daily. [name], FNP Taking Active   atenolol (TENORMIN) 100 MG tablet [id number] Yes Take 1  tablet (100 mg total) by mouth daily. Donell Beers, FNP Taking Active   Blood Glucose Monitoring Suppl DEVI [id number]  Use to check up to twice a day. May substitute to any manufacturer covered by patient's insurance. Donell Beers, FNP  Active   cetirizine (ZYRTEC) 10 MG tablet [id number] Yes Take 10 mg by mouth daily. [provider] Taking Active   Cholecalciferol (VITAMIN D3) 125 MCG (5000 UT) CAPS [id number] Yes Take 1  capsule (5,000 Units total) by mouth daily. [name], FNP Taking Active   cloNIDine (CATAPRES) tablet 0.2 mg [id number]   Barbette Merino, NP  Active   Dulaglutide (TRULICITY) 1.5 MG/0.5ML Namon Cirri [id number] Yes Inject 1.5 mg into the skin once a week. Donell Beers, FNP Taking Active   ezetimibe (ZETIA) 10 MG tablet [id number] Yes Take 1 tablet (10 mg total) by mouth daily. Donell Beers, FNP Taking Active   famotidine (PEPCID) 20 MG tablet [id number] Yes Take 1 tablet (20 mg total) by mouth daily. Barbette Merino, NP Taking Active   ferrous sulfate 325 (65 FE) MG tablet [id number] Yes Take 1 tablet (325 mg total) by mouth daily with breakfast. [name], FNP Taking Active   fluticasone [facility]) 50 MCG/ACT nasal spray [id number] Yes Place 2 sprays into both nostrils daily as needed for allergies or rhinitis. [name], FNP Taking Active            Med Note Clearance Coots, Alanda Slim Dec 23, 2022 10:24 AM)    gabapentin (NEURONTIN) 100 MG capsule [id number] Yes Take 1 capsule (100 mg total) by mouth 3 (three) times daily. Donell Beers, FNP Taking Active   glimepiride (AMARYL) 4 MG tablet [id number] Yes Take 1 tablet (4 mg total) by mouth daily before breakfast. Donell Beers, FNP Taking Active   Glucose Blood (BLOOD GLUCOSE TEST STRIPS) STRP [id number]  Use to check up to twice a day. May substitute to any manufacturer covered by patient's insurance.  Patient not taking: Reported on 02/25/2023   Donell Beers, FNP  Active   hydrocortisone cream (HYDROCORTISONE ANTI-ITCH) 1 % [id number]  Apply 1 application topically 2 (two) times daily.  Patient not taking: Reported on 12/14/2022   [name], FNP  Active   Lancet Device MISC [id number]  Use to check up to twice a day. May substitute to any manufacturer covered by patient's insurance.  Patient not taking: Reported on 03/25/2023   Donell Beers, FNP  Active   Lancets Misc. MISC [id number]  Use to  check up to twice a day. May substitute to any manufacturer covered by patient's insurance.  Patient not taking: Reported on 02/25/2023   Donell Beers, FNP  Active     Discontinued 08/15/19 1518          Med Note Antony Madura, Dyane Dustman Aug 21, 2017  4:16 PM) Regimen verified by the patient  meloxicam (MOBIC) 15 MG tablet [id number] Yes TAKE 1 TABLET BY MOUTH EVERY DAY AS NEEDED FOR PAIN Donell Beers, FNP Taking Active   Multiple Vitamins-Minerals (CENTRUM SILVER 50+WOMEN) TABS [id number] Yes Take 1 tablet by mouth daily. Barbette Merino, NP Taking Active   Omega-3 Fatty Acids (FISH OIL PO) [id number] Yes Take 1 capsule by mouth daily. [provider] Taking Active   Probiotic Product (PROBIOTIC DAILY PO) [id number] Yes Take 1 capsule by mouth daily. [provider] Taking Active Self  rosuvastatin (CRESTOR) 40 MG  tablet [id number] Yes Take 1 tablet (40 mg total) by mouth daily. Donell Beers, FNP Taking Active   spironolactone (ALDACTONE) 25 MG tablet [id number] Yes Take 0.5 tablets (12.5 mg total) by mouth daily. Donell Beers, FNP Taking Active               Assessment/Plan:   Diabetes: - Currently uncontrolled - Reviewed long term cardiovascular and renal outcomes of uncontrolled blood sugar - Reviewed goal A1c, goal fasting, and goal 2 hour post prandial glucose - Recommend to increase Trulicity to 3 mg weekly, reduce glimepiride to 2 mg daily to reduce risk of hypoglycemia. Discussed with PCP, she is in agreement. Orders placed.  - Recommend to check glucose twice daily, fasting and 2 hour post prandial.   Hypertension: - Currently controlled but with possible side effects of therapy.  - Reviewed long term cardiovascular and renal outcomes of uncontrolled blood pressure - Placed order for home BP cuff through [facility] to be billed on Medicaid.  - Recommend to hold amlodipine for 1-2 weeks to see if improvement in swelling. Options  include increasing spironolactone if needed for edema or blood pressure control, though would check BMP ~ 2 weeks after dose increase.     Hyperlipidemia/ASCVD Risk Reduction: - Currently controlled.  - Recommend to continue current regimen at this time    Follow Up Plan: follow up call in ~ 4 weeks  Catie Eppie Gibson, PharmD, BCACP, CPP Clinical Pharmacist [facility] [phone]

## 2023-05-11 ENCOUNTER — Encounter: Payer: Self-pay | Admitting: Podiatry

## 2023-05-11 ENCOUNTER — Ambulatory Visit: Payer: 59 | Admitting: Podiatry

## 2023-05-11 DIAGNOSIS — E1165 Type 2 diabetes mellitus with hyperglycemia: Secondary | ICD-10-CM | POA: Diagnosis not present

## 2023-05-11 DIAGNOSIS — M79674 Pain in right toe(s): Secondary | ICD-10-CM | POA: Diagnosis not present

## 2023-05-11 DIAGNOSIS — B351 Tinea unguium: Secondary | ICD-10-CM

## 2023-05-11 DIAGNOSIS — M79675 Pain in left toe(s): Secondary | ICD-10-CM

## 2023-05-11 NOTE — Progress Notes (Signed)
  Subjective:  Patient ID: Nancy Pham, female    DOB: 19-Feb-1966,   MRN: [id number]  No chief complaint on file.   57 y.o. female presents for concern of thickened elongated and painful nails that are difficult to trim. Requesting to have them trimmed today. Relates burning and tingling in their feet. Patient is diabetic and last A1c was  Lab Results  Component Value Date   HGBA1C 8.9 (A) 03/25/2023   .   PCP:  Donell Beers, FNP    . Denies any other pedal complaints. Denies n/v/f/c.   Past Medical History:  Diagnosis Date   Asthma    Cancer (HCC)    Chronic pain of right knee    Diabetes mellitus    Gout    History of cervical cancer    History of ovarian cancer    Hyperlipidemia    Hyperlipidemia LDL goal <70    Hypertension    Obesity     Objective:  Physical Exam: Vascular: DP/PT pulses 2/4 bilateral. CFT <3 seconds. Absent hair growth on digits. Edema noted to bilateral lower extremities. Xerosis noted bilaterally.  Skin. No lacerations or abrasions bilateral feet. Nails 1-5 bilateral  are thickened discolored and elongated with subungual debris.  Musculoskeletal: MMT 5/5 bilateral lower extremities in DF, PF, Inversion and Eversion. Deceased ROM in DF of ankle joint.  Neurological: Sensation intact to light touch. Protective sensation diminished bilateral.    Assessment:   1. Pain due to onychomycosis of toenails of both feet   2. Uncontrolled type 2 diabetes mellitus with hyperglycemia (HCC)      Plan:  Patient was evaluated and treated and all questions answered. -Discussed and educated patient on diabetic foot care, especially with  regards to the vascular, neurological and musculoskeletal systems.  -Stressed the importance of good glycemic control and the detriment of not  controlling glucose levels in relation to the foot. -Discussed supportive shoes at all times and checking feet regularly.  -Mechanically debrided all nails 1-5 bilateral using  sterile nail nipper and filed with dremel without incident  -Answered all patient questions -Patient to return  in 3 months for at risk foot care -Patient advised to call the office if any problems or questions arise in the meantime.   Louann Sjogren, DPM

## 2023-05-13 NOTE — Patient Instructions (Addendum)
Ms. Recktenwald,   It was great talking to you!  Increase Trulicity to 3 mg weekly. Decrease glimepiride to 2 mg (1/2 tablet) to reduce your risk of low blood sugars.   Try stopping your amlodipine for the next 2 weeks to see if you have any change in your swelling. If that doesn't help, we'll talk about next steps.   Please reach out if you have any questions or concerns!  Catie Eppie Gibson, PharmD, BCACP, CPP Clinical Pharmacist [facility] [phone]

## 2023-05-17 ENCOUNTER — Other Ambulatory Visit: Payer: 59

## 2023-06-09 ENCOUNTER — Other Ambulatory Visit: Payer: 59 | Admitting: Pharmacist

## 2023-06-09 DIAGNOSIS — I10 Essential (primary) hypertension: Secondary | ICD-10-CM

## 2023-06-09 DIAGNOSIS — E119 Type 2 diabetes mellitus without complications: Secondary | ICD-10-CM

## 2023-06-09 NOTE — Progress Notes (Signed)
06/09/2023 Name: Nancy Pham MRN: [id number] DOB: 14-Nov-1965  Chief Complaint  Patient presents with   Hyperlipidemia   Hypertension   Diabetes    Nancy Pham is a 57 y.o. year old female who presented for a telephone visit.   They were referred to the pharmacist by their PCP for assistance in managing diabetes, hypertension, and hyperlipidemia.  Subjective:  Care Team: Primary Care Provider: Donell Beers, FNP ; Next Scheduled Visit: 06/27/2023  Medication Access/Adherence  Current Pharmacy:  [id number] - [place], [place] - [address] [place] [address] Phone: [phone] Fax: [phone]   Diabetes:  Current medications: Trulicity to 3 mg weekly and glimepiride 2 mg daily  Medications tried in the past: both XR and IR metformin - nausea/vomiting - First dose of increased Trulicity was last Friday. Reported that it was a $22 co-pay - Also reports taking glimepiride 4 mg daily (did not reduce to 2 mg)  Has been in [facility] since Wed and hasn't been checking blood glucose. Unsure if she brought meter with her.   Patient denies hypoglycemic s/sx including dizziness, shakiness, sweating. Patient denies hyperglycemic symptoms including polyuria, polydipsia, polyphagia, nocturia, neuropathy, blurred vision.  Current meal patterns (2-3 meals/day):  - Breakfast: cereal but typically doesn't eat  - Lunch: now eating salads - Supper: string beans, corns, broccoli  - Snacks: doesn't have typically  - Drinks: water   Current physical activity: doesn't exercise    Hypertension:  Current medications: amlodipine 5 mg daily, atenolol 100 mg daily, spironolactone 12.5 mg daily  Prior medications: lip/tongue swelling with lisinopril  - Held the amlodipine for the last 2 weeks due to experiencing swelling in feet and ankles as advised. Reports the swelling has improved   Patient does not have a validated, automated, upper arm home BP cuff.  Has a wrist BP cuff. Current blood pressure readings readings: hasn't been checking at home   Patient denies hypotensive s/sx including dizziness, lightheadedness.  Patient denies hypertensive symptoms including headache, chest pain, shortness of breath  Hyperlipidemia/ASCVD Risk Reduction  Current lipid lowering medications: rosuvastatin 40 mg daily, ezetimibe 10 mg daily   Antiplatelet regimen: aspirin 81 mg daily  ASCVD History: none Family History: heart attack (mother and father) Risk Factors: HLD, HTN, DM  The 10-year ASCVD risk score (Arnett DK, et al., 2019) is: 9.9%   Values used to calculate the score:     Age: 40 years     Sex: Female     Is Non-Hispanic African American: Yes     Diabetic: Yes     Tobacco smoker: No     Systolic Blood Pressure: 123 mmHg     Is BP treated: Yes     HDL Cholesterol: 47 mg/dL     Total Cholesterol: 139 mg/dL    Objective:  Lab Results  Component Value Date   HGBA1C 8.9 (A) 03/25/2023    Lab Results  Component Value Date   CREATININE 0.65 03/11/2023   BUN 12 03/11/2023   NA 139 03/11/2023   K 4.0 03/11/2023   CL 102 03/11/2023   CO2 24 03/11/2023    Lab Results  Component Value Date   CHOL 139 04/08/2023   HDL 47 04/08/2023   LDLCALC 64 04/08/2023   LDLDIRECT 62 04/08/2023   TRIG 163 (H) 04/08/2023   CHOLHDL 3.0 04/08/2023    Medications Reviewed Today     Reviewed by Roslyn Smiling, [facility] (Pharmacist) on 06/09/23 at 1415  Med List  Status: <None>   Medication Order Taking? Sig Documenting Provider Last Dose Status Informant  acetaminophen (TYLENOL) 325 MG tablet [id number] Yes Take 325-650 mg by mouth every 6 (six) hours as needed for mild pain, fever or headache.  [provider] Taking Active Self           Med Note Clearance Coots, Alanda Slim Dec 23, 2022 10:24 AM)    albuterol (VENTOLIN HFA) 108 (90 Base) MCG/ACT inhaler [id number] Yes Inhale 1-2 puffs into the lungs every 6 (six) hours as needed for  wheezing or shortness of breath. Please dispense 3 months supple. [name], FNP Taking Active            Med Note Sherilyn Cooter, [facility]   Fri Feb 25, 2023 10:56 AM) Prn   allopurinol (ZYLOPRIM) 300 MG tablet [id number] Yes Take 1 tablet (300 mg total) by mouth daily. Donell Beers, FNP Taking Active   amLODipine (NORVASC) 5 MG tablet [id number]  Take 1 tablet (5 mg total) by mouth daily. Donell Beers, FNP  Active   aspirin EC 81 MG tablet [id number] Yes Take 1 tablet (81 mg total) by mouth daily. [name], FNP Taking Active   atenolol (TENORMIN) 100 MG tablet [id number] Yes Take 1 tablet (100 mg total) by mouth daily. Donell Beers, FNP Taking Active   Blood Glucose Monitoring Suppl DEVI [id number]  Use to check up to twice a day. May substitute to any manufacturer covered by patient's insurance. Donell Beers, FNP  Active   cetirizine (ZYRTEC) 10 MG tablet [id number] Yes Take 10 mg by mouth daily. [provider] Taking Active   Cholecalciferol (VITAMIN D3) 125 MCG (5000 UT) CAPS [id number] Yes Take 1 capsule (5,000 Units total) by mouth daily. [name], FNP Taking Active   cloNIDine (CATAPRES) tablet 0.2 mg [id number]   Barbette Merino, NP  Active   Dulaglutide (TRULICITY) 3 MG/0.5ML Namon Cirri [id number] Yes Inject 3 mg as directed once a week. Donell Beers, FNP Taking Active   ezetimibe (ZETIA) 10 MG tablet [id number] Yes Take 1 tablet (10 mg total) by mouth daily. Donell Beers, FNP Taking Active   famotidine (PEPCID) 20 MG tablet [id number] Yes Take 1 tablet (20 mg total) by mouth daily. Barbette Merino, NP Taking Active   ferrous sulfate 325 (65 FE) MG tablet [id number] Yes Take 1 tablet (325 mg total) by mouth daily with breakfast. [name], FNP Taking Active   fluticasone [facility]) 50 MCG/ACT nasal spray [id number] Yes Place 2 sprays into both nostrils daily as needed for allergies or rhinitis. [name], FNP Taking Active             Med Note Clearance Coots, Alanda Slim Dec 23, 2022 10:24 AM)    gabapentin (NEURONTIN) 100 MG capsule [id number] Yes Take 1 capsule (100 mg total) by mouth 3 (three) times daily. Donell Beers, FNP Taking Active   glimepiride (AMARYL) 4 MG tablet [id number] Yes Take 1 tablet (4 mg total) by mouth daily before breakfast. Donell Beers, FNP Taking Active   Glucose Blood (BLOOD GLUCOSE TEST STRIPS) STRP [id number]  Use to check up to twice a day. May substitute to any manufacturer covered by patient's insurance.  Patient not taking: Reported on 02/25/2023   Donell Beers, FNP  Active   hydrocortisone cream (HYDROCORTISONE ANTI-ITCH) 1 % [id number]  Apply 1 application topically 2 (two) times daily.  Patient not taking: Reported  on 12/14/2022   [name], FNP  Active   Lancet Device MISC [id number]  Use to check up to twice a day. May substitute to any manufacturer covered by patient's insurance.  Patient not taking: Reported on 03/25/2023   Donell Beers, FNP  Active   Lancets Misc. MISC [id number]  Use to check up to twice a day. May substitute to any manufacturer covered by patient's insurance.  Patient not taking: Reported on 02/25/2023   Donell Beers, FNP  Active     Discontinued 08/15/19 1518          Med Note Antony Madura, Dyane Dustman Aug 21, 2017  4:16 PM) Regimen verified by the patient  meloxicam (MOBIC) 15 MG tablet [id number] Yes TAKE 1 TABLET BY MOUTH EVERY DAY AS NEEDED FOR PAIN Donell Beers, FNP Taking Active   Multiple Vitamins-Minerals (CENTRUM SILVER 50+WOMEN) TABS [id number] Yes Take 1 tablet by mouth daily. Barbette Merino, NP Taking Active   Omega-3 Fatty Acids (FISH OIL PO) [id number] Yes Take 1 capsule by mouth daily. [provider] Taking Active   Probiotic Product (PROBIOTIC DAILY PO) [id number] Yes Take 1 capsule by mouth daily. [provider] Taking Active Self  rosuvastatin (CRESTOR) 40 MG tablet [id number] Yes Take 1  tablet (40 mg total) by mouth daily. Donell Beers, FNP Taking Active   spironolactone (ALDACTONE) 25 MG tablet [id number] Yes Take 0.5 tablets (12.5 mg total) by mouth daily. Donell Beers, FNP Taking Active             Assessment/Plan:   Diabetes: - Currently uncontrolled based on last A1c of 8.9% - Reviewed long term cardiovascular and renal outcomes of uncontrolled blood sugar - Reviewed goal A1c, goal fasting, and goal 2 hour post prandial glucose - Reviewed lifestyle modifications including: walking during commercial breaks  - Recommend to continue Trulicity to 3 mg weekly and reduce glimepiride from 4 mg to 2 mg daily - [facility] to see why there was a $60 co-pay with Trulicity. Appears there was a change in insurance coverage for medication so advised patient to contact [employer] to see why.  - Recommend to check glucose periodically   Hypertension: - Currently controlled based on last office visit BP - Reviewed long term cardiovascular and renal outcomes of uncontrolled blood pressure - Reviewed appropriate blood pressure monitoring technique and reviewed goal blood pressure. Recommended to check home blood pressure and heart rate periodically - Recommend to stop the amlodipine 5 mg. Continue the atenolol 100 mg daily and spironolactone 12.5 mg daily   - Could consider increasing spironolactone to 25 mg daily if BP still uncontrolled once checking BP  Hyperlipidemia/ASCVD Risk Reduction: - Currently controlled  - Recommend to continue current regimen at this time   Follow Up Plan: 08/04/2023 with pharmacist   Roslyn Smiling, PharmD PGY1 Pharmacy Resident 06/09/2023 2:56 PM   I have reviewed the pharmacist's encounter and agree with their documentation.   Catie Eppie Gibson, PharmD, BCACP, CPP [facility] [phone]

## 2023-06-27 ENCOUNTER — Ambulatory Visit (INDEPENDENT_AMBULATORY_CARE_PROVIDER_SITE_OTHER): Payer: 59 | Admitting: Nurse Practitioner

## 2023-06-27 ENCOUNTER — Encounter: Payer: Self-pay | Admitting: Nurse Practitioner

## 2023-06-27 VITALS — BP 169/88 | HR 65 | Temp 97.7°F | Wt 293.0 lb

## 2023-06-27 DIAGNOSIS — E66813 Obesity, class 3: Secondary | ICD-10-CM | POA: Diagnosis not present

## 2023-06-27 DIAGNOSIS — Z6841 Body Mass Index (BMI) 40.0 and over, adult: Secondary | ICD-10-CM

## 2023-06-27 DIAGNOSIS — E1165 Type 2 diabetes mellitus with hyperglycemia: Secondary | ICD-10-CM

## 2023-06-27 DIAGNOSIS — I10 Essential (primary) hypertension: Secondary | ICD-10-CM

## 2023-06-27 DIAGNOSIS — E785 Hyperlipidemia, unspecified: Secondary | ICD-10-CM

## 2023-06-27 LAB — POCT GLYCOSYLATED HEMOGLOBIN (HGB A1C): Hemoglobin A1C: 5.9 % — AB (ref 4.0–5.6)

## 2023-06-27 MED ORDER — SPIRONOLACTONE 25 MG PO TABS: 25.0000 mg | ORAL_TABLET | Freq: Every day | ORAL | 1 refills | Status: DC

## 2023-06-27 NOTE — Assessment & Plan Note (Signed)
Wt Readings from Last 3 Encounters:  06/27/23 293 lb (132.9 kg)  03/25/23 297 lb (134.7 kg)  02/25/23 295 lb 3.2 oz (133.9 kg)   Body mass index is 47.29 kg/m.  Patient counseled on low-carb modified diets She is on Trulicity 3 mg once weekly for type 2 diabetes Engage in regular moderate exercises at least 150 minutes weekly as tolerated

## 2023-06-27 NOTE — Assessment & Plan Note (Signed)
A1c 5.9 today Continue Trulicity 3 mg once weekly, glimepiride 4 mg daily CBG goals discussed patient encouraged to report hypoglycemia Patient counseled on low-carb modified diet, engage in regular moderate exercises at least 150 minutes weekly as tolerated Appreciate collaboration with the clinical pharmacist

## 2023-06-27 NOTE — Assessment & Plan Note (Signed)
Well-controlled on Crestor 40 mg daily, Zetia 10 mg daily Continue current medications Avoid fatty fried foods Lab Results  Component Value Date   CHOL 139 04/08/2023   HDL 47 04/08/2023   LDLCALC 64 04/08/2023   LDLDIRECT 62 04/08/2023   TRIG 163 (H) 04/08/2023   CHOLHDL 3.0 04/08/2023

## 2023-06-27 NOTE — Patient Instructions (Signed)
Goal for fasting blood sugar ranges from 80 to 120 and 2 hours after any meal or at bedtime should be between 130 to 170.     Primary hypertension  - Basic metabolic panel; Future - Basic metabolic panel - spironolactone (ALDACTONE) 25 MG tablet; Take 1 tablet (25 mg total) by mouth daily.  Dispense: 90 tablet; Refill: 1    Around 3 times per week, check your blood pressure 2 times per day. once in the morning and once in the evening. The readings should be at least one minute apart. Write down these values and bring them to your next nurse visit/appointment.  When you check your BP, make sure you have been doing something calm/relaxing 5 minutes prior to checking. Both feet should be flat on the floor and you should be sitting. Use your left arm and make sure it is in a relaxed position (on a table), and that the cuff is at the approximate level/height of your heart.  Blood pressure goal is less than 130/80   It is important that you exercise regularly at least 30 minutes 5 times a week as tolerated  Think about what you will eat, plan ahead. Choose " clean, green, fresh or frozen" over canned, processed or packaged foods which are more sugary, salty and fatty. 70 to 75% of food eaten should be vegetables and fruit. Three meals at set times with snacks allowed between meals, but they must be fruit or vegetables. Aim to eat over a 12 hour period , example 7 am to 7 pm, and STOP after  your last meal of the day. Drink water,generally about 64 ounces per day, no other drink is as healthy. Fruit juice is best enjoyed in a healthy way, by EATING the fruit.  Thanks for choosing [facility] we consider it a privelige to serve you.

## 2023-06-27 NOTE — Assessment & Plan Note (Signed)
BP Readings from Last 3 Encounters:  06/27/23 (!) 169/88  03/25/23 123/82  02/25/23 136/78   HTN unControlled .  Increase spironolactone to 25 mg daily, continue atenolol 100 mg daily DASH diet advised, engage in regular moderate exercises at least 150 minutes weekly as tolerated BMP today BMP in 2 weeks Blood pressure goal is less than 130/80 Follow-up in the office in 4 weeks

## 2023-06-27 NOTE — Progress Notes (Signed)
Established Patient Office Visit  Subjective:  Patient ID: Nancy Pham, female    DOB: [birth year]  Age: 57 y.o. MRN: [id number]  CC:  Chief Complaint  Patient presents with   Hypertension    Follow up   Diabetes    Follow up    HPI Nancy Pham is a 57 y.o. female  has a past medical history of Asthma, Cancer (HCC), Chronic pain of right knee, Diabetes mellitus, Gout, History of cervical cancer, History of ovarian cancer, Hyperlipidemia, Hyperlipidemia LDL goal <70, Hypertension, and Obesity.  Patient presents for follow-up for her chronic medical conditions  Type 2 diabetes.  Currently on Trulicity 3 mg once weekly, glimepiride 4 mg daily.  She denies hypoglycemia.  States that she has been trying to eat better .takes Zetia 10 mg and rosuvastatin 40 mg daily for hyperlipidemia  Hypertension.  Currently on spironolactone 12.5 mg daily, atenolol 100 mg daily.  She has stopped taking amlodipine due to leg edema.  Stated that her leg edema has resolved since she stopped taking amlodipine.  She denies chest pain shortness of breath dizziness  Patient encouraged to get her cervical cancer screening done, encouraged to get Tdap and shingles vaccine at the pharmacy..      Past Medical History:  Diagnosis Date   Asthma    Cancer (HCC)    Chronic pain of right knee    Diabetes mellitus    Gout    History of cervical cancer    History of ovarian cancer    Hyperlipidemia    Hyperlipidemia LDL goal <70    Hypertension    Obesity     Past Surgical History:  Procedure Laterality Date   ABDOMINAL HYSTERECTOMY     BREAST BIOPSY Left 02/23/2023   MM LT BREAST BX W LOC DEV 1ST LESION IMAGE BX SPEC STEREO GUIDE 02/23/2023 [facility]   BREAST BIOPSY Right 02/23/2023   MM RT BREAST BX W LOC DEV 1ST LESION IMAGE BX SPEC STEREO GUIDE 02/23/2023 [facility]    Family History  Problem Relation Age of Onset   Seizures Mother    Heart attack Father    Heart attack Sister     Hypertension Sister    Hypertension Brother    Hypertension Brother    Hypertension Brother    Hypertension Brother    Hypertension Sister     Social History   Socioeconomic History   Marital status: Single    Spouse name: Not on file   Number of children: Not on file   Years of education: Not on file   Highest education level: Not on file  Occupational History   Not on file  Tobacco Use   Smoking status: Never   Smokeless tobacco: Never  Vaping Use   Vaping status: Never Used  Substance and Sexual Activity   Alcohol use: No   Drug use: No   Sexual activity: Not Currently  Other Topics Concern   Not on file  Social History Narrative   Not on file   Social Determinants of Health   Financial Resource Strain: Not on file  Food Insecurity: Not on file  Transportation Needs: Not on file  Physical Activity: Not on file  Stress: Not on file  Social Connections: Not on file  Intimate Partner Violence: Not on file    Outpatient Medications Prior to Visit  Medication Sig Dispense Refill   acetaminophen (TYLENOL) 325 MG tablet Take 325-650 mg by mouth every 6 (six) hours as  needed for mild pain, fever or headache.      albuterol (VENTOLIN HFA) 108 (90 Base) MCG/ACT inhaler Inhale 1-2 puffs into the lungs every 6 (six) hours as needed for wheezing or shortness of breath. Please dispense 3 months supple. 8 g 11   allopurinol (ZYLOPRIM) 300 MG tablet Take 1 tablet (300 mg total) by mouth daily. 90 tablet 3   aspirin EC 81 MG tablet Take 1 tablet (81 mg total) by mouth daily. 90 tablet 1   atenolol (TENORMIN) 100 MG tablet Take 1 tablet (100 mg total) by mouth daily. 90 tablet 1   Blood Glucose Monitoring Suppl DEVI Use to check up to twice a day. May substitute to any manufacturer covered by patient's insurance. 1 each 0   cetirizine (ZYRTEC) 10 MG tablet Take 10 mg by mouth daily.     Cholecalciferol (VITAMIN D3) 125 MCG (5000 UT) CAPS Take 1 capsule (5,000 Units total) by  mouth daily. 15 capsule 1   Dulaglutide (TRULICITY) 3 MG/0.5ML SOPN Inject 3 mg as directed once a week. 2 mL 2   ezetimibe (ZETIA) 10 MG tablet Take 1 tablet (10 mg total) by mouth daily. 90 tablet 3   famotidine (PEPCID) 20 MG tablet Take 1 tablet (20 mg total) by mouth daily. 90 tablet 3   ferrous sulfate 325 (65 FE) MG tablet Take 1 tablet (325 mg total) by mouth daily with breakfast. 90 tablet 3   fluticasone (FLONASE) 50 MCG/ACT nasal spray Place 2 sprays into both nostrils daily as needed for allergies or rhinitis. 11.1 mL 11   gabapentin (NEURONTIN) 100 MG capsule Take 1 capsule (100 mg total) by mouth 3 (three) times daily. 270 capsule 3   glimepiride (AMARYL) 4 MG tablet Take 1 tablet (4 mg total) by mouth daily before breakfast. 60 tablet 1   meloxicam (MOBIC) 15 MG tablet TAKE 1 TABLET BY MOUTH EVERY DAY AS NEEDED FOR PAIN 30 tablet 0   Multiple Vitamins-Minerals (CENTRUM SILVER 50+WOMEN) TABS Take 1 tablet by mouth daily. 90 tablet 1   Omega-3 Fatty Acids (FISH OIL PO) Take 1 capsule by mouth daily.     Probiotic Product (PROBIOTIC DAILY PO) Take 1 capsule by mouth daily.     rosuvastatin (CRESTOR) 40 MG tablet Take 1 tablet (40 mg total) by mouth daily. 90 tablet 1   spironolactone (ALDACTONE) 25 MG tablet Take 0.5 tablets (12.5 mg total) by mouth daily. 90 tablet 1   Glucose Blood (BLOOD GLUCOSE TEST STRIPS) STRP Use to check up to twice a day. May substitute to any manufacturer covered by patient's insurance. (Patient not taking: Reported on 02/25/2023) 100 strip 3   hydrocortisone cream (HYDROCORTISONE ANTI-ITCH) 1 % Apply 1 application topically 2 (two) times daily. (Patient not taking: Reported on 12/14/2022) 30 g 3   Lancet Device MISC Use to check up to twice a day. May substitute to any manufacturer covered by patient's insurance. (Patient not taking: Reported on 03/25/2023) 1 each 0   Lancets Misc. MISC Use to check up to twice a day. May substitute to any manufacturer covered by  patient's insurance. (Patient not taking: Reported on 02/25/2023) 100 each 3   Facility-Administered Medications Prior to Visit  Medication Dose Route Frequency Provider Last Rate Last Admin   cloNIDine (CATAPRES) tablet 0.2 mg  0.2 mg Oral Once Barbette Merino, NP        Allergies  Allergen Reactions   Ace Inhibitors Swelling    Swelling of the lips  Tape Rash    No Band-Aids!!    ROS Review of Systems  Constitutional:  Negative for appetite change, chills, fatigue and fever.  HENT:  Negative for congestion, postnasal drip, rhinorrhea and sneezing.   Respiratory:  Negative for cough, shortness of breath and wheezing.   Cardiovascular:  Negative for chest pain, palpitations and leg swelling.  Gastrointestinal:  Negative for abdominal pain, constipation, nausea and vomiting.  Genitourinary:  Negative for difficulty urinating, dysuria, flank pain and frequency.  Musculoskeletal:  Negative for arthralgias, back pain, joint swelling and myalgias.  Skin:  Negative for color change, pallor, rash and wound.  Neurological:  Negative for dizziness, facial asymmetry, weakness, numbness and headaches.  Psychiatric/Behavioral:  Negative for behavioral problems, confusion, self-injury and suicidal ideas.       Objective:    Physical Exam Vitals and nursing note reviewed.  Constitutional:      General: She is not in acute distress.    Appearance: Normal appearance. She is obese. She is not ill-appearing, toxic-appearing or diaphoretic.  HENT:     Mouth/Throat:     Mouth: Mucous membranes are moist.     Pharynx: Oropharynx is clear. No oropharyngeal exudate or posterior oropharyngeal erythema.  Eyes:     General: No scleral icterus.       Right eye: No discharge.        Left eye: No discharge.     Extraocular Movements: Extraocular movements intact.     Conjunctiva/sclera: Conjunctivae normal.  Cardiovascular:     Rate and Rhythm: Normal rate and regular rhythm.     Pulses: Normal  pulses.     Heart sounds: Normal heart sounds. No murmur heard.    No friction rub. No gallop.  Pulmonary:     Effort: Pulmonary effort is normal. No respiratory distress.     Breath sounds: Normal breath sounds. No stridor. No wheezing, rhonchi or rales.  Chest:     Chest wall: No tenderness.  Abdominal:     General: There is no distension.     Palpations: Abdomen is soft.     Tenderness: There is no abdominal tenderness. There is no right CVA tenderness, left CVA tenderness or guarding.  Musculoskeletal:        General: No swelling, tenderness, deformity or signs of injury.     Right lower leg: No edema.     Left lower leg: No edema.  Skin:    General: Skin is warm and dry.     Capillary Refill: Capillary refill takes less than 2 seconds.     Coloration: Skin is not jaundiced or pale.     Findings: No bruising, erythema or lesion.  Neurological:     Mental Status: She is alert and oriented to person, place, and time.     Motor: No weakness.     Coordination: Coordination normal.     Gait: Gait normal.  Psychiatric:        Mood and Affect: Mood normal.        Behavior: Behavior normal.        Thought Content: Thought content normal.        Judgment: Judgment normal.     BP (!) 169/88   Pulse 65   Temp 97.7 F (36.5 C)   Wt 293 lb (132.9 kg)   SpO2 100%   BMI 47.29 kg/m  Wt Readings from Last 3 Encounters:  06/27/23 293 lb (132.9 kg)  03/25/23 297 lb (134.7 kg)  02/25/23 295 lb  3.2 oz (133.9 kg)    Lab Results  Component Value Date   TSH 1.680 12/14/2022   Lab Results  Component Value Date   WBC 7.5 01/11/2023   HGB 13.4 01/11/2023   HCT 41.3 01/11/2023   MCV 86.9 01/11/2023   PLT 327 01/11/2023   Lab Results  Component Value Date   NA 139 03/11/2023   K 4.0 03/11/2023   CO2 24 03/11/2023   GLUCOSE 128 (H) 03/11/2023   BUN 12 03/11/2023   CREATININE 0.65 03/11/2023   BILITOT 1.0 01/08/2023   ALKPHOS 102 01/08/2023   AST 43 (H) 01/08/2023   ALT 32  01/08/2023   PROT 8.5 (H) 01/08/2023   ALBUMIN 3.2 (L) 01/08/2023   CALCIUM 9.4 03/11/2023   ANIONGAP 13 01/11/2023   EGFR 102 03/11/2023   Lab Results  Component Value Date   CHOL 139 04/08/2023   Lab Results  Component Value Date   HDL 47 04/08/2023   Lab Results  Component Value Date   LDLCALC 64 04/08/2023   Lab Results  Component Value Date   TRIG 163 (H) 04/08/2023   Lab Results  Component Value Date   CHOLHDL 3.0 04/08/2023   Lab Results  Component Value Date   HGBA1C 5.9 (A) 06/27/2023      Assessment & Plan:   Problem List Items Addressed This Visit       Cardiovascular and Mediastinum   Hypertension    BP Readings from Last 3 Encounters:  06/27/23 (!) 169/88  03/25/23 123/82  02/25/23 136/78   HTN unControlled .  Increase spironolactone to 25 mg daily, continue atenolol 100 mg daily DASH diet advised, engage in regular moderate exercises at least 150 minutes weekly as tolerated BMP today BMP in 2 weeks Blood pressure goal is less than 130/80 Follow-up in the office in 4 weeks      Relevant Medications   spironolactone (ALDACTONE) 25 MG tablet   Other Relevant Orders   Basic metabolic panel   Basic metabolic panel     Endocrine   Uncontrolled type 2 diabetes mellitus with hyperglycemia (HCC) - Primary    A1c 5.9 today Continue Trulicity 3 mg once weekly, glimepiride 4 mg daily CBG goals discussed patient encouraged to report hypoglycemia Patient counseled on low-carb modified diet, engage in regular moderate exercises at least 150 minutes weekly as tolerated Appreciate collaboration with the clinical pharmacist      Relevant Medications   spironolactone (ALDACTONE) 25 MG tablet   Other Relevant Orders   POCT glycosylated hemoglobin (Hb A1C) (Completed)     Other   Dyslipidemia, goal LDL below 70    Well-controlled on Crestor 40 mg daily, Zetia 10 mg daily Continue current medications Avoid fatty fried foods Lab Results  Component  Value Date   CHOL 139 04/08/2023   HDL 47 04/08/2023   LDLCALC 64 04/08/2023   LDLDIRECT 62 04/08/2023   TRIG 163 (H) 04/08/2023   CHOLHDL 3.0 04/08/2023         Relevant Medications   spironolactone (ALDACTONE) 25 MG tablet   Class 3 severe obesity with serious comorbidity and body mass index (BMI) of 45.0 to 49.9 in adult (HCC)    Wt Readings from Last 3 Encounters:  06/27/23 293 lb (132.9 kg)  03/25/23 297 lb (134.7 kg)  02/25/23 295 lb 3.2 oz (133.9 kg)   Body mass index is 47.29 kg/m.  Patient counseled on low-carb modified diets She is on Trulicity 3 mg once weekly for  type 2 diabetes Engage in regular moderate exercises at least 150 minutes weekly as tolerated       Meds ordered this encounter  Medications   spironolactone (ALDACTONE) 25 MG tablet    Sig: Take 1 tablet (25 mg total) by mouth daily.    Dispense:  90 tablet    Refill:  1    Follow-up: Return in about 4 weeks (around 07/25/2023) for Labs in 2 weeks, HTN.    Donell Beers, FNP

## 2023-06-28 ENCOUNTER — Other Ambulatory Visit (HOSPITAL_COMMUNITY): Payer: Self-pay | Admitting: Nurse Practitioner

## 2023-06-28 DIAGNOSIS — E1165 Type 2 diabetes mellitus with hyperglycemia: Secondary | ICD-10-CM

## 2023-06-28 DIAGNOSIS — I10 Essential (primary) hypertension: Secondary | ICD-10-CM

## 2023-06-28 LAB — BASIC METABOLIC PANEL
BUN/Creatinine Ratio: 17 (ref 9–23)
BUN: 18 mg/dL (ref 6–24)
CO2: 22 mmol/L (ref 20–29)
Calcium: 8.8 mg/dL (ref 8.7–10.2)
Chloride: 105 mmol/L (ref 96–106)
Creatinine, Ser: 1.06 mg/dL — ABNORMAL HIGH (ref 0.57–1.00)
Glucose: 105 mg/dL — ABNORMAL HIGH (ref 70–99)
Potassium: 3.8 mmol/L (ref 3.5–5.2)
Sodium: 142 mmol/L (ref 134–144)
eGFR: 61 mL/min/{1.73_m2} (ref >59)

## 2023-06-28 MED ORDER — HYDROCHLOROTHIAZIDE 12.5 MG PO TABS: 12.5000 mg | ORAL_TABLET | Freq: Every day | ORAL | 0 refills | Status: DC

## 2023-06-28 MED ORDER — SPIRONOLACTONE 25 MG PO TABS: 12.5000 mg | ORAL_TABLET | Freq: Every day | ORAL | 1 refills | Status: DC

## 2023-06-30 ENCOUNTER — Other Ambulatory Visit: Payer: Self-pay | Admitting: Nurse Practitioner

## 2023-06-30 DIAGNOSIS — G8929 Other chronic pain: Secondary | ICD-10-CM

## 2023-06-30 NOTE — Telephone Encounter (Signed)
Please advise K[facility] 

## 2023-07-11 ENCOUNTER — Other Ambulatory Visit: Payer: Self-pay

## 2023-07-12 ENCOUNTER — Other Ambulatory Visit: Payer: 59

## 2023-07-12 DIAGNOSIS — I10 Essential (primary) hypertension: Secondary | ICD-10-CM

## 2023-07-13 LAB — BASIC METABOLIC PANEL
BUN/Creatinine Ratio: 25 — ABNORMAL HIGH (ref 9–23)
BUN: 18 mg/dL (ref 6–24)
CO2: 23 mmol/L (ref 20–29)
Calcium: 9.8 mg/dL (ref 8.7–10.2)
Chloride: 102 mmol/L (ref 96–106)
Creatinine, Ser: 0.71 mg/dL (ref 0.57–1.00)
Glucose: 85 mg/dL (ref 70–99)
Potassium: 4.1 mmol/L (ref 3.5–5.2)
Sodium: 143 mmol/L (ref 134–144)
eGFR: 98 mL/min/{1.73_m2} (ref >59)

## 2023-07-21 ENCOUNTER — Other Ambulatory Visit: Payer: Self-pay | Admitting: Nurse Practitioner

## 2023-07-21 DIAGNOSIS — I10 Essential (primary) hypertension: Secondary | ICD-10-CM

## 2023-07-25 ENCOUNTER — Ambulatory Visit: Payer: Self-pay | Admitting: Nurse Practitioner

## 2023-08-01 ENCOUNTER — Other Ambulatory Visit: Payer: Self-pay | Admitting: Nurse Practitioner

## 2023-08-01 DIAGNOSIS — G8929 Other chronic pain: Secondary | ICD-10-CM

## 2023-08-04 ENCOUNTER — Other Ambulatory Visit: Payer: Self-pay

## 2023-08-04 ENCOUNTER — Telehealth: Payer: Self-pay | Admitting: Pharmacist

## 2023-08-04 NOTE — Progress Notes (Signed)
Attempted to contact patient for scheduled appointment for medication management. Left HIPAA compliant message for patient to return my call at their convenience.   Will send MyChart.  Catie T. Harper, PharmD, BCACP, CPP Clinical Pharmacist [facility] [phone]  

## 2023-08-05 ENCOUNTER — Other Ambulatory Visit: Payer: Self-pay | Admitting: Nurse Practitioner

## 2023-08-05 DIAGNOSIS — G8929 Other chronic pain: Secondary | ICD-10-CM

## 2023-08-15 ENCOUNTER — Ambulatory Visit: Payer: 59 | Admitting: Podiatry

## 2023-08-17 ENCOUNTER — Other Ambulatory Visit: Payer: Self-pay | Admitting: Nurse Practitioner

## 2023-08-17 ENCOUNTER — Ambulatory Visit: Payer: Self-pay | Admitting: Nurse Practitioner

## 2023-08-17 NOTE — Telephone Encounter (Unsigned)
Copied from CRM [id number]. Topic: Clinical - Medication Refill >> Aug 17, 2023 12:31 PM Marlow Baars wrote: Most Recent Primary Care Visit:  Provider: [facility]  Department: [facility]  Visit Type: LAB  Date: 07/12/2023  Medication: meloxicam (MOBIC) 15 MG tablet  Has the patient contacted their pharmacy? Yes   Is this the correct pharmacy for this prescription? Yes If no, delete pharmacy and type the correct one.  This is the patient's preferred pharmacy:  [facility] [address] [place] - [address] [name] [address] Phone: [phone] Fax: [phone]   Has the prescription been filled recently? Yes   Is the patient out of the medication? Yes she says she has been out for a while  Has the patient been seen for an appointment in the last year OR does the patient have an upcoming appointment? Yes  Can we respond through MyChart? Yes  The patient is not sure why the pharmacy would not give her the prescription that was called in on 1/10. She states she never picked it up and was told to call her provider as they would not give it to her.

## 2023-08-29 ENCOUNTER — Other Ambulatory Visit: Payer: Self-pay | Admitting: Nurse Practitioner

## 2023-09-09 ENCOUNTER — Encounter: Payer: Self-pay | Admitting: Nurse Practitioner

## 2023-09-09 ENCOUNTER — Ambulatory Visit (INDEPENDENT_AMBULATORY_CARE_PROVIDER_SITE_OTHER): Payer: 59 | Admitting: Nurse Practitioner

## 2023-09-09 VITALS — BP 126/72 | HR 67 | Temp 97.5°F | Wt 299.0 lb

## 2023-09-09 DIAGNOSIS — Z6841 Body Mass Index (BMI) 40.0 and over, adult: Secondary | ICD-10-CM

## 2023-09-09 DIAGNOSIS — E1165 Type 2 diabetes mellitus with hyperglycemia: Secondary | ICD-10-CM

## 2023-09-09 DIAGNOSIS — M5442 Lumbago with sciatica, left side: Secondary | ICD-10-CM | POA: Diagnosis not present

## 2023-09-09 DIAGNOSIS — M5441 Lumbago with sciatica, right side: Secondary | ICD-10-CM

## 2023-09-09 DIAGNOSIS — E66813 Obesity, class 3: Secondary | ICD-10-CM

## 2023-09-09 DIAGNOSIS — E785 Hyperlipidemia, unspecified: Secondary | ICD-10-CM

## 2023-09-09 DIAGNOSIS — I10 Essential (primary) hypertension: Secondary | ICD-10-CM | POA: Diagnosis not present

## 2023-09-09 DIAGNOSIS — G8929 Other chronic pain: Secondary | ICD-10-CM | POA: Insufficient documentation

## 2023-09-09 MED ORDER — GLIMEPIRIDE 4 MG PO TABS: 4.0000 mg | ORAL_TABLET | Freq: Every day | ORAL | 0 refills | Status: DC

## 2023-09-09 MED ORDER — GABAPENTIN 100 MG PO CAPS: 100.0000 mg | ORAL_CAPSULE | Freq: Three times a day (TID) | ORAL | 3 refills | Status: DC

## 2023-09-09 MED ORDER — ATENOLOL 100 MG PO TABS: 100.0000 mg | ORAL_TABLET | Freq: Every day | ORAL | 1 refills | Status: DC

## 2023-09-09 MED ORDER — GLIMEPIRIDE 4 MG PO TABS: 4.0000 mg | ORAL_TABLET | Freq: Two times a day (BID) | ORAL | 1 refills | Status: DC

## 2023-09-09 NOTE — Patient Instructions (Signed)
Please consider getting Shingrix, pneumococcal  and Tdap vaccine at local pharmacy.    It is important that you exercise regularly at least 30 minutes 5 times a week as tolerated  Think about what you will eat, plan ahead. Choose " clean, green, fresh or frozen" over canned, processed or packaged foods which are more sugary, salty and fatty. 70 to 75% of food eaten should be vegetables and fruit. Three meals at set times with snacks allowed between meals, but they must be fruit or vegetables. Aim to eat over a 12 hour period , example 7 am to 7 pm, and STOP after  your last meal of the day. Drink water,generally about 64 ounces per day, no other drink is as healthy. Fruit juice is best enjoyed in a healthy way, by EATING the fruit.  Thanks for choosing [facility] we consider it a privelige to serve you.

## 2023-09-09 NOTE — Assessment & Plan Note (Addendum)
Chronic medical condition currently well-controlled on  atenolol 100 mg daily, hydrochlorothiazide 12.5 mg daily, spironolactone 12.5 mg daily. Continue current medications DASH diet and commitment to daily physical activity for a minimum of 30 minutes discussed and encouraged, as a part of hypertension management. The importance of attaining a healthy weight is also discussed.     09/09/2023    2:25 PM 09/09/2023    2:12 PM 06/27/2023    2:55 PM 06/27/2023    2:41 PM 06/27/2023    2:28 PM 03/25/2023    1:18 PM 02/25/2023   10:56 AM  BP/Weight  Systolic BP 126 142 169 166 176 123 136  Diastolic BP 72 67 88 93 89 82 78  Wt. (Lbs)  299   293 297 295.2  BMI  48.26 kg/m2   47.29 kg/m2 47.94 kg/m2 47.65 kg/m2

## 2023-09-09 NOTE — Assessment & Plan Note (Addendum)
Lab Results  Component Value Date   HGBA1C 5.9 (A) 06/27/2023    Trulicity was last taken last week, cannot refill medication due to lack of insurance, in the meantime will increase glimepiride to 4 mg twice daily Patient counseled on low-carb diet Patient counseled on low-carb modified diet, engage in regular moderate exercises at least 150 minutes weekly as tolerated Check A1c at next visit Will send a message to the clinical pharmacist to see if the patient will qualify for patient assistance for Trulicity

## 2023-09-09 NOTE — Assessment & Plan Note (Signed)
Well controlled on gabapentin and meloxicam 15 daily PRN, tylenol prn  Continue current meds  - gabapentin (NEURONTIN) 100 MG capsule; Take 1 capsule (100 mg total) by mouth 3 (three) times daily.  Dispense: 270 capsule; Refill: 3

## 2023-09-09 NOTE — Assessment & Plan Note (Addendum)
Wt Readings from Last 3 Encounters:  09/09/23 299 lb (135.6 kg)  06/27/23 293 lb (132.9 kg)  03/25/23 297 lb (134.7 kg)   Body mass index is 48.26 kg/m.   Patient counseled on low-carb diet Encouraged engage in regular moderate exercise at least  150 minutes weekly as tolerated

## 2023-09-09 NOTE — Assessment & Plan Note (Signed)
Currently well-controlled Continue Zetia 10 mg daily, Crestor 40 mg daily  Lab Results  Component Value Date   CHOL 139 04/08/2023   HDL 47 04/08/2023   LDLCALC 64 04/08/2023   LDLDIRECT 62 04/08/2023   TRIG 163 (H) 04/08/2023   CHOLHDL 3.0 04/08/2023

## 2023-09-09 NOTE — Progress Notes (Signed)
Established Patient Office Visit  Subjective:  Patient ID: Nancy Pham, female    DOB: 1966-06-27  Age: 58 y.o. MRN: [id number]  CC:  Chief Complaint  Patient presents with   Diabetes   Hypertension    HPI Nancy Pham is a 58 y.o. female  has a past medical history of Asthma, Cancer (HCC), Chronic pain of right knee, Diabetes mellitus, Gout, History of cervical cancer, History of ovarian cancer, Hyperlipidemia, Hyperlipidemia LDL goal <70, Hypertension, and Obesity.  Patient presents for follow-up for hypertension. Patient denies any adverse reactions to current medications  Hypertension.  Currently on atenolol 100 mg  daily, hydrochlorothiazide 12.5 mg daily, spironolactone 12.5 mg daily.  Patient denies chest pain, shortness of breath, increased edema  Type 2 diabetes.  Currently on Trulicity 3 mg once weekly injection, glimepiride 4 mg daily, takes Zetia 10 mg daily and rosuvastatin 40 mg daily for hyperlipidemia.  Patient stated that her current insurance is not at the and she cannot afford cost of Trulicity , she would like to apply for Medicaid .patient denies hypoglycemia.   Patient encouraged to get pneumococcal vaccine Tdap vaccine and shingles vaccine at the pharmacy.  Past Medical History:  Diagnosis Date   Asthma    Cancer (HCC)    Chronic pain of right knee    Diabetes mellitus    Gout    History of cervical cancer    History of ovarian cancer    Hyperlipidemia    Hyperlipidemia LDL goal <70    Hypertension    Obesity     Past Surgical History:  Procedure Laterality Date   ABDOMINAL HYSTERECTOMY     BREAST BIOPSY Left 02/23/2023   MM LT BREAST BX W LOC DEV 1ST LESION IMAGE BX SPEC STEREO GUIDE 02/23/2023 [facility]   BREAST BIOPSY Right 02/23/2023   MM RT BREAST BX W LOC DEV 1ST LESION IMAGE BX SPEC STEREO GUIDE 02/23/2023 [facility]    Family History  Problem Relation Age of Onset   Seizures Mother    Heart attack Father    Heart  attack Sister    Hypertension Sister    Hypertension Brother    Hypertension Brother    Hypertension Brother    Hypertension Brother    Hypertension Sister     Social History   Socioeconomic History   Marital status: Single    Spouse name: Not on file   Number of children: Not on file   Years of education: Not on file   Highest education level: Not on file  Occupational History   Not on file  Tobacco Use   Smoking status: Never   Smokeless tobacco: Never  Vaping Use   Vaping status: Never Used  Substance and Sexual Activity   Alcohol use: No   Drug use: No   Sexual activity: Not Currently  Other Topics Concern   Not on file  Social History Narrative   Not on file   Social Drivers of Health   Financial Resource Strain: Not on file  Food Insecurity: Not on file  Transportation Needs: Not on file  Physical Activity: Not on file  Stress: Not on file  Social Connections: Not on file  Intimate Partner Violence: Not on file    Outpatient Medications Prior to Visit  Medication Sig Dispense Refill   acetaminophen (TYLENOL) 325 MG tablet Take 325-650 mg by mouth every 6 (six) hours as needed for mild pain, fever or headache.      albuterol (  VENTOLIN HFA) 108 (90 Base) MCG/ACT inhaler Inhale 1-2 puffs into the lungs every 6 (six) hours as needed for wheezing or shortness of breath. Please dispense 3 months supple. 8 g 11   allopurinol (ZYLOPRIM) 300 MG tablet Take 1 tablet (300 mg total) by mouth daily. 90 tablet 3   aspirin EC 81 MG tablet Take 1 tablet (81 mg total) by mouth daily. 90 tablet 1   Blood Glucose Monitoring Suppl DEVI Use to check up to twice a day. May substitute to any manufacturer covered by patient's insurance. 1 each 0   cetirizine (ZYRTEC) 10 MG tablet Take 10 mg by mouth daily.     Cholecalciferol (VITAMIN D3) 125 MCG (5000 UT) CAPS Take 1 capsule (5,000 Units total) by mouth daily. 15 capsule 1   Dulaglutide (TRULICITY) 3 MG/0.5ML SOAJ INJECT 3 MG AS  DIRECTED ONCE A WEEK. 2 mL 2   ezetimibe (ZETIA) 10 MG tablet Take 1 tablet (10 mg total) by mouth daily. 90 tablet 3   famotidine (PEPCID) 20 MG tablet Take 1 tablet (20 mg total) by mouth daily. 90 tablet 3   ferrous sulfate 325 (65 FE) MG tablet Take 1 tablet (325 mg total) by mouth daily with breakfast. 90 tablet 3   fluticasone (FLONASE) 50 MCG/ACT nasal spray Place 2 sprays into both nostrils daily as needed for allergies or rhinitis. 11.1 mL 11   hydrochlorothiazide (HYDRODIURIL) 12.5 MG tablet TAKE 1 TABLET BY MOUTH EVERY DAY 90 tablet 1   meloxicam (MOBIC) 15 MG tablet TAKE 1 TABLET BY MOUTH EVERY DAY AS NEEDED FOR PAIN 30 tablet 0   Multiple Vitamins-Minerals (CENTRUM SILVER 50+WOMEN) TABS Take 1 tablet by mouth daily. 90 tablet 1   Omega-3 Fatty Acids (FISH OIL PO) Take 1 capsule by mouth daily.     Probiotic Product (PROBIOTIC DAILY PO) Take 1 capsule by mouth daily.     rosuvastatin (CRESTOR) 40 MG tablet Take 1 tablet (40 mg total) by mouth daily. 90 tablet 1   spironolactone (ALDACTONE) 25 MG tablet Take 0.5 tablets (12.5 mg total) by mouth daily. 90 tablet 1   atenolol (TENORMIN) 100 MG tablet Take 1 tablet (100 mg total) by mouth daily. 90 tablet 1   gabapentin (NEURONTIN) 100 MG capsule Take 1 capsule (100 mg total) by mouth 3 (three) times daily. 270 capsule 3   glimepiride (AMARYL) 4 MG tablet Take 1 tablet (4 mg total) by mouth daily before breakfast. 60 tablet 1   Glucose Blood (BLOOD GLUCOSE TEST STRIPS) STRP Use to check up to twice a day. May substitute to any manufacturer covered by patient's insurance. (Patient not taking: Reported on 09/09/2023) 100 strip 3   hydrocortisone cream (HYDROCORTISONE ANTI-ITCH) 1 % Apply 1 application topically 2 (two) times daily. (Patient not taking: Reported on 12/14/2022) 30 g 3   Lancet Device MISC Use to check up to twice a day. May substitute to any manufacturer covered by patient's insurance. (Patient not taking: Reported on 09/09/2023) 1  each 0   Lancets Misc. MISC Use to check up to twice a day. May substitute to any manufacturer covered by patient's insurance. (Patient not taking: Reported on 09/09/2023) 100 each 3   cloNIDine (CATAPRES) tablet 0.2 mg      No facility-administered medications prior to visit.    Allergies  Allergen Reactions   Ace Inhibitors Swelling    Swelling of the lips   Tape Rash    No Band-Aids!!    ROS Review of  Systems  Constitutional:  Negative for appetite change, chills, fatigue and fever.  HENT:  Negative for congestion, postnasal drip, rhinorrhea and sneezing.   Respiratory:  Negative for cough, shortness of breath and wheezing.   Cardiovascular:  Negative for chest pain, palpitations and leg swelling.  Gastrointestinal:  Negative for abdominal pain, constipation, nausea and vomiting.  Genitourinary:  Negative for difficulty urinating, dysuria, flank pain and frequency.  Musculoskeletal:  Negative for arthralgias, back pain, joint swelling and myalgias.  Skin:  Negative for color change, pallor, rash and wound.  Neurological:  Negative for dizziness, facial asymmetry, weakness, numbness and headaches.  Psychiatric/Behavioral:  Negative for behavioral problems, confusion, self-injury and suicidal ideas.       Objective:    Physical Exam Vitals and nursing note reviewed.  Constitutional:      General: She is not in acute distress.    Appearance: Normal appearance. She is obese. She is not ill-appearing, toxic-appearing or diaphoretic.  HENT:     Mouth/Throat:     Mouth: Mucous membranes are moist.     Pharynx: Oropharynx is clear. No oropharyngeal exudate or posterior oropharyngeal erythema.  Eyes:     General: No scleral icterus.       Right eye: No discharge.        Left eye: No discharge.     Extraocular Movements: Extraocular movements intact.     Conjunctiva/sclera: Conjunctivae normal.  Cardiovascular:     Rate and Rhythm: Normal rate and regular rhythm.     Pulses:  Normal pulses.     Heart sounds: Normal heart sounds. No murmur heard.    No friction rub. No gallop.  Pulmonary:     Effort: Pulmonary effort is normal. No respiratory distress.     Breath sounds: Normal breath sounds. No stridor. No wheezing, rhonchi or rales.  Chest:     Chest wall: No tenderness.  Abdominal:     General: There is no distension.     Palpations: Abdomen is soft.     Tenderness: There is no abdominal tenderness. There is no right CVA tenderness, left CVA tenderness or guarding.  Musculoskeletal:        General: No swelling, tenderness, deformity or signs of injury.     Right lower leg: No edema.     Left lower leg: No edema.  Skin:    General: Skin is warm and dry.     Capillary Refill: Capillary refill takes less than 2 seconds.     Coloration: Skin is not jaundiced or pale.     Findings: No bruising, erythema or lesion.  Neurological:     Mental Status: She is alert and oriented to person, place, and time.     Motor: No weakness.     Coordination: Coordination normal.     Gait: Gait normal.  Psychiatric:        Mood and Affect: Mood normal.        Behavior: Behavior normal.        Thought Content: Thought content normal.        Judgment: Judgment normal.     BP 126/72   Pulse 67   Temp (!) 97.5 F (36.4 C)   Wt 299 lb (135.6 kg)   SpO2 100%   BMI 48.26 kg/m  Wt Readings from Last 3 Encounters:  09/09/23 299 lb (135.6 kg)  06/27/23 293 lb (132.9 kg)  03/25/23 297 lb (134.7 kg)    Lab Results  Component Value Date  TSH 1.680 12/14/2022   Lab Results  Component Value Date   WBC 7.5 01/11/2023   HGB 13.4 01/11/2023   HCT 41.3 01/11/2023   MCV 86.9 01/11/2023   PLT 327 01/11/2023   Lab Results  Component Value Date   NA 143 07/12/2023   K 4.1 07/12/2023   CO2 23 07/12/2023   GLUCOSE 85 07/12/2023   BUN 18 07/12/2023   CREATININE 0.71 07/12/2023   BILITOT 1.0 01/08/2023   ALKPHOS 102 01/08/2023   AST 43 (H) 01/08/2023   ALT 32  01/08/2023   PROT 8.5 (H) 01/08/2023   ALBUMIN 3.2 (L) 01/08/2023   CALCIUM 9.8 07/12/2023   ANIONGAP 13 01/11/2023   EGFR 98 07/12/2023   Lab Results  Component Value Date   CHOL 139 04/08/2023   Lab Results  Component Value Date   HDL 47 04/08/2023   Lab Results  Component Value Date   LDLCALC 64 04/08/2023   Lab Results  Component Value Date   TRIG 163 (H) 04/08/2023   Lab Results  Component Value Date   CHOLHDL 3.0 04/08/2023   Lab Results  Component Value Date   HGBA1C 5.9 (A) 06/27/2023      Assessment & Plan:   Problem List Items Addressed This Visit       Cardiovascular and Mediastinum   Hypertension   Chronic medical condition currently well-controlled on  atenolol 100 mg daily, hydrochlorothiazide 12.5 mg daily, spironolactone 12.5 mg daily. Continue current medications DASH diet and commitment to daily physical activity for a minimum of 30 minutes discussed and encouraged, as a part of hypertension management. The importance of attaining a healthy weight is also discussed.     09/09/2023    2:25 PM 09/09/2023    2:12 PM 06/27/2023    2:55 PM 06/27/2023    2:41 PM 06/27/2023    2:28 PM 03/25/2023    1:18 PM 02/25/2023   10:56 AM  BP/Weight  Systolic BP 126 142 169 166 176 123 136  Diastolic BP 72 67 88 93 89 82 78  Wt. (Lbs)  299   293 297 295.2  BMI  48.26 kg/m2   47.29 kg/m2 47.94 kg/m2 47.65 kg/m2           Relevant Medications   atenolol (TENORMIN) 100 MG tablet     Endocrine   Controlled type 2 diabetes mellitus with hyperglycemia, without long-term current use of insulin (HCC) - Primary   Lab Results  Component Value Date   HGBA1C 5.9 (A) 06/27/2023    Trulicity was last taken last week, cannot refill medication due to lack of insurance, in the meantime will increase glimepiride to 4 mg twice daily Patient counseled on low-carb diet Patient counseled on low-carb modified diet, engage in regular moderate exercises at least 150 minutes  weekly as tolerated Check A1c at next visit Will send a message to the clinical pharmacist to see if the patient will qualify for patient assistance for Trulicity      Relevant Medications   glimepiride (AMARYL) 4 MG tablet   Other Relevant Orders   Ambulatory referral to Ophthalmology     Nervous and Auditory   Chronic bilateral low back pain with bilateral sciatica   Well controlled on gabapentin and meloxicam 15 daily PRN, tylenol prn  Continue current meds  - gabapentin (NEURONTIN) 100 MG capsule; Take 1 capsule (100 mg total) by mouth 3 (three) times daily.  Dispense: 270 capsule; Refill: 3  Relevant Medications   gabapentin (NEURONTIN) 100 MG capsule     Other   Dyslipidemia, goal LDL below 70   Currently well-controlled Continue Zetia 10 mg daily, Crestor 40 mg daily  Lab Results  Component Value Date   CHOL 139 04/08/2023   HDL 47 04/08/2023   LDLCALC 64 04/08/2023   LDLDIRECT 62 04/08/2023   TRIG 163 (H) 04/08/2023   CHOLHDL 3.0 04/08/2023         Relevant Medications   atenolol (TENORMIN) 100 MG tablet   Class 3 severe obesity with serious comorbidity and body mass index (BMI) of 45.0 to 49.9 in adult (HCC)   Wt Readings from Last 3 Encounters:  09/09/23 299 lb (135.6 kg)  06/27/23 293 lb (132.9 kg)  03/25/23 297 lb (134.7 kg)   Body mass index is 48.26 kg/m.   Patient counseled on low-carb diet Encouraged engage in regular moderate exercise at least  150 minutes weekly as tolerated      Relevant Medications   glimepiride (AMARYL) 4 MG tablet    Meds ordered this encounter  Medications   atenolol (TENORMIN) 100 MG tablet    Sig: Take 1 tablet (100 mg total) by mouth daily.    Dispense:  90 tablet    Refill:  1   gabapentin (NEURONTIN) 100 MG capsule    Sig: Take 1 capsule (100 mg total) by mouth 3 (three) times daily.    Dispense:  270 capsule    Refill:  3   DISCONTD: glimepiride (AMARYL) 4 MG tablet    Sig: Take 1 tablet (4 mg  total) by mouth daily before breakfast.    Dispense:  90 tablet    Refill:  0   glimepiride (AMARYL) 4 MG tablet    Sig: Take 1 tablet (4 mg total) by mouth 2 (two) times daily.    Dispense:  120 tablet    Refill:  1    Follow-up: Return in about 4 months (around 01/07/2024) for CPE.    Donell Beers, FNP

## 2023-09-13 ENCOUNTER — Telehealth: Payer: Self-pay

## 2023-09-13 NOTE — Progress Notes (Signed)
Attempted to outreach patient to discuss options for Trulicity medication assistance. Was unable to reach patient - LVM with instructions to return the call. Will continue to follow.   Nils Pyle, PharmD PGY1 Pharmacy Resident

## 2023-09-30 ENCOUNTER — Other Ambulatory Visit: Payer: Self-pay | Admitting: Nurse Practitioner

## 2023-09-30 ENCOUNTER — Other Ambulatory Visit: Payer: Self-pay

## 2023-09-30 DIAGNOSIS — G8929 Other chronic pain: Secondary | ICD-10-CM

## 2023-09-30 MED ORDER — MELOXICAM 15 MG PO TABS: 15.0000 mg | ORAL_TABLET | Freq: Every day | ORAL | 0 refills | Status: DC | PRN

## 2023-09-30 NOTE — Telephone Encounter (Signed)
 Please advise [facility]

## 2024-01-10 ENCOUNTER — Encounter: Payer: Self-pay | Admitting: Nurse Practitioner

## 2024-01-10 ENCOUNTER — Ambulatory Visit (INDEPENDENT_AMBULATORY_CARE_PROVIDER_SITE_OTHER): Payer: Self-pay | Admitting: Nurse Practitioner

## 2024-01-10 VITALS — BP 151/84 | HR 65 | Temp 98.5°F | Wt 296.4 lb

## 2024-01-10 DIAGNOSIS — R21 Rash and other nonspecific skin eruption: Secondary | ICD-10-CM | POA: Diagnosis not present

## 2024-01-10 DIAGNOSIS — E785 Hyperlipidemia, unspecified: Secondary | ICD-10-CM | POA: Diagnosis not present

## 2024-01-10 DIAGNOSIS — M1A09X0 Idiopathic chronic gout, multiple sites, without tophus (tophi): Secondary | ICD-10-CM | POA: Diagnosis not present

## 2024-01-10 DIAGNOSIS — E559 Vitamin D deficiency, unspecified: Secondary | ICD-10-CM

## 2024-01-10 DIAGNOSIS — M25561 Pain in right knee: Secondary | ICD-10-CM

## 2024-01-10 DIAGNOSIS — I10 Essential (primary) hypertension: Secondary | ICD-10-CM

## 2024-01-10 DIAGNOSIS — M5442 Lumbago with sciatica, left side: Secondary | ICD-10-CM

## 2024-01-10 DIAGNOSIS — G8929 Other chronic pain: Secondary | ICD-10-CM

## 2024-01-10 DIAGNOSIS — M5441 Lumbago with sciatica, right side: Secondary | ICD-10-CM

## 2024-01-10 DIAGNOSIS — E1165 Type 2 diabetes mellitus with hyperglycemia: Secondary | ICD-10-CM

## 2024-01-10 LAB — POCT GLYCOSYLATED HEMOGLOBIN (HGB A1C): Hemoglobin A1C: 12.8 % — AB (ref 4.0–5.6)

## 2024-01-10 MED ORDER — TRIAMCINOLONE ACETONIDE 0.1 % EX CREA: 1.0000 | TOPICAL_CREAM | Freq: Two times a day (BID) | CUTANEOUS | 0 refills | Status: AC

## 2024-01-10 MED ORDER — ROSUVASTATIN CALCIUM 40 MG PO TABS: 40.0000 mg | ORAL_TABLET | Freq: Every day | ORAL | 1 refills | Status: DC

## 2024-01-10 MED ORDER — ATENOLOL 100 MG PO TABS: 100.0000 mg | ORAL_TABLET | Freq: Every day | ORAL | 1 refills | Status: DC

## 2024-01-10 MED ORDER — EZETIMIBE 10 MG PO TABS: 10.0000 mg | ORAL_TABLET | Freq: Every day | ORAL | 3 refills | Status: AC

## 2024-01-10 MED ORDER — SPIRONOLACTONE 25 MG PO TABS: 12.5000 mg | ORAL_TABLET | Freq: Every day | ORAL | 1 refills | Status: DC

## 2024-01-10 MED ORDER — GLIMEPIRIDE 4 MG PO TABS
4.0000 mg | ORAL_TABLET | Freq: Two times a day (BID) | ORAL | 1 refills | Status: DC
Start: 2024-01-10 — End: 2024-06-19

## 2024-01-10 MED ORDER — ALLOPURINOL 300 MG PO TABS: 300.0000 mg | ORAL_TABLET | Freq: Every day | ORAL | 3 refills | Status: DC

## 2024-01-10 MED ORDER — GABAPENTIN 100 MG PO CAPS: 100.0000 mg | ORAL_CAPSULE | Freq: Three times a day (TID) | ORAL | 3 refills | Status: AC

## 2024-01-10 MED ORDER — HYDROCHLOROTHIAZIDE 12.5 MG PO TABS
12.5000 mg | ORAL_TABLET | Freq: Every day | ORAL | 1 refills | Status: DC
Start: 2024-01-10 — End: 2024-02-24

## 2024-01-10 MED ORDER — MELOXICAM 15 MG PO TABS: 15.0000 mg | ORAL_TABLET | Freq: Every day | ORAL | 0 refills | Status: DC | PRN

## 2024-01-10 NOTE — Assessment & Plan Note (Signed)
 Refilled Crestor  40 mg daily, Zetia  10 mg daily Will check lipid panel at next visit LDL goal is less than 70

## 2024-01-10 NOTE — Assessment & Plan Note (Signed)
 Clonidine  0.2 mg one-time dose given in the office today Refilled hydrochlorothiazide  12.5 mg daily, spironolactone  12.5 mg daily, atenolol  100 mg daily Blood pressure goal is less than 130/80, monitor blood pressure at home keep a log and bring to next office visit Follow-up in 2 weeks DASH diet and commitment to daily physical activity for a minimum of 30 minutes discussed and encouraged, as a part of hypertension management. The importance of attaining a healthy weight is also discussed.     01/10/2024    3:43 PM 01/10/2024    2:20 PM 01/10/2024    2:03 PM 09/09/2023    2:25 PM 09/09/2023    2:12 PM 06/27/2023    2:55 PM 06/27/2023    2:41 PM  BP/Weight  Systolic BP 151 218 229 126 142 169 166  Diastolic BP 84 92 102 72 67 88 93  Wt. (Lbs)   296.4  299    BMI   47.84 kg/m2  48.26 kg/m2

## 2024-01-10 NOTE — Progress Notes (Signed)
 Established Patient Office Visit  Subjective:  Patient ID: Nancy Pham, female    DOB: Sep 19, 1965  Age: 58 y.o. MRN: [id number]  CC:  Chief Complaint  Patient presents with   Medical Management of Chronic Issues   Rash    Patient stated the rash on her arms stated a month ago.    HPI Nancy Pham is a 58 y.o. female  has a past medical history of Asthma, Cancer (HCC), Chronic pain of right knee, Diabetes mellitus, Gout, History of cervical cancer, History of ovarian cancer, Hyperlipidemia, Hyperlipidemia LDL goal <70, Hypertension, and Obesity.   Patient presents for follow-up for her chronic medical conditions Stated that she has been out of her medications for over 1 month due to lack of insurance and not being able to pay for her medications She would like all her medications to be sent to [facility] states that she can use [facility] to get her medications from that pharmacy  I reminded the patient to notify this office if she has any trouble getting any of her medications  She currently denies fever, chills, chest pain, shortness of breath, abdominal pain nausea vomiting edema     Past Medical History:  Diagnosis Date   Asthma    Cancer (HCC)    Chronic pain of right knee    Diabetes mellitus    Gout    History of cervical cancer    History of ovarian cancer    Hyperlipidemia    Hyperlipidemia LDL goal <70    Hypertension    Obesity     Past Surgical History:  Procedure Laterality Date   ABDOMINAL HYSTERECTOMY     BREAST BIOPSY Left 02/23/2023   MM LT BREAST BX W LOC DEV 1ST LESION IMAGE BX SPEC STEREO GUIDE 02/23/2023 [facility]   BREAST BIOPSY Right 02/23/2023   MM RT BREAST BX W LOC DEV 1ST LESION IMAGE BX SPEC STEREO GUIDE 02/23/2023 [facility]    Family History  Problem Relation Age of Onset   Seizures Mother    Heart attack Father    Heart attack Sister    Hypertension Sister    Hypertension Brother    Hypertension Brother    Hypertension  Brother    Hypertension Brother    Hypertension Sister     Social History   Socioeconomic History   Marital status: Single    Spouse name: Not on file   Number of children: Not on file   Years of education: Not on file   Highest education level: Not on file  Occupational History   Not on file  Tobacco Use   Smoking status: Never   Smokeless tobacco: Never  Vaping Use   Vaping status: Never Used  Substance and Sexual Activity   Alcohol use: No   Drug use: No   Sexual activity: Not Currently  Other Topics Concern   Not on file  Social History Narrative   Not on file   Social Drivers of Health   Financial Resource Strain: Not on file  Food Insecurity: No Food Insecurity (01/10/2024)   Hunger Vital Sign    Worried About Running Out of Food in the Last Year: Never true    Ran Out of Food in the Last Year: Never true  Transportation Needs: No Transportation Needs (01/10/2024)   PRAPARE - Administrator, Civil Service (Medical): No    Lack of Transportation (Non-Medical): No  Physical Activity: Not on file  Stress: Not on  file  Social Connections: Not on file  Intimate Partner Violence: Not on file    Outpatient Medications Prior to Visit  Medication Sig Dispense Refill   acetaminophen  (TYLENOL ) 325 MG tablet Take 325-650 mg by mouth every 6 (six) hours as needed for mild pain, fever or headache.      albuterol  (VENTOLIN  HFA) 108 (90 Base) MCG/ACT inhaler Inhale 1-2 puffs into the lungs every 6 (six) hours as needed for wheezing or shortness of breath. Please dispense 3 months supple. 8 g 11   Blood Glucose Monitoring Suppl DEVI Use to check up to twice a day. May substitute to any manufacturer covered by patient's insurance. 1 each 0   cetirizine (ZYRTEC) 10 MG tablet Take 10 mg by mouth daily.     Cholecalciferol  (VITAMIN D3) 125 MCG (5000 UT) CAPS Take 1 capsule (5,000 Units total) by mouth daily. 15 capsule 1   fluticasone  (FLONASE ) 50 MCG/ACT nasal spray  Place 2 sprays into both nostrils daily as needed for allergies or rhinitis. 11.1 mL 11   Multiple Vitamins-Minerals (CENTRUM SILVER  50+WOMEN) TABS Take 1 tablet by mouth daily. 90 tablet 1   Omega-3 Fatty Acids (FISH OIL PO) Take 1 capsule by mouth daily.     Probiotic Product (PROBIOTIC DAILY PO) Take 1 capsule by mouth daily.     ezetimibe  (ZETIA ) 10 MG tablet Take 1 tablet (10 mg total) by mouth daily. 90 tablet 3   aspirin  EC 81 MG tablet Take 1 tablet (81 mg total) by mouth daily. (Patient not taking: Reported on 01/10/2024) 90 tablet 1   Dulaglutide  (TRULICITY ) 3 MG/0.5ML SOAJ INJECT 3 MG AS DIRECTED ONCE A WEEK. (Patient not taking: Reported on 01/10/2024) 2 mL 2   famotidine  (PEPCID ) 20 MG tablet Take 1 tablet (20 mg total) by mouth daily. (Patient not taking: Reported on 01/10/2024) 90 tablet 3   ferrous sulfate  325 (65 FE) MG tablet Take 1 tablet (325 mg total) by mouth daily with breakfast. (Patient not taking: Reported on 01/10/2024) 90 tablet 3   Glucose Blood (BLOOD GLUCOSE TEST STRIPS) STRP Use to check up to twice a day. May substitute to any manufacturer covered by patient's insurance. (Patient not taking: Reported on 09/09/2023) 100 strip 3   Lancet Device MISC Use to check up to twice a day. May substitute to any manufacturer covered by patient's insurance. (Patient not taking: Reported on 09/09/2023) 1 each 0   Lancets Misc. MISC Use to check up to twice a day. May substitute to any manufacturer covered by patient's insurance. (Patient not taking: Reported on 09/09/2023) 100 each 3   allopurinol  (ZYLOPRIM ) 300 MG tablet Take 1 tablet (300 mg total) by mouth daily. (Patient not taking: Reported on 01/10/2024) 90 tablet 3   atenolol  (TENORMIN ) 100 MG tablet Take 1 tablet (100 mg total) by mouth daily. (Patient not taking: Reported on 01/10/2024) 90 tablet 1   gabapentin  (NEURONTIN ) 100 MG capsule Take 1 capsule (100 mg total) by mouth 3 (three) times daily. (Patient not taking: Reported on  01/10/2024) 270 capsule 3   glimepiride  (AMARYL ) 4 MG tablet Take 1 tablet (4 mg total) by mouth 2 (two) times daily. (Patient not taking: Reported on 01/10/2024) 120 tablet 1   hydrochlorothiazide  (HYDRODIURIL ) 12.5 MG tablet TAKE 1 TABLET BY MOUTH EVERY DAY (Patient not taking: Reported on 01/10/2024) 90 tablet 1   hydrocortisone  cream (HYDROCORTISONE  ANTI-ITCH) 1 % Apply 1 application topically 2 (two) times daily. (Patient not taking: Reported on 01/10/2024) 30 g  3   meloxicam  (MOBIC ) 15 MG tablet Take 1 tablet (15 mg total) by mouth daily as needed for pain. (Patient not taking: Reported on 01/10/2024) 30 tablet 0   rosuvastatin  (CRESTOR ) 40 MG tablet Take 1 tablet (40 mg total) by mouth daily. (Patient not taking: Reported on 01/10/2024) 90 tablet 1   spironolactone  (ALDACTONE ) 25 MG tablet Take 0.5 tablets (12.5 mg total) by mouth daily. (Patient not taking: Reported on 01/10/2024) 90 tablet 1   No facility-administered medications prior to visit.    Allergies  Allergen Reactions   Ace Inhibitors Swelling    Swelling of the lips   Tape Rash    No Band-Aids!!    ROS Review of Systems  Constitutional:  Negative for appetite change, chills, fatigue and fever.  HENT:  Negative for congestion, postnasal drip, rhinorrhea and sneezing.   Respiratory:  Negative for cough, shortness of breath and wheezing.   Cardiovascular:  Negative for chest pain, palpitations and leg swelling.  Gastrointestinal:  Negative for abdominal pain, constipation, nausea and vomiting.  Genitourinary:  Negative for difficulty urinating, dysuria, flank pain and frequency.  Musculoskeletal:  Negative for gait problem, joint swelling and myalgias.  Skin:  Positive for rash. Negative for color change, pallor and wound.  Neurological:  Negative for dizziness, facial asymmetry, weakness, numbness and headaches.  Psychiatric/Behavioral:  Negative for behavioral problems, confusion, self-injury and suicidal ideas.        Objective:    Physical Exam Vitals and nursing note reviewed.  Constitutional:      General: She is not in acute distress.    Appearance: Normal appearance. She is obese. She is not ill-appearing, toxic-appearing or diaphoretic.   Eyes:     General: No scleral icterus.       Right eye: No discharge.        Left eye: No discharge.     Extraocular Movements: Extraocular movements intact.     Conjunctiva/sclera: Conjunctivae normal.    Cardiovascular:     Rate and Rhythm: Normal rate and regular rhythm.     Pulses: Normal pulses.     Heart sounds: Normal heart sounds. No murmur heard.    No friction rub. No gallop.  Pulmonary:     Effort: Pulmonary effort is normal. No respiratory distress.     Breath sounds: Normal breath sounds. No stridor. No wheezing, rhonchi or rales.  Chest:     Chest wall: No tenderness.  Abdominal:     General: There is no distension.     Palpations: Abdomen is soft.     Tenderness: There is no abdominal tenderness. There is no right CVA tenderness, left CVA tenderness or guarding.   Musculoskeletal:        General: No swelling or signs of injury.     Right lower leg: No edema.     Left lower leg: No edema.   Skin:    General: Skin is warm and dry.     Capillary Refill: Capillary refill takes less than 2 seconds.     Coloration: Skin is not jaundiced or pale.     Findings: Rash present. No bruising, erythema or lesion.     Comments: Erythematous rahes noted on left arm    Neurological:     Mental Status: She is alert and oriented to person, place, and time.     Motor: No weakness.     Gait: Gait normal.   Psychiatric:        Mood and Affect:  Mood normal.        Behavior: Behavior normal.        Thought Content: Thought content normal.        Judgment: Judgment normal.     BP (!) 151/84   Pulse 65   Temp 98.5 F (36.9 C) (Oral)   Wt 296 lb 6.4 oz (134.4 kg)   SpO2 99%   BMI 47.84 kg/m  Wt Readings from Last 3 Encounters:   01/10/24 296 lb 6.4 oz (134.4 kg)  09/09/23 299 lb (135.6 kg)  06/27/23 293 lb (132.9 kg)    Lab Results  Component Value Date   TSH 1.680 12/14/2022   Lab Results  Component Value Date   WBC 7.5 01/11/2023   HGB 13.4 01/11/2023   HCT 41.3 01/11/2023   MCV 86.9 01/11/2023   PLT 327 01/11/2023   Lab Results  Component Value Date   NA 143 07/12/2023   K 4.1 07/12/2023   CO2 23 07/12/2023   GLUCOSE 85 07/12/2023   BUN 18 07/12/2023   CREATININE 0.71 07/12/2023   BILITOT 1.0 01/08/2023   ALKPHOS 102 01/08/2023   AST 43 (H) 01/08/2023   ALT 32 01/08/2023   PROT 8.5 (H) 01/08/2023   ALBUMIN 3.2 (L) 01/08/2023   CALCIUM  9.8 07/12/2023   ANIONGAP 13 01/11/2023   EGFR 98 07/12/2023   Lab Results  Component Value Date   CHOL 139 04/08/2023   Lab Results  Component Value Date   HDL 47 04/08/2023   Lab Results  Component Value Date   LDLCALC 64 04/08/2023   Lab Results  Component Value Date   TRIG 163 (H) 04/08/2023   Lab Results  Component Value Date   CHOLHDL 3.0 04/08/2023   Lab Results  Component Value Date   HGBA1C 12.8 (A) 01/10/2024      Assessment & Plan:   Problem List Items Addressed This Visit       Cardiovascular and Mediastinum   Hypertension   Clonidine  0.2 mg one-time dose given in the office today Refilled hydrochlorothiazide  12.5 mg daily, spironolactone  12.5 mg daily, atenolol  100 mg daily Blood pressure goal is less than 130/80, monitor blood pressure at home keep a log and bring to next office visit Follow-up in 2 weeks DASH diet and commitment to daily physical activity for a minimum of 30 minutes discussed and encouraged, as a part of hypertension management. The importance of attaining a healthy weight is also discussed.     01/10/2024    3:43 PM 01/10/2024    2:20 PM 01/10/2024    2:03 PM 09/09/2023    2:25 PM 09/09/2023    2:12 PM 06/27/2023    2:55 PM 06/27/2023    2:41 PM  BP/Weight  Systolic BP 151 218 229 126 142 169 166   Diastolic BP 84 92 102 72 67 88 93  Wt. (Lbs)   296.4  299    BMI   47.84 kg/m2  48.26 kg/m2             Relevant Medications   atenolol  (TENORMIN ) 100 MG tablet   ezetimibe  (ZETIA ) 10 MG tablet   hydrochlorothiazide  (HYDRODIURIL ) 12.5 MG tablet   spironolactone  (ALDACTONE ) 25 MG tablet   rosuvastatin  (CRESTOR ) 40 MG tablet     Endocrine   Uncontrolled type 2 diabetes mellitus with hyperglycemia (HCC)   Lab Results  Component Value Date   HGBA1C 12.8 (A) 01/10/2024  Restart glimepiride  4 mg twice daily Will referred patient to  the clinical pharmacist for medication assistance for Trulicity  Patient counseled on low-carb diet       Relevant Medications   glimepiride  (AMARYL ) 4 MG tablet   spironolactone  (ALDACTONE ) 25 MG tablet   rosuvastatin  (CRESTOR ) 40 MG tablet   Other Relevant Orders   POCT glycosylated hemoglobin (Hb A1C) (Completed)   Urine Albumin/Creatinine with ratio (send out) [LAB689]   Basic Metabolic Panel   CBC   Microalbumin / creatinine urine ratio   AMB Referral [facility]     Nervous and Auditory   Chronic bilateral low back pain with bilateral sciatica   Relevant Medications   gabapentin  (NEURONTIN ) 100 MG capsule   meloxicam  (MOBIC ) 15 MG tablet     Musculoskeletal and Integument   Rash and other nonspecific skin eruption - Primary    Has itchy rashes on the left arm that started  a week ago Kenalog  0.1% cream ordered apply twice daily to the affected sites      Relevant Medications   triamcinolone  cream (KENALOG ) 0.1 %     Other   Dyslipidemia, goal LDL below 70   Refilled Crestor  40 mg daily, Zetia  10 mg daily Will check lipid panel at next visit LDL goal is less than 70      Relevant Medications   atenolol  (TENORMIN ) 100 MG tablet   ezetimibe  (ZETIA ) 10 MG tablet   hydrochlorothiazide  (HYDRODIURIL ) 12.5 MG tablet   spironolactone  (ALDACTONE ) 25 MG tablet   rosuvastatin  (CRESTOR ) 40 MG tablet   Gout   Relevant  Medications   allopurinol  (ZYLOPRIM ) 300 MG tablet   Chronic pain of right knee   Gabapentin  100 mg 3 times daily, meloxicam  15 mg daily as needed refilled      Relevant Medications   meloxicam  (MOBIC ) 15 MG tablet   Vitamin D  deficiency   Last vitamin D  Lab Results  Component Value Date   VD25OH 41.9 09/10/2020         Relevant Orders   VITAMIN D  25 Hydroxy (Vit-D Deficiency, Fractures)    Meds ordered this encounter  Medications   triamcinolone  cream (KENALOG ) 0.1 %    Sig: Apply 1 Application topically 2 (two) times daily.    Dispense:  30 g    Refill:  0   allopurinol  (ZYLOPRIM ) 300 MG tablet    Sig: Take 1 tablet (300 mg total) by mouth daily.    Dispense:  90 tablet    Refill:  3   atenolol  (TENORMIN ) 100 MG tablet    Sig: Take 1 tablet (100 mg total) by mouth daily.    Dispense:  90 tablet    Refill:  1   ezetimibe  (ZETIA ) 10 MG tablet    Sig: Take 1 tablet (10 mg total) by mouth daily.    Dispense:  90 tablet    Refill:  3   glimepiride  (AMARYL ) 4 MG tablet    Sig: Take 1 tablet (4 mg total) by mouth 2 (two) times daily.    Dispense:  180 tablet    Refill:  1   gabapentin  (NEURONTIN ) 100 MG capsule    Sig: Take 1 capsule (100 mg total) by mouth 3 (three) times daily.    Dispense:  270 capsule    Refill:  3   hydrochlorothiazide  (HYDRODIURIL ) 12.5 MG tablet    Sig: Take 1 tablet (12.5 mg total) by mouth daily.    Dispense:  90 tablet    Refill:  1   meloxicam  (MOBIC ) 15 MG tablet  Sig: Take 1 tablet (15 mg total) by mouth daily as needed for pain.    Dispense:  30 tablet    Refill:  0   spironolactone  (ALDACTONE ) 25 MG tablet    Sig: Take 0.5 tablets (12.5 mg total) by mouth daily.    Dispense:  90 tablet    Refill:  1   rosuvastatin  (CRESTOR ) 40 MG tablet    Sig: Take 1 tablet (40 mg total) by mouth daily.    Dispense:  90 tablet    Refill:  1    Follow-up: Return in about 2 weeks (around 01/24/2024).    Brennan Karam R Keno Caraway, FNP

## 2024-01-10 NOTE — Assessment & Plan Note (Signed)
 Lab Results  Component Value Date   HGBA1C 12.8 (A) 01/10/2024  Restart glimepiride  4 mg twice daily Will referred patient to the clinical pharmacist for medication assistance for Trulicity  Patient counseled on low-carb diet

## 2024-01-10 NOTE — Patient Instructions (Signed)
 Goal for fasting blood sugar ranges from 80 to 120 and 2 hours after any meal or at bedtime should be between 130 to 170.   Around 3 times per week, check your blood pressure 2 times per day. once in the morning and once in the evening. The readings should be at least one minute apart. Write down these values and bring them to your next nurse visit/appointment.  When you check your BP, make sure you have been doing something calm/relaxing 5 minutes prior to checking. Both feet should be flat on the floor and you should be sitting. Use your left arm and make sure it is in a relaxed position (on a table), and that the cuff is at the approximate level/height of your heart. Blood pressure goal is less than 130/80      1. Rash and other nonspecific skin eruption (Primary)  - triamcinolone  cream (KENALOG ) 0.1 %; Apply 1 Application topically 2 (two) times daily.  Dispense: 30 g; Refill: 0  2. Chronic gout of multiple sites, unspecified cause  - allopurinol  (ZYLOPRIM ) 300 MG tablet; Take 1 tablet (300 mg total) by mouth daily.  Dispense: 90 tablet; Refill: 3  3. Primary hypertension  - atenolol  (TENORMIN ) 100 MG tablet; Take 1 tablet (100 mg total) by mouth daily.  Dispense: 90 tablet; Refill: 1 - hydrochlorothiazide  (HYDRODIURIL ) 12.5 MG tablet; Take 1 tablet (12.5 mg total) by mouth daily.  Dispense: 90 tablet; Refill: 1 - spironolactone  (ALDACTONE ) 25 MG tablet; Take 0.5 tablets (12.5 mg total) by mouth daily.  Dispense: 90 tablet; Refill: 1  4. Dyslipidemia, goal LDL below 70  - ezetimibe  (ZETIA ) 10 MG tablet; Take 1 tablet (10 mg total) by mouth daily.  Dispense: 90 tablet; Refill: 3 - rosuvastatin  (CRESTOR ) 40 MG tablet; Take 1 tablet (40 mg total) by mouth daily.  Dispense: 90 tablet; Refill: 1  5. Vitamin D  deficiency  - VITAMIN D  25 Hydroxy (Vit-D Deficiency, Fractures)  6. Chronic bilateral low back pain with bilateral sciatica  - gabapentin  (NEURONTIN ) 100 MG capsule; Take 1  capsule (100 mg total) by mouth 3 (three) times daily.  Dispense: 270 capsule; Refill: 3  7. Chronic pain of right knee  - meloxicam  (MOBIC ) 15 MG tablet; Take 1 tablet (15 mg total) by mouth daily as needed for pain.  Dispense: 30 tablet; Refill: 0  8. Uncontrolled type 2 diabetes mellitus with hyperglycemia (HCC)  - POCT glycosylated hemoglobin (Hb A1C) - Urine Albumin/Creatinine with ratio (send out) [LAB689] - Basic Metabolic Panel - CBC - Microalbumin / creatinine urine ratio - glimepiride  (AMARYL ) 4 MG tablet; Take 1 tablet (4 mg total) by mouth 2 (two) times daily.  Dispense: 180 tablet; Refill: 1 - spironolactone  (ALDACTONE ) 25 MG tablet; Take 0.5 tablets (12.5 mg total) by mouth daily.  Dispense: 90 tablet; Refill: 1  It is important that you exercise regularly at least 30 minutes 5 times a week as tolerated  Think about what you will eat, plan ahead. Choose  clean, green, fresh or frozen over canned, processed or packaged foods which are more sugary, salty and fatty. 70 to 75% of food eaten should be vegetables and fruit. Three meals at set times with snacks allowed between meals, but they must be fruit or vegetables. Aim to eat over a 12 hour period , example 7 am to 7 pm, and STOP after  your last meal of the day. Drink water,generally about 64 ounces per day, no other drink is as healthy. Fruit juice  is best enjoyed in a healthy way, by EATING the fruit.  Thanks for choosing [facility] we consider it a privelige to serve you.

## 2024-01-10 NOTE — Assessment & Plan Note (Signed)
  Has itchy rashes on the left arm that started  a week ago Kenalog  0.1% cream ordered apply twice daily to the affected sites

## 2024-01-10 NOTE — Assessment & Plan Note (Signed)
 Last vitamin D  Lab Results  Component Value Date   VD25OH 41.9 09/10/2020

## 2024-01-10 NOTE — Assessment & Plan Note (Signed)
 Gabapentin  100 mg 3 times daily, meloxicam  15 mg daily as needed refilled

## 2024-01-11 ENCOUNTER — Ambulatory Visit: Payer: Self-pay | Admitting: Nurse Practitioner

## 2024-01-11 LAB — CBC
Hematocrit: 42 % (ref 34.0–46.6)
Hemoglobin: 13.9 g/dL (ref 11.1–15.9)
MCH: 29.2 pg (ref 26.6–33.0)
MCHC: 33.1 g/dL (ref 31.5–35.7)
MCV: 88 fL (ref 79–97)
Platelets: 269 10*3/uL (ref 150–450)
RBC: 4.76 x10E6/uL (ref 3.77–5.28)
RDW: 12.3 % (ref 11.7–15.4)
WBC: 5.5 10*3/uL (ref 3.4–10.8)

## 2024-01-11 LAB — BASIC METABOLIC PANEL WITH GFR
BUN/Creatinine Ratio: 14 (ref 9–23)
BUN: 10 mg/dL (ref 6–24)
CO2: 22 mmol/L (ref 20–29)
Calcium: 9.3 mg/dL (ref 8.7–10.2)
Chloride: 97 mmol/L (ref 96–106)
Creatinine, Ser: 0.71 mg/dL (ref 0.57–1.00)
Glucose: 298 mg/dL — ABNORMAL HIGH (ref 70–99)
Potassium: 4.1 mmol/L (ref 3.5–5.2)
Sodium: 135 mmol/L (ref 134–144)
eGFR: 98 mL/min/{1.73_m2} (ref >59)

## 2024-01-11 LAB — MICROALBUMIN / CREATININE URINE RATIO
Creatinine, Urine: 239.2 mg/dL
Microalb/Creat Ratio: 564 mg/g{creat} — ABNORMAL HIGH (ref 0–29)
Microalbumin, Urine: 1349.8 ug/mL

## 2024-01-11 LAB — VITAMIN D 25 HYDROXY (VIT D DEFICIENCY, FRACTURES): Vit D, 25-Hydroxy: 50.1 ng/mL (ref 30.0–100.0)

## 2024-01-12 ENCOUNTER — Other Ambulatory Visit (INDEPENDENT_AMBULATORY_CARE_PROVIDER_SITE_OTHER): Payer: Self-pay

## 2024-01-12 ENCOUNTER — Other Ambulatory Visit: Payer: Self-pay

## 2024-01-12 DIAGNOSIS — E1165 Type 2 diabetes mellitus with hyperglycemia: Secondary | ICD-10-CM

## 2024-01-12 MED ORDER — TRULICITY 1.5 MG/0.5ML ~~LOC~~ SOAJ
1.5000 mg | SUBCUTANEOUS | 2 refills | Status: DC
  Filled 2024-01-12 – 2024-03-08 (×2): qty 2, 28d supply, fill #0
  Filled 2024-04-04 (×2): qty 2, 28d supply, fill #1
  Filled 2024-05-18: qty 2, 28d supply, fill #2

## 2024-01-12 MED ORDER — TRULICITY 0.75 MG/0.5ML ~~LOC~~ SOAJ
0.7500 mg | SUBCUTANEOUS | 0 refills | Status: DC
  Filled 2024-01-12: qty 2, 28d supply, fill #0

## 2024-01-12 NOTE — Progress Notes (Signed)
 01/12/2024 Name: Nancy Pham MRN: [id number] DOB: 04-30-1966  Chief Complaint  Patient presents with   Diabetes   Hypertension   Hyperlipidemia    Nancy Pham is a 58 y.o. year old female who presented for a telephone visit.   They were referred to the pharmacist by their PCP for assistance in managing diabetes, hypertension, and hyperlipidemia. PMH includes asthma, HTN, T2DM, HLD, gout, BMI > 45.    Subjective: Patient was last seen by PCP, Hillis Lu, NP on 01/10/24. She reported being out of all her medications for > 1 mo due to lack of insurance and income. Her BP was initially elevated to 229/102 mmHg. She was given clonidine  in the office and BP was 151/84 mmHg upon leaving. Her A1C increased from 5.9% to 12.8%. She was previously controlled on Trulicity  (up to 3 mg weekly) and glimepiride .  Today, patient reports that she picked up her medications from [facility] on 01/11/24. She reports that she is just getting up and she has not taken her medications yet this AM. She reports the only medication she was not able to get is Trulicity .   Care Team: Primary Care Provider: Paseda, Folashade R, FNP ; Next Scheduled Visit: 02/22/24  Medication Access/Adherence  Current Pharmacy:  [id number] [place], [place] - [address] [place] [address] Phone: [phone] Fax: [phone]  [facility] [address] [place], [name] - [address] [facility]. AT [address] [place]. [name] [address] Phone: [phone] Fax: [phone]  [facility] [address] [name] [address] Phone: [phone] Fax: [phone]   Patient reports affordability concerns with their medications: Yes  - lost insurance and has not been taking her medications since February. I attempted to outreach her to set her up with [facility] program in February and was not able  to reach her and she did not return my call. She reports she has applied for Medicaid in the past but it has been several years. She used [facility] to pick up her medications yesterday at [facility].   Patient reports access/transportation concerns to their pharmacy: No  - [facility] pharmacy is more convenient for her, but she is willing to go to [facility] at [facility] for [facility] Trulicity   Patient reports adherence concerns with their medications:  Yes     Diabetes:  Current medications: glimepiride  4 mg BID (just picked up yesterday - has not taken yet this morning) Medications tried in the past: metformin XR/IR - nausea and vomiting, Victoza  - did well, lost insurance and had to stop  Current glucose readings: has meter, but has not been checking.   Patient denies hypoglycemic s/sx including dizziness, shakiness, sweating. Patient denies hyperglycemic symptoms including polyuria, polydipsia, polyphagia, nocturia, neuropathy, blurred vision.  Current meal patterns: did not discuss in detail today  Current physical activity: did not discuss in detail today  Current medication access support: none- willing to obtain [facility] Trulicity  from [facility]  Hypertension:  Current medications: atenolol  100 mg daily, HCZT 12.5 mg daily, spironolactone  25 mg daily Medications previously tried: lip/tongue swelling with lisinopril, amlodipine  - LE swelling, chlorthalidone  - hypokalemia   Patient has a validated, automated, upper arm home BP cuff - checked while on the phone today: 253/124 mmHg - patient has not taken her BP medications yet this morning. Instructed her to take her medications during the telephone visit. She should  recheck her BP in 1 hour, and again in 2 hours to ensure that her BP is coming down with medication. If there is no improvement, or if she develops any symptoms of hypertensive emergency including HA, blurred vision, s/sx of stroke, chest pain, she should call 911 or go to the  emergency room.    Patient denies hypotensive s/sx including dizziness, lightheadedness.  Patient denies hypertensive symptoms including headache, chest pain, shortness of breath. Says she feels great.    Hyperlipidemia/ASCVD Risk Reduction  Current lipid lowering medications: rosuvastatin  40 mg daily  Antiplatelet regimen: aspirin  81 mg daily  ASCVD History: none Family History: MI in Father, MI in sister Risk Factors: T2DM, HTN, HLD   Clinical ASCVD: No  The 10-year ASCVD risk score (Arnett DK, et al., 2019) is: 18.2%   Values used to calculate the score:     Age: 64 years     Clincally relevant sex: Female     Is Non-Hispanic African American: Yes     Diabetic: Yes     Tobacco smoker: No     Systolic Blood Pressure: 151 mmHg     Is BP treated: Yes     HDL Cholesterol: 47 mg/dL     Total Cholesterol: 139 mg/dL     Objective:  BP Readings from Last 3 Encounters:  01/10/24 (!) 151/84  09/09/23 126/72  06/27/23 (!) 169/88   Lab Results  Component Value Date   HGBA1C 12.8 (A) 01/10/2024   HGBA1C 5.9 (A) 06/27/2023   HGBA1C 8.9 (A) 03/25/2023    Lab Results  Component Value Date   CREATININE 0.71 01/10/2024   BUN 10 01/10/2024   NA 135 01/10/2024   K 4.1 01/10/2024   CL 97 01/10/2024   CO2 22 01/10/2024    Lab Results  Component Value Date   CHOL 139 04/08/2023   HDL 47 04/08/2023   LDLCALC 64 04/08/2023   LDLDIRECT 62 04/08/2023   TRIG 163 (H) 04/08/2023   CHOLHDL 3.0 04/08/2023    Medications Reviewed Today     Reviewed by Adra Alanis, RPH (Pharmacist) on 01/12/24 at 1311  Med List Status: <None>   Medication Order Taking? Sig Documenting Provider Last Dose Status Informant  acetaminophen  (TYLENOL ) 325 MG tablet [id number]  Take 325-650 mg by mouth every 6 (six) hours as needed for mild pain, fever or headache.  [provider]  Active Self           Med Note April Knack, Kimberlee Peng Dec 23, 2022 10:24 AM)    albuterol  (VENTOLIN   HFA) [phone] Base) MCG/ACT inhaler [id number]  Inhale 1-2 puffs into the lungs every 6 (six) hours as needed for wheezing or shortness of breath. Please dispense 3 months supple. Stroud, Natalie M, FNP  Active            Med Note Ace Abu, [facility]   Fri Feb 25, 2023 10:56 AM) Prn   allopurinol  (ZYLOPRIM ) 300 MG tablet [id number]  Take 1 tablet (300 mg total) by mouth daily. Paseda, Folashade R, FNP  Active   aspirin  EC 81 MG tablet [id number]  Take 1 tablet (81 mg total) by mouth daily.  Patient not taking: Reported on 01/10/2024   Stroud, Natalie M, FNP  Active   atenolol  (TENORMIN ) 100 MG tablet [id number]  Take 1 tablet (100 mg total) by mouth daily. Paseda, Folashade R, FNP  Active   Blood Glucose Monitoring Suppl DEVI [id number]  Use to check up to twice  a day. May substitute to any manufacturer covered by patient's insurance. Paseda, Folashade R, FNP  Active   cetirizine (ZYRTEC) 10 MG tablet [id number]  Take 10 mg by mouth daily. [provider]  Active   Cholecalciferol  (VITAMIN D3) 125 MCG (5000 UT) CAPS [id number]  Take 1 capsule (5,000 Units total) by mouth daily. Stroud, Natalie M, FNP  Active   Dulaglutide  (TRULICITY ) 3 MG/0.5ML [place] [id number]  INJECT 3 MG AS DIRECTED ONCE A WEEK.  Patient not taking: Reported on 01/10/2024   Paseda, Folashade R, FNP  Active   ezetimibe  (ZETIA ) 10 MG tablet [id number]  Take 1 tablet (10 mg total) by mouth daily. Paseda, Folashade R, FNP  Active   famotidine  (PEPCID ) 20 MG tablet [id number]  Take 1 tablet (20 mg total) by mouth daily.  Patient not taking: Reported on 01/10/2024   Gregoria Leas, NP  Active   ferrous sulfate  325 (65 FE) MG tablet [id number]  Take 1 tablet (325 mg total) by mouth daily with breakfast.  Patient not taking: Reported on 01/10/2024   Stroud, Natalie M, FNP  Active   fluticasone  (FLONASE ) 50 MCG/ACT nasal spray [id number]  Place 2 sprays into both nostrils daily as needed for allergies or rhinitis. Stroud, Natalie M, FNP  Active             Med Note April Knack, Kimberlee Peng Dec 23, 2022 10:24 AM)    gabapentin  (NEURONTIN ) 100 MG capsule [id number]  Take 1 capsule (100 mg total) by mouth 3 (three) times daily. Paseda, Folashade R, FNP  Active   glimepiride  (AMARYL ) 4 MG tablet [id number]  Take 1 tablet (4 mg total) by mouth 2 (two) times daily. Paseda, Folashade R, FNP  Active   Glucose Blood (BLOOD GLUCOSE TEST STRIPS) STRP [id number] No Use to check up to twice a day. May substitute to any manufacturer covered by patient's insurance.  Patient not taking: Reported on 09/09/2023   Paseda, Folashade R, FNP Not Taking Active   hydrochlorothiazide  (HYDRODIURIL ) 12.5 MG tablet [id number]  Take 1 tablet (12.5 mg total) by mouth daily. Paseda, Folashade R, FNP  Active   Lancet Device MISC [id number] No Use to check up to twice a day. May substitute to any manufacturer covered by patient's insurance.  Patient not taking: Reported on 09/09/2023   Paseda, Folashade R, FNP Not Taking Active   Lancets Misc. MISC [id number] No Use to check up to twice a day. May substitute to any manufacturer covered by patient's insurance.  Patient not taking: Reported on 09/09/2023   Paseda, Folashade R, FNP Not Taking Active   Liraglutide  (VICTOZA ) 18 MG/3ML SOLN injection [id number] No Inject 1.8 mLs (10.8 mg total) into the skin daily. Renold Cashing, MD Taking Active Self           Med Note Guido Leeks, Sheryn Doom Aug 21, 2017  4:16 PM) Regimen verified by the patient  meloxicam  (MOBIC ) 15 MG tablet [id number]  Take 1 tablet (15 mg total) by mouth daily as needed for pain. Paseda, Folashade R, FNP  Active   Multiple Vitamins-Minerals (CENTRUM SILVER  50+WOMEN) TABS [id number]  Take 1 tablet by mouth daily. Gregoria Leas, NP  Active   Omega-3 Fatty Acids (FISH OIL PO) [id number]  Take 1 capsule by mouth daily. [provider]  Active   Probiotic Product (PROBIOTIC DAILY PO) [id number]  Take 1 capsule by mouth daily. [provider]  Active Self   rosuvastatin  (CRESTOR ) 40 MG tablet  [id number]  Take 1 tablet (40 mg total) by mouth daily. Paseda, Folashade R, FNP  Active   spironolactone  (ALDACTONE ) 25 MG tablet [id number]  Take 0.5 tablets (12.5 mg total) by mouth daily. Paseda, Folashade R, FNP  Active   triamcinolone  cream (KENALOG ) 0.1 % [id number]  Apply 1 Application topically 2 (two) times daily. Paseda, Folashade R, FNP  Active               Assessment/Plan:   Diabetes: - Currently uncontrolled with most recent A1C of 12.8% above goal < 7% in the setting of not taking any medications for several months due to losing insurance and cost barriers. She has resumed glimepiride  yesterday, and is willing to pick up Trulicity  from [facility] via [facility] supply. She was previously controlled with an A1C 5.9% on these two medications. We will need to restart her titration ot Trulicity  to avoid GI AE.  - Reviewed long term cardiovascular and renal outcomes of uncontrolled blood sugar - Reviewed goal A1c, goal fasting, and goal 2 hour post prandial glucose - Recommend to start Trulicity  0.75 mg once weekly - Recommend to continue glimepiride  4 mg BID with meals.  - Patient denies personal or family history of multiple endocrine neoplasia type 2, medullary thyroid  cancer; personal history of pancreatitis or gallbladder disease. - Recommend to check glucose once daily fasting - Next A1C due Sept 2025   Hypertension: - Currently uncontrolled and severely elevated during phone call today and in clinic yesterday. Counseled patient on the importance of daily adherence to her BP medications and the danger of having hypertensive urgency. Because patient has been out of medications for months, will plan to slowly titrate her BP medications to achieve control. She is unable to take ACEi/ARB due to a hx of angioedema and had issues with LE swelling with amlodipine  in the past. Would favor escalating hydrochlorothiazide  first. In the future, could  consider switching atenolol  to carvedilol and/or titrating spironolactone  at follow-up pending repeat labs since restarting medications.  - Reviewed long term cardiovascular and renal outcomes of uncontrolled blood pressure - Reviewed appropriate blood pressure monitoring technique and reviewed goal blood pressure. Recommended to check home blood pressure and heart rate in 1 hour, and again in 2 hours after taking her medications o ensure that her BP is coming down. If there is no improvement, or if she develops any symptoms of hypertensive emergency including HA, blurred vision, s/sx of stroke, chest pain, she should call 911 or go to the emergency room. - Recommend to continue atenolol  100 mg daily, hydrochlorothiazide  12.5 mg daily, spironolactone  25 mg daily  - Needs repeat BMET at next PCP appt after starting medications    Hyperlipidemia/ASCVD Risk Reduction: - Currently controlled with last LDL-C 62 mg/dL below goal < 70 mg/dL. However, likely uncontrolled now since she has been off of high intensity statin therapy. Medication was resumed yesterday - can obtain repeat lipid panel in 6-12 weeks. - Reviewed long term complications of uncontrolled cholesterol - Recommend to continue rosuvastatin  40 mg daily  - Repeat lipid panel in 6-12 weeks  Follow Up Plan: Pharmacist telephone 02/15/24, PCP 02/22/24  Arthea Larsson, PharmD PGY1 Pharmacy Resident

## 2024-01-12 NOTE — Progress Notes (Signed)
Opened in error.  See Patient Outreach encounter

## 2024-01-13 ENCOUNTER — Other Ambulatory Visit: Payer: Self-pay

## 2024-01-16 ENCOUNTER — Other Ambulatory Visit: Payer: Self-pay

## 2024-02-08 ENCOUNTER — Other Ambulatory Visit: Payer: Self-pay | Admitting: Nurse Practitioner

## 2024-02-08 ENCOUNTER — Other Ambulatory Visit: Payer: Self-pay

## 2024-02-08 DIAGNOSIS — E1165 Type 2 diabetes mellitus with hyperglycemia: Secondary | ICD-10-CM

## 2024-02-08 MED ORDER — TRULICITY 0.75 MG/0.5ML ~~LOC~~ SOAJ
0.7500 mg | SUBCUTANEOUS | 0 refills | Status: DC
  Filled 2024-02-08: qty 2, 28d supply, fill #0

## 2024-02-10 ENCOUNTER — Other Ambulatory Visit: Payer: Self-pay

## 2024-02-15 ENCOUNTER — Other Ambulatory Visit (INDEPENDENT_AMBULATORY_CARE_PROVIDER_SITE_OTHER): Payer: Self-pay

## 2024-02-15 DIAGNOSIS — E1165 Type 2 diabetes mellitus with hyperglycemia: Secondary | ICD-10-CM

## 2024-02-15 DIAGNOSIS — I10 Essential (primary) hypertension: Secondary | ICD-10-CM

## 2024-02-15 NOTE — Progress Notes (Signed)
 02/15/2024 Name: Nancy Pham MRN: [id number] DOB: Jan 16, 1966  Chief Complaint  Patient presents with   Diabetes   Hyperlipidemia   Hypertension    Nancy Pham is a 58 y.o. year old female who presented for a telephone visit.   They were referred to the pharmacist by their PCP for assistance in managing diabetes, hypertension, and hyperlipidemia. PMH includes asthma, HTN, T2DM, HLD, gout, BMI > 45.    Subjective: Patient was last seen by PCP, Nancy Shall, NP on 01/10/24. She reported being out of all her medications for > 1 mo due to lack of insurance and income. Her BP was initially elevated to 229/102 mmHg. She was given clonidine  in the office and BP was 151/84 mmHg upon leaving. Her A1C increased from 5.9% to 12.8%. She was previously controlled on Trulicity  (up to 3 mg weekly) and glimepiride . She was engaged by pharmacy via telephone on 01/12/24. She was counseled on the importance of adherence and monitoring her BP and BG at home. Her BP was severely elevated > 200/100 mmHg during out last telephone call. She was not having s/sx of hypertensive emergency. She was instructed to take her medications, recheck her BP, and was given ED precautions. We also assisted in setting her up to receive DOH Trulicity  from [facility].   Today, patient reports doing well. She has not been monitoring her BP and BG as instructed. She reports she has been taking her medications, but has not taken them yet today (although she normally takes them in the morning).  Care Team: Primary Care Provider: Paseda, Folashade R, FNP ; Next Scheduled Visit: 02/22/24  Medication Access/Adherence  Current Pharmacy:  [id number] [place], [place] - [address] [address] Phone: [phone] Fax: [phone]  [facility] [address] [place], [name] - [address] [facility]. AT [address] [place]. [name] [address] Phone:  [phone] Fax: [phone]  [facility] - [facility] [address], [address] [name] [address] Phone: [phone] Fax: [phone]   Patient reports affordability concerns with their medications: Yes  - lost insurance and had not been taking her medications from February to June. She reports she has applied for Medicaid in the past but it has been several years. She used [facility] to pick up her medications at [facility] for 90ds in June.   Patient reports access/transportation concerns to their pharmacy: No  - [facility] pharmacy is more convenient for her, but she is willing to go to [facility] Trulicity   Patient reports adherence concerns with their medications:  Yes     Diabetes:  Current medications: glimepiride  4 mg BID, Trulicity  0.75 mg weekly (Fridays)  Medications tried in the past: metformin XR/IR - nausea and vomiting, Victoza  - did well, lost insurance and had to stop  Denies GI AE since restarting Trulicity .  Current glucose readings: has meter, but has not been checking.   Patient denies hypoglycemic s/sx including dizziness, shakiness, sweating. Patient denies hyperglycemic symptoms including polyuria, polydipsia, polyphagia, nocturia, neuropathy, blurred vision.  Current meal patterns: Has been difficult to cut out sweet foods. Feels like Trulicity  is helping. Tries to eat 3 meals every day.  Breakfast: grits (less than 1/2 cup) and eggs Lunch: sandwich Dinner: chicken, salad  Drinks: water only, occasional ginger ale or juice.  Current physical activity: walking back and forth at home, nothing intentional  Current medication access support: DOH Trulicity  from [facility]  Hypertension:  Current medications: atenolol  100 mg daily, HCZT 12.5 mg daily, spironolactone  25 mg daily Medications previously tried: lip/tongue swelling with lisinopril, amlodipine  - LE swelling, chlorthalidone  -  hypokalemia   Patient has a validated, automated, upper arm home BP cuff - checked while on the phone today (reports she has not taken her medications yet):  172/92 mmHg 198/102 mmHg  Patient denies hypotensive s/sx including dizziness, lightheadedness.  Patient denies hypertensive symptoms including headache, chest pain, shortness of breath. Says she feels great.    Hyperlipidemia/ASCVD Risk Reduction  Current lipid lowering medications: rosuvastatin  40 mg daily  Antiplatelet regimen: aspirin  81 mg daily  ASCVD History: none Family History: MI in Father, MI in sister Risk Factors: T2DM, HTN, HLD   Clinical ASCVD: No  The 10-year ASCVD risk score (Arnett DK, et al., 2019) is: 18.8%   Values used to calculate the score:     Age: 24 years     Clincally relevant sex: Female     Is Non-Hispanic African American: Yes     Diabetic: Yes     Tobacco smoker: No     Systolic Blood Pressure: 151 mmHg     Is BP treated: Yes     HDL Cholesterol: 47 mg/dL     Total Cholesterol: 139 mg/dL     Objective:  BP Readings from Last 3 Encounters:  01/10/24 (!) 151/84  09/09/23 126/72  06/27/23 (!) 169/88   Lab Results  Component Value Date   HGBA1C 12.8 (A) 01/10/2024   HGBA1C 5.9 (A) 06/27/2023   HGBA1C 8.9 (A) 03/25/2023    Lab Results  Component Value Date   CREATININE 0.71 01/10/2024   BUN 10 01/10/2024   NA 135 01/10/2024   K 4.1 01/10/2024   CL 97 01/10/2024   CO2 22 01/10/2024    Lab Results  Component Value Date   CHOL 139 04/08/2023   HDL 47 04/08/2023   LDLCALC 64 04/08/2023   LDLDIRECT 62 04/08/2023   TRIG 163 (H) 04/08/2023   CHOLHDL 3.0 04/08/2023    Medications Reviewed Today     Reviewed by Nancy Pham, RPH (Pharmacist) on 02/15/24 at 1600  Med List Status: <None>   Medication Order Taking? Sig Documenting Provider Last Dose Status Informant  acetaminophen  (TYLENOL ) 325 MG tablet [id number]  Take 325-650 mg by mouth every 6 (six) hours as needed for  mild pain, fever or headache.  [provider]  Active Self           Med Note BUZZ, DOROTHYANN ONEIDA Schaumann Dec 23, 2022 10:24 AM)    albuterol  (VENTOLIN  HFA) 108 [phone] Base) MCG/ACT inhaler [id number]  Inhale 1-2 puffs into the lungs every 6 (six) hours as needed for wheezing or shortness of breath. Please dispense 3 months supple. Stroud, Natalie M, FNP  Active            Med Note SAMULE, [facility]   Fri Feb 25, 2023 10:56 AM) Prn   allopurinol  (ZYLOPRIM ) 300 MG tablet [id number] Yes Take 1 tablet (300 mg total) by mouth daily. Paseda, Folashade R, FNP  Active   aspirin  EC 81 MG tablet [id number]  Take 1 tablet (81 mg total) by mouth daily.  Patient not taking: Reported on 01/10/2024   Stroud, Natalie M, FNP  Active   atenolol  (TENORMIN ) 100 MG tablet [id number] Yes Take 1 tablet (100 mg total) by mouth daily. Paseda, Folashade R, FNP  Active   Blood Glucose Monitoring Suppl DEVI [id number]  Use to check up to twice a day. May substitute to any manufacturer covered by patient's insurance. Paseda, Folashade R, FNP  Active   cetirizine (ZYRTEC) 10 MG tablet [id number]  Take 10 mg by mouth daily. [provider]  Active   Cholecalciferol  (VITAMIN D3) 125 MCG (5000 UT) CAPS [id number]  Take 1 capsule (5,000 Units total) by mouth daily. Stroud, Natalie M, FNP  Active     Discontinued 02/15/24 1305 (Dose change)   Dulaglutide  (TRULICITY ) 1.5 MG/0.5ML SOAJ [id number]  Inject 1.5 mg into the skin once a week. Paseda, Folashade R, FNP  Active   ezetimibe  (ZETIA ) 10 MG tablet [id number] Yes Take 1 tablet (10 mg total) by mouth daily. Paseda, Folashade R, FNP  Active   famotidine  (PEPCID ) 20 MG tablet [id number]  Take 1 tablet (20 mg total) by mouth daily.  Patient not taking: Reported on 01/10/2024   Myrna Camelia HERO, NP  Active   ferrous sulfate  325 (65 FE) MG tablet [id number]  Take 1 tablet (325 mg total) by mouth daily with breakfast.  Patient not taking: Reported on 01/10/2024   Stroud, Natalie M,  FNP  Active   fluticasone  (FLONASE ) 50 MCG/ACT nasal spray [id number]  Place 2 sprays into both nostrils daily as needed for allergies or rhinitis. Stroud, Natalie M, FNP  Active            Med Note BUZZ, DOROTHYANN ONEIDA Schaumann Dec 23, 2022 10:24 AM)    gabapentin  (NEURONTIN ) 100 MG capsule [id number] Yes Take 1 capsule (100 mg total) by mouth 3 (three) times daily. Paseda, Folashade R, FNP  Active   glimepiride  (AMARYL ) 4 MG tablet [id number] Yes Take 1 tablet (4 mg total) by mouth 2 (two) times daily. Paseda, Folashade R, FNP  Active   Glucose Blood (BLOOD GLUCOSE TEST STRIPS) STRP [id number]  Use to check up to twice a day. May substitute to any manufacturer covered by patient's insurance.  Patient not taking: Reported on 09/09/2023   Paseda, Folashade R, FNP  Active   hydrochlorothiazide  (HYDRODIURIL ) 12.5 MG tablet [id number] Yes Take 1 tablet (12.5 mg total) by mouth daily. Paseda, Folashade R, FNP  Active   Lancet Device MISC [id number]  Use to check up to twice a day. May substitute to any manufacturer covered by patient's insurance.  Patient not taking: Reported on 09/09/2023   Paseda, Folashade R, FNP  Active   Lancets Misc. MISC [id number]  Use to check up to twice a day. May substitute to any manufacturer covered by patient's insurance.  Patient not taking: Reported on 09/09/2023   Paseda, Folashade R, FNP  Active   meloxicam  (MOBIC ) 15 MG tablet [id number]  Take 1 tablet (15 mg total) by mouth daily as needed for pain. Paseda, Folashade R, FNP  Active   Multiple Vitamins-Minerals (CENTRUM SILVER  50+WOMEN) TABS [id number]  Take 1 tablet by mouth daily. Myrna Camelia HERO, NP  Active   Omega-3 Fatty Acids (FISH OIL PO) [id number]  Take 1 capsule by mouth daily. [provider]  Active   Probiotic Product (PROBIOTIC DAILY PO) [id number]  Take 1 capsule by mouth daily. [provider]  Active Self  rosuvastatin  (CRESTOR ) 40 MG tablet [id number] Yes Take 1 tablet (40 mg total) by mouth  daily. Paseda, Folashade R, FNP  Active   spironolactone  (ALDACTONE ) 25 MG tablet [id number] Yes Take 0.5 tablets (12.5 mg total) by mouth daily. Paseda, Folashade R, FNP  Active   triamcinolone  cream (KENALOG ) 0.1 % [id number]  Apply 1 Application topically 2 (two) times daily. Paseda, Folashade R, FNP  Active               Assessment/Plan:   Diabetes: - Currently uncontrolled with most recent A1C of 12.8% above goal < 7% in the setting of not taking any medications for several months due to losing insurance and cost barriers. Anticipate slight improvement in glycemic control since restarting Trulicity  and glimepiride , but there is no SMBG to review. Emphasized importance of monitoring. Instructed patient to increase to Trulicity  1.5 mg weekly at next fill since she is tolerating well.  - Reviewed long term cardiovascular and renal outcomes of uncontrolled blood sugar - Reviewed goal A1c, goal fasting, and goal 2 hour post prandial glucose - Reviewed hypoglycemia management plan and the rule of 15 - Reviewed dietary modifications including  utilizing the healthy plate method, limiting portion size of carbohydrate foods, increasing intake of protein and non-starchy vegetables. Counseled patient to stay hydrated with water throughout the day. - Reviewed lifestyle modifications including: aiming for 150 minutes of moderate intensity exercise every week.  - Recommend to continue Trulicity  0.75 mg once weekly and increase to 1.5 mg weekly at next fill.  - Recommend to continue glimepiride  4 mg BID with meals.  - Patient denies personal or family history of multiple endocrine neoplasia type 2, medullary thyroid  cancer; personal history of pancreatitis or gallbladder disease. - Recommend to check glucose once daily fasting - Next A1C due Sept 2025   Hypertension: - Currently uncontrolled and elevated during phone call today, though improved compared to previous telephone and PCP visit. Again,  counseled patient on the importance of daily adherence to her BP medications and the danger of having hypertensive urgency. Additionally emphasized importance of monitoring. Suspect that patient may be struggling with mental health due to lack of motivation to provide self care. Encouraged her to tell her sister about her health conditions and need to monitor, so that someone in her household could hold her accountable. She is unable to take ACEi/ARB due to a hx of angioedema and had issues with LE swelling with amlodipine  in the past. Would favor escalating hydrochlorothiazide  first. In the future, could consider switching atenolol  to carvedilol and/or titrating spironolactone  at follow-up pending repeat labs since restarting medications.  - Reviewed long term cardiovascular and renal outcomes of uncontrolled blood pressure - Reviewed appropriate blood pressure monitoring technique and reviewed goal blood pressure. Recommended to check home blood pressure and heart rate once daily every day until PCP appt on 02/22/24. She should bring log to this appointment and I will review in collaboration with her PCP. - Recommend to continue atenolol  100 mg daily, hydrochlorothiazide  12.5 mg daily, spironolactone  25 mg daily  - Needs repeat BMET at next PCP appt after starting medications   Hyperlipidemia/ASCVD Risk Reduction: - Currently controlled with last LDL-C 62 mg/dL below goal < 70 mg/dL. High intensity statin was resumed in June 2025.  - Reviewed long term complications of uncontrolled cholesterol - Recommend to continue rosuvastatin  40 mg daily  - Repeat lipid panel in 6-12 weeks   Follow Up Plan: PCP 02/22/24 (plan for PharmD consult), PharmD telephone 03/20/24  Lorain Baseman, PharmD [facility] [phone]

## 2024-02-22 ENCOUNTER — Ambulatory Visit (INDEPENDENT_AMBULATORY_CARE_PROVIDER_SITE_OTHER): Payer: Self-pay | Admitting: Nurse Practitioner

## 2024-02-22 ENCOUNTER — Encounter: Payer: Self-pay | Admitting: Nurse Practitioner

## 2024-02-22 VITALS — BP 139/87 | HR 74 | Temp 96.8°F | Wt 295.0 lb

## 2024-02-22 DIAGNOSIS — G8929 Other chronic pain: Secondary | ICD-10-CM

## 2024-02-22 DIAGNOSIS — E785 Hyperlipidemia, unspecified: Secondary | ICD-10-CM

## 2024-02-22 DIAGNOSIS — M25561 Pain in right knee: Secondary | ICD-10-CM

## 2024-02-22 DIAGNOSIS — I10 Essential (primary) hypertension: Secondary | ICD-10-CM

## 2024-02-22 DIAGNOSIS — E1165 Type 2 diabetes mellitus with hyperglycemia: Secondary | ICD-10-CM

## 2024-02-22 DIAGNOSIS — M25521 Pain in right elbow: Secondary | ICD-10-CM | POA: Insufficient documentation

## 2024-02-22 MED ORDER — MELOXICAM 15 MG PO TABS: 15.0000 mg | ORAL_TABLET | Freq: Every day | ORAL | 0 refills | Status: DC | PRN

## 2024-02-22 MED ORDER — KETOROLAC TROMETHAMINE 30 MG/ML IJ SOLN
30.0000 mg | Freq: Once | INTRAMUSCULAR | Status: AC
  Administered 2024-02-22: 30 mg via INTRAMUSCULAR

## 2024-02-22 NOTE — Assessment & Plan Note (Signed)
 Blood pressure has improved but not at goal of less than 130/80 Home blood pressure log reviewed today Currently on spironolactone  12.5 mg daily, hydrochlorothiazide  12.5 mg daily, atenolol  100 mg daily Checking BMP, will increase hydrochlorothiazide  to 25 mg daily if kidney function is stable DASH diet and commitment to daily physical activity for a minimum of 30 minutes discussed and encouraged, as a part of hypertension management. Appreciate collaboration with the clinical pharmacist     02/22/2024    4:09 PM 02/22/2024    3:58 PM 01/10/2024    3:43 PM 01/10/2024    2:20 PM 01/10/2024    2:03 PM 09/09/2023    2:25 PM 09/09/2023    2:12 PM  BP/Weight  Systolic BP 139 141 151 218 229 126 142  Diastolic BP 87 91 84 92 102 72 67  Wt. (Lbs)  295   296.4  299  BMI  47.61 kg/m2   47.84 kg/m2  48.26 kg/m2

## 2024-02-22 NOTE — Assessment & Plan Note (Signed)
 On Crestor  40 mg daily, Zetia  10 mg daily Checking lipid panel

## 2024-02-22 NOTE — Progress Notes (Signed)
 Established Patient Office Visit  Subjective:  Patient ID: Nancy Pham, female    DOB: [birth year]  Age: 58 y.o. MRN: [id number]  CC:  Chief Complaint  Patient presents with   Hypertension    HPI Nancy Pham is a 58 y.o. female  has a past medical history of Asthma, Cancer (HCC), Chronic pain of right knee, Diabetes mellitus, Gout, History of cervical cancer, History of ovarian cancer, Hyperlipidemia, Hyperlipidemia LDL goal <70, Hypertension, and Obesity.  Patient presents for follow-up for her chronic medical conditions She reports taking her medications as ordered, she denies any adverse reactions to current medications  Patient complains of right elbow pain that started after she woke up yesterday, stated that she has had this problem in the past, not taking any medication    Past Medical History:  Diagnosis Date   Asthma    Cancer (HCC)    Chronic pain of right knee    Diabetes mellitus    Gout    History of cervical cancer    History of ovarian cancer    Hyperlipidemia    Hyperlipidemia LDL goal <70    Hypertension    Obesity     Past Surgical History:  Procedure Laterality Date   ABDOMINAL HYSTERECTOMY     BREAST BIOPSY Left 02/23/2023   MM LT BREAST BX W LOC DEV 1ST LESION IMAGE BX SPEC STEREO GUIDE 02/23/2023 [facility]   BREAST BIOPSY Right 02/23/2023   MM RT BREAST BX W LOC DEV 1ST LESION IMAGE BX SPEC STEREO GUIDE 02/23/2023 [facility]    Family History  Problem Relation Age of Onset   Seizures Mother    Heart attack Father    Heart attack Sister    Hypertension Sister    Hypertension Brother    Hypertension Brother    Hypertension Brother    Hypertension Brother    Hypertension Sister     Social History   Socioeconomic History   Marital status: Single    Spouse name: Not on file   Number of children: Not on file   Years of education: Not on file   Highest education level: Not on file  Occupational History   Not on file   Tobacco Use   Smoking status: Never   Smokeless tobacco: Never  Vaping Use   Vaping status: Never Used  Substance and Sexual Activity   Alcohol use: No   Drug use: No   Sexual activity: Not Currently  Other Topics Concern   Not on file  Social History Narrative   Not on file   Social Drivers of Health   Financial Resource Strain: Not on file  Food Insecurity: No Food Insecurity (01/10/2024)   Hunger Vital Sign    Worried About Running Out of Food in the Last Year: Never true    Ran Out of Food in the Last Year: Never true  Transportation Needs: No Transportation Needs (01/10/2024)   PRAPARE - Administrator, Civil Service (Medical): No    Lack of Transportation (Non-Medical): No  Physical Activity: Not on file  Stress: Not on file  Social Connections: Not on file  Intimate Partner Violence: Not on file    Outpatient Medications Prior to Visit  Medication Sig Dispense Refill   acetaminophen  (TYLENOL ) 325 MG tablet Take 325-650 mg by mouth every 6 (six) hours as needed for mild pain, fever or headache.      albuterol  (VENTOLIN  HFA) 108 (90 Base) MCG/ACT inhaler Inhale 1-2  puffs into the lungs every 6 (six) hours as needed for wheezing or shortness of breath. Please dispense 3 months supple. 8 g 11   allopurinol  (ZYLOPRIM ) 300 MG tablet Take 1 tablet (300 mg total) by mouth daily. 90 tablet 3   aspirin  EC 81 MG tablet Take 1 tablet (81 mg total) by mouth daily. 90 tablet 1   atenolol  (TENORMIN ) 100 MG tablet Take 1 tablet (100 mg total) by mouth daily. 90 tablet 1   Blood Glucose Monitoring Suppl DEVI Use to check up to twice a day. May substitute to any manufacturer covered by patient's insurance. 1 each 0   cetirizine (ZYRTEC) 10 MG tablet Take 10 mg by mouth daily.     Cholecalciferol  (VITAMIN D3) 125 MCG (5000 UT) CAPS Take 1 capsule (5,000 Units total) by mouth daily. 15 capsule 1   Dulaglutide  (TRULICITY ) 1.5 MG/0.5ML SOAJ Inject 1.5 mg into the skin once a  week. (Patient taking differently: Inject 1.5 mg into the skin once a week.) 2 mL 2   ezetimibe  (ZETIA ) 10 MG tablet Take 1 tablet (10 mg total) by mouth daily. 90 tablet 3   famotidine  (PEPCID ) 20 MG tablet Take 1 tablet (20 mg total) by mouth daily. 90 tablet 3   ferrous sulfate  325 (65 FE) MG tablet Take 1 tablet (325 mg total) by mouth daily with breakfast. 90 tablet 3   fluticasone  (FLONASE ) 50 MCG/ACT nasal spray Place 2 sprays into both nostrils daily as needed for allergies or rhinitis. 11.1 mL 11   gabapentin  (NEURONTIN ) 100 MG capsule Take 1 capsule (100 mg total) by mouth 3 (three) times daily. 270 capsule 3   glimepiride  (AMARYL ) 4 MG tablet Take 1 tablet (4 mg total) by mouth 2 (two) times daily. 180 tablet 1   Glucose Blood (BLOOD GLUCOSE TEST STRIPS) STRP Use to check up to twice a day. May substitute to any manufacturer covered by patient's insurance. 100 strip 3   hydrochlorothiazide  (HYDRODIURIL ) 12.5 MG tablet Take 1 tablet (12.5 mg total) by mouth daily. 90 tablet 1   Lancet Device MISC Use to check up to twice a day. May substitute to any manufacturer covered by patient's insurance. 1 each 0   Multiple Vitamins-Minerals (CENTRUM SILVER  50+WOMEN) TABS Take 1 tablet by mouth daily. 90 tablet 1   Omega-3 Fatty Acids (FISH OIL PO) Take 1 capsule by mouth daily.     Probiotic Product (PROBIOTIC DAILY PO) Take 1 capsule by mouth daily.     rosuvastatin  (CRESTOR ) 40 MG tablet Take 1 tablet (40 mg total) by mouth daily. 90 tablet 1   spironolactone  (ALDACTONE ) 25 MG tablet Take 0.5 tablets (12.5 mg total) by mouth daily. 90 tablet 1   triamcinolone  cream (KENALOG ) 0.1 % Apply 1 Application topically 2 (two) times daily. 30 g 0   meloxicam  (MOBIC ) 15 MG tablet Take 1 tablet (15 mg total) by mouth daily as needed for pain. 30 tablet 0   Lancets Misc. MISC Use to check up to twice a day. May substitute to any manufacturer covered by patient's insurance. (Patient not taking: Reported on  09/09/2023) 100 each 3   No facility-administered medications prior to visit.    Allergies  Allergen Reactions   Ace Inhibitors Swelling    Swelling of the lips   Tape Rash    No Band-Aids!!    ROS Review of Systems  Constitutional:  Negative for appetite change, chills, fatigue and fever.  HENT:  Negative for congestion, postnasal drip,  rhinorrhea and sneezing.   Respiratory:  Negative for cough, shortness of breath and wheezing.   Cardiovascular:  Negative for chest pain, palpitations and leg swelling.  Gastrointestinal:  Negative for abdominal pain, constipation, nausea and vomiting.  Genitourinary:  Negative for difficulty urinating, dysuria, flank pain and frequency.  Musculoskeletal:  Positive for arthralgias. Negative for joint swelling and myalgias.  Skin:  Negative for color change, pallor, rash and wound.  Neurological:  Negative for dizziness, facial asymmetry, weakness, numbness and headaches.  Psychiatric/Behavioral:  Negative for behavioral problems, confusion, self-injury and suicidal ideas.       Objective:    Physical Exam Vitals and nursing note reviewed.  Constitutional:      General: She is not in acute distress.    Appearance: Normal appearance. She is obese. She is not ill-appearing, toxic-appearing or diaphoretic.  HENT:     Mouth/Throat:     Mouth: Mucous membranes are moist.     Pharynx: Oropharynx is clear. No oropharyngeal exudate or posterior oropharyngeal erythema.  Eyes:     General: No scleral icterus.       Right eye: No discharge.        Left eye: No discharge.     Extraocular Movements: Extraocular movements intact.     Conjunctiva/sclera: Conjunctivae normal.  Cardiovascular:     Rate and Rhythm: Normal rate and regular rhythm.     Pulses: Normal pulses.     Heart sounds: Normal heart sounds. No murmur heard.    No friction rub. No gallop.  Pulmonary:     Effort: Pulmonary effort is normal. No respiratory distress.     Breath  sounds: Normal breath sounds. No stridor. No wheezing, rhonchi or rales.  Chest:     Chest wall: No tenderness.  Abdominal:     General: There is no distension.     Palpations: Abdomen is soft.     Tenderness: There is no abdominal tenderness. There is no right CVA tenderness, left CVA tenderness or guarding.  Musculoskeletal:        General: Tenderness present. No swelling, deformity or signs of injury.     Right lower leg: No edema.     Left lower leg: No edema.     Comments: Right elbow tenderness on palpation.  No redness or swelling noted, and is warm and dry  Skin:    General: Skin is warm and dry.     Capillary Refill: Capillary refill takes less than 2 seconds.     Coloration: Skin is not jaundiced or pale.     Findings: No bruising, erythema or lesion.  Neurological:     Mental Status: She is alert and oriented to person, place, and time.     Motor: No weakness.     Coordination: Coordination normal.     Gait: Gait normal.  Psychiatric:        Mood and Affect: Mood normal.        Behavior: Behavior normal.        Thought Content: Thought content normal.        Judgment: Judgment normal.     BP 139/87   Pulse 74   Temp (!) 96.8 F (36 C)   Wt 295 lb (133.8 kg)   SpO2 100%   BMI 47.61 kg/m  Wt Readings from Last 3 Encounters:  02/22/24 295 lb (133.8 kg)  01/10/24 296 lb 6.4 oz (134.4 kg)  09/09/23 299 lb (135.6 kg)    Lab Results  Component  Value Date   TSH 1.680 12/14/2022   Lab Results  Component Value Date   WBC 5.5 01/10/2024   HGB 13.9 01/10/2024   HCT 42.0 01/10/2024   MCV 88 01/10/2024   PLT 269 01/10/2024   Lab Results  Component Value Date   NA 135 01/10/2024   K 4.1 01/10/2024   CO2 22 01/10/2024   GLUCOSE 298 (H) 01/10/2024   BUN 10 01/10/2024   CREATININE 0.71 01/10/2024   BILITOT 1.0 01/08/2023   ALKPHOS 102 01/08/2023   AST 43 (H) 01/08/2023   ALT 32 01/08/2023   PROT 8.5 (H) 01/08/2023   ALBUMIN 3.2 (L) 01/08/2023   CALCIUM   9.3 01/10/2024   ANIONGAP 13 01/11/2023   EGFR 98 01/10/2024   Lab Results  Component Value Date   CHOL 139 04/08/2023   Lab Results  Component Value Date   HDL 47 04/08/2023   Lab Results  Component Value Date   LDLCALC 64 04/08/2023   Lab Results  Component Value Date   TRIG 163 (H) 04/08/2023   Lab Results  Component Value Date   CHOLHDL 3.0 04/08/2023   Lab Results  Component Value Date   HGBA1C 12.8 (A) 01/10/2024      Assessment & Plan:   Problem List Items Addressed This Visit       Cardiovascular and Mediastinum   Hypertension   Blood pressure has improved but not at goal of less than 130/80 Home blood pressure log reviewed today Currently on spironolactone  12.5 mg daily, hydrochlorothiazide  12.5 mg daily, atenolol  100 mg daily Checking BMP, will increase hydrochlorothiazide  to 25 mg daily if kidney function is stable DASH diet and commitment to daily physical activity for a minimum of 30 minutes discussed and encouraged, as a part of hypertension management. Appreciate collaboration with the clinical pharmacist     02/22/2024    4:09 PM 02/22/2024    3:58 PM 01/10/2024    3:43 PM 01/10/2024    2:20 PM 01/10/2024    2:03 PM 09/09/2023    2:25 PM 09/09/2023    2:12 PM  BP/Weight  Systolic BP 139 141 151 218 229 126 142  Diastolic BP 87 91 84 92 102 72 67  Wt. (Lbs)  295   296.4  299  BMI  47.61 kg/m2   47.84 kg/m2  48.26 kg/m2             Endocrine   Uncontrolled type 2 diabetes mellitus with hyperglycemia (HCC)   Current on Trulicity  0.75 milligrams once weeklyAmaryl 4 mg twice daily, will start Trulicity  1.5 mg in 2 weeks,  Continue current medications She does not exercise, stated that her diet can be better Not monitoring blood sugar Patient counseled on low-carb diet, encouraged to engage in regular moderate exercises at least 150 minutes weekly as tolerated Appreciate collaboration with the clinical pharmacist      Relevant Orders    Lipid panel   Basic Metabolic Panel     Other   Dyslipidemia, goal LDL below 70   On Crestor  40 mg daily, Zetia  10 mg daily Checking lipid panel       Chronic pain of right knee   Relevant Medications   meloxicam  (MOBIC ) 15 MG tablet   Right elbow pain - Primary   Toradol  30 mg injection given in the office today Take meloxicam  15 mg daily as needed from tomorrow, application of heat encouraged      Relevant Medications   meloxicam  (MOBIC ) 15 MG  tablet    Meds ordered this encounter  Medications   meloxicam  (MOBIC ) 15 MG tablet    Sig: Take 1 tablet (15 mg total) by mouth daily as needed for pain.    Dispense:  30 tablet    Refill:  0   ketorolac  (TORADOL ) 30 MG/ML injection 30 mg    Follow-up: Return in about 3 months (around 05/24/2024) for HTN, DM, FASTING LABS THIS WEEK.    Delva Derden R Meera Vasco, FNP

## 2024-02-22 NOTE — Patient Instructions (Signed)
 Around 3 times per week, check your blood pressure 2 times per day. once in the morning and once in the evening. The readings should be at least one minute apart. Write down these values and bring them to your next nurse visit/appointment.  When you check your BP, make sure you have been doing something calm/relaxing 5 minutes prior to checking. Both feet should be flat on the floor and you should be sitting. Use your left arm and make sure it is in a relaxed position (on a table), and that the cuff is at the approximate level/height of your heart.  Blood pressure goal is less than 130/80  Please get your fasting labs done this week, we will be making changes to your blood pressure medications once we have your lab results back  You were given Toradol  30 mg one-time dose for your elbow pain, from tomorrow take meloxicam  15 mg as needed.  I encourage application of heat   It is important that you exercise regularly at least 30 minutes 5 times a week as tolerated  Think about what you will eat, plan ahead. Choose  clean, green, fresh or frozen over canned, processed or packaged foods which are more sugary, salty and fatty. 70 to 75% of food eaten should be vegetables and fruit. Three meals at set times with snacks allowed between meals, but they must be fruit or vegetables. Aim to eat over a 12 hour period , example 7 am to 7 pm, and STOP after  your last meal of the day. Drink water,generally about 64 ounces per day, no other drink is as healthy. Fruit juice is best enjoyed in a healthy way, by EATING the fruit.  Thanks for choosing [facility] we consider it a privelige to serve you.

## 2024-02-22 NOTE — Assessment & Plan Note (Signed)
 Toradol  30 mg injection given in the office today Take meloxicam  15 mg daily as needed from tomorrow, application of heat encouraged

## 2024-02-22 NOTE — Assessment & Plan Note (Signed)
 Current on Trulicity  0.75 milligrams once weeklyAmaryl 4 mg twice daily, will start Trulicity  1.5 mg in 2 weeks,  Continue current medications She does not exercise, stated that her diet can be better Not monitoring blood sugar Patient counseled on low-carb diet, encouraged to engage in regular moderate exercises at least 150 minutes weekly as tolerated Appreciate collaboration with the clinical pharmacist

## 2024-02-23 ENCOUNTER — Other Ambulatory Visit: Payer: Self-pay

## 2024-02-23 DIAGNOSIS — E1165 Type 2 diabetes mellitus with hyperglycemia: Secondary | ICD-10-CM

## 2024-02-24 ENCOUNTER — Ambulatory Visit: Payer: Self-pay | Admitting: Nurse Practitioner

## 2024-02-24 DIAGNOSIS — I10 Essential (primary) hypertension: Secondary | ICD-10-CM

## 2024-02-24 DIAGNOSIS — E781 Pure hyperglyceridemia: Secondary | ICD-10-CM

## 2024-02-24 LAB — BASIC METABOLIC PANEL WITH GFR
BUN/Creatinine Ratio: 24 — ABNORMAL HIGH (ref 9–23)
BUN: 19 mg/dL (ref 6–24)
CO2: 22 mmol/L (ref 20–29)
Calcium: 10.1 mg/dL (ref 8.7–10.2)
Chloride: 98 mmol/L (ref 96–106)
Creatinine, Ser: 0.78 mg/dL (ref 0.57–1.00)
Glucose: 142 mg/dL — ABNORMAL HIGH (ref 70–99)
Potassium: 4.2 mmol/L (ref 3.5–5.2)
Sodium: 138 mmol/L (ref 134–144)
eGFR: 87 mL/min/1.73 (ref >59)

## 2024-02-24 LAB — LIPID PANEL
Chol/HDL Ratio: 2.2 ratio (ref 0.0–4.4)
Cholesterol, Total: 108 mg/dL (ref 100–199)
HDL: 50 mg/dL (ref >39)
LDL Chol Calc (NIH): 28 mg/dL (ref 0–99)
Triglycerides: 185 mg/dL — ABNORMAL HIGH (ref 0–149)
VLDL Cholesterol Cal: 30 mg/dL (ref 5–40)

## 2024-02-24 MED ORDER — HYDROCHLOROTHIAZIDE 25 MG PO TABS: 25.0000 mg | ORAL_TABLET | Freq: Every day | ORAL | 0 refills | Status: DC

## 2024-02-24 MED ORDER — FENOFIBRATE 48 MG PO TABS: 48.0000 mg | ORAL_TABLET | Freq: Every day | ORAL | 2 refills | Status: DC

## 2024-03-08 ENCOUNTER — Other Ambulatory Visit: Payer: Self-pay

## 2024-03-09 ENCOUNTER — Other Ambulatory Visit: Payer: Self-pay

## 2024-03-20 ENCOUNTER — Other Ambulatory Visit (INDEPENDENT_AMBULATORY_CARE_PROVIDER_SITE_OTHER): Payer: Self-pay

## 2024-03-20 DIAGNOSIS — M25561 Pain in right knee: Secondary | ICD-10-CM

## 2024-03-20 DIAGNOSIS — G8929 Other chronic pain: Secondary | ICD-10-CM

## 2024-03-20 DIAGNOSIS — M25521 Pain in right elbow: Secondary | ICD-10-CM

## 2024-03-20 MED ORDER — MELOXICAM 15 MG PO TABS: 15.0000 mg | ORAL_TABLET | Freq: Every day | ORAL | 0 refills | Status: DC | PRN

## 2024-03-20 NOTE — Progress Notes (Unsigned)
 03/20/2024 Name: Dave Mannes MRN: [id number] DOB: [birth year]  Chief Complaint  Patient presents with   Diabetes   Hypertension    Lakeyia Surber is a 58 y.o. year old female who presented for a telephone visit.   They were referred to the pharmacist by their PCP for assistance in managing diabetes, hypertension, and hyperlipidemia. PMH includes asthma, HTN, T2DM, HLD, gout, BMI > 45.    Subjective: Patient was last seen by PCP, Lorice Shall, NP on 02/22/24. At this time, patient's BP was much improved at 139/87 mmHg since restarting her BP medications. Repeat BMP demonstrated stable Scr and K. She was instructed to increase hydrochlorothiazide  to 25 mg daily. She was also instructed to start fenofibrate  48 mg daily for TG of 185 mg/dL. She was last engaged by pharmacy via telephone on 02/15/24, at which time she reported she was taking her medications, but had not been monitoring her BP or BG. She was instructed to increase Trulicity  to 1.5 mg at next fill.   Today, patient reports doing well. She reports she has been taking her medications as prescribed but has not been monitoring her BG or BP. She has current supplies of all her medications and refills for her maintenance meds, but requests a refill for meloxicam  which helps her pain.   Care Team: Primary Care Provider: Paseda, Folashade R, FNP ; Next Scheduled Visit: 04/16/24  Medication Access/Adherence  Current Pharmacy:  [facility] [address] [place], [name] - [address] AT [facility] [place]. [name] [address] Phone: [phone] Fax: [phone]   Patient reports affordability concerns with their medications: Yes  - lost insurance and had not been taking her medications from February to June. She reports she has applied for Medicaid in the past but it has been several years. She used [facility] to pick up her medications at [facility] for [facility] in June and has successfully  picked up refills since then.  Patient reports access/transportation concerns to their pharmacy: No  - [facility] pharmacy is more convenient for her, but she is willing to go to [facility] for [facility] Trulicity   Patient reports adherence concerns with their medications:  Yes     Diabetes:  Current medications: glimepiride  4 mg BID, Trulicity  1.5 mg weekly (Fridays)  Medications tried in the past: metformin XR/IR - nausea and vomiting, Victoza  - did well, lost insurance and had to stop  Denies GI AE since increasing Trulicity  to 1.5 mg. Discussed increasing to 3 mg weekly at next fill, but patient prefers to stay on current dose since she will be going out of town in late September and does not want to have side effects during this trip.   Current glucose readings: has meter, but has not been checking.   Patient denies hypoglycemic s/sx including dizziness, shakiness, sweating. Patient denies hyperglycemic symptoms including polyuria, polydipsia, polyphagia, nocturia, neuropathy, blurred vision.  Current meal patterns: Has been difficult to cut out sweet foods. Feels like Trulicity  is helping. Tries to eat 3 meals every day.  Breakfast: grits (less than 1/2 cup) and eggs Lunch: sandwich Dinner: chicken, salad  Drinks: water only, occasional ginger ale or juice.  Current physical activity: walking back and forth at home, nothing intentional   Current medication access support: [facility] Trulicity  from [facility]  Hypertension:  Current medications: atenolol  100 mg daily, hydrochlorothiazide  25 mg daily, spironolactone  12.5 mg daily Medications previously tried: lip/tongue swelling  with lisinopril, amlodipine  - LE swelling, chlorthalidone  - hypokalemia   Patient has a validated, automated, upper arm home BP cuff - checked while on the phone today: She took her medications this AM 129/69 mmHg on the phone today  Patient denies hypotensive s/sx including dizziness,  lightheadedness.  Patient denies hypertensive symptoms including headache, chest pain, shortness of breath. Says she feels great.    Hyperlipidemia/ASCVD Risk Reduction  Current lipid lowering medications: rosuvastatin  40 mg daily  Antiplatelet regimen: aspirin  81 mg daily  ASCVD History: none Family History: MI in Father, MI in sister Risk Factors: T2DM, HTN, HLD   Clinical ASCVD: No  The ASCVD Risk score (Arnett DK, et al., 2019) failed to calculate for the following reasons:   The valid total cholesterol range is 130 to 320 mg/dL     Objective:  BP Readings from Last 3 Encounters:  02/22/24 139/87  01/10/24 (!) 151/84  09/09/23 126/72   Lab Results  Component Value Date   HGBA1C 12.8 (A) 01/10/2024   HGBA1C 5.9 (A) 06/27/2023   HGBA1C 8.9 (A) 03/25/2023    Lab Results  Component Value Date   CREATININE 0.78 02/23/2024   BUN 19 02/23/2024   NA 138 02/23/2024   K 4.2 02/23/2024   CL 98 02/23/2024   CO2 22 02/23/2024    Lab Results  Component Value Date   CHOL 108 02/23/2024   HDL 50 02/23/2024   LDLCALC 28 02/23/2024   LDLDIRECT 62 04/08/2023   TRIG 185 (H) 02/23/2024   CHOLHDL 2.2 02/23/2024    Medications Reviewed Today     Reviewed by Brinda Lorain SQUIBB, RPH (Pharmacist) on 03/20/24 at 1744  Med List Status: <None>   Medication Order Taking? Sig Documenting Provider Last Dose Status Informant  acetaminophen  (TYLENOL ) 325 MG tablet [id number] Yes Take 325-650 mg by mouth every 6 (six) hours as needed for mild pain, fever or headache.  [provider]  Active Self           Med Note BUZZ, DOROTHYANN ONEIDA Schaumann Dec 23, 2022 10:24 AM)    albuterol  (VENTOLIN  HFA) 108 [id number] Base) MCG/ACT inhaler [id number] Yes Inhale 1-2 puffs into the lungs every 6 (six) hours as needed for wheezing or shortness of breath. Please dispense 3 months supple. Stroud, Natalie M, FNP  Active            Med Note SAMULE, [facility]   Fri Feb 25, 2023 10:56 AM) Prn   allopurinol   (ZYLOPRIM ) 300 MG tablet [id number] Yes Take 1 tablet (300 mg total) by mouth daily. Paseda, Folashade R, FNP  Active   aspirin  EC 81 MG tablet [id number] Yes Take 1 tablet (81 mg total) by mouth daily. Stroud, Natalie M, FNP  Active   atenolol  (TENORMIN ) 100 MG tablet [id number] Yes Take 1 tablet (100 mg total) by mouth daily. Paseda, Folashade R, FNP  Active   Blood Glucose Monitoring Suppl DEVI [id number]  Use to check up to twice a day. May substitute to any manufacturer covered by patient's insurance. Paseda, Folashade R, FNP  Active   cetirizine (ZYRTEC) 10 MG tablet [id number]  Take 10 mg by mouth daily. [provider]  Active   Cholecalciferol  (VITAMIN D3) 125 MCG (5000 UT) CAPS [id number]  Take 1 capsule (5,000 Units total) by mouth daily. Stroud, Natalie M, FNP  Active   Dulaglutide  (TRULICITY ) 1.5 MG/0.5ML EMMANUEL [id number] Yes Inject 1.5 mg into the skin once a week. Paseda, Folashade R, FNP  Active  ezetimibe  (ZETIA ) 10 MG tablet [id number] Yes Take 1 tablet (10 mg total) by mouth daily. Paseda, Folashade R, FNP  Active   famotidine  (PEPCID ) 20 MG tablet [id number]  Take 1 tablet (20 mg total) by mouth daily. Myrna Camelia HERO, NP  Active   fenofibrate  (TRICOR ) 48 MG tablet [id number] Yes Take 1 tablet (48 mg total) by mouth daily. Paseda, Folashade R, FNP  Active   ferrous sulfate  325 (65 FE) MG tablet [id number]  Take 1 tablet (325 mg total) by mouth daily with breakfast. Stroud, Natalie M, FNP  Active   fluticasone  (FLONASE ) 50 MCG/ACT nasal spray [id number]  Place 2 sprays into both nostrils daily as needed for allergies or rhinitis. Stroud, Natalie M, FNP  Active            Med Note BUZZ, DOROTHYANN ONEIDA Schaumann Dec 23, 2022 10:24 AM)    gabapentin  (NEURONTIN ) 100 MG capsule [id number] Yes Take 1 capsule (100 mg total) by mouth 3 (three) times daily. Paseda, Folashade R, FNP  Active   glimepiride  (AMARYL ) 4 MG tablet [id number] Yes Take 1 tablet (4 mg total) by mouth 2 (two) times daily.  Paseda, Folashade R, FNP  Active   Glucose Blood (BLOOD GLUCOSE TEST STRIPS) STRP [id number]  Use to check up to twice a day. May substitute to any manufacturer covered by patient's insurance. Paseda, Folashade R, FNP  Active   hydrochlorothiazide  (HYDRODIURIL ) 25 MG tablet [id number] Yes Take 1 tablet (25 mg total) by mouth daily. Paseda, Folashade R, FNP  Active   Lancet Device MISC [id number]  Use to check up to twice a day. May substitute to any manufacturer covered by patient's insurance. Paseda, Folashade R, FNP  Active   Lancets Misc. MISC [id number]  Use to check up to twice a day. May substitute to any manufacturer covered by patient's insurance.  Patient not taking: Reported on 09/09/2023   Paseda, Folashade R, FNP  Active   meloxicam  (MOBIC ) 15 MG tablet [id number]  Take 1 tablet (15 mg total) by mouth daily as needed for pain. Paseda, Folashade R, FNP  Active   Multiple Vitamins-Minerals (CENTRUM SILVER  50+WOMEN) TABS [id number]  Take 1 tablet by mouth daily. Myrna Camelia HERO, NP  Active   Omega-3 Fatty Acids (FISH OIL PO) [id number]  Take 1 capsule by mouth daily. [provider]  Active   Probiotic Product (PROBIOTIC DAILY PO) [id number]  Take 1 capsule by mouth daily. [provider]  Active Self  rosuvastatin  (CRESTOR ) 40 MG tablet [id number] Yes Take 1 tablet (40 mg total) by mouth daily. Paseda, Folashade R, FNP  Active   spironolactone  (ALDACTONE ) 25 MG tablet [id number] Yes Take 0.5 tablets (12.5 mg total) by mouth daily. Paseda, Folashade R, FNP  Active   triamcinolone  cream (KENALOG ) 0.1 % [id number]  Apply 1 Application topically 2 (two) times daily. Paseda, Folashade R, FNP  Active               Assessment/Plan:   Diabetes: - Currently uncontrolled with most recent A1C of 12.8% above goal < 7% in the setting of not taking any medications for several months due to losing insurance and cost barriers. Anticipate  improvement in glycemic control since restarting  Trulicity  and glimepiride , but there is no SMBG to review. Patient does not appear willing to monitor her BG or BP. We will continue Trulicity  1.5 mg weekly for another month, as patient prefers to delay titration, then plan to increase to 3 mg weekly.  -  Reviewed long term cardiovascular and renal outcomes of uncontrolled blood sugar - Reviewed goal A1c, goal fasting, and goal 2 hour post prandial glucose - Reviewed hypoglycemia management plan and the rule of 15 - Reviewed dietary modifications including  utilizing the healthy plate method, limiting portion size of carbohydrate foods, increasing intake of protein and non-starchy vegetables. Counseled patient to stay hydrated with water throughout the day. - Reviewed lifestyle modifications including: aiming for 150 minutes of moderate intensity exercise every week.  - Recommend to continue Trulicity  1.5 mg weekly - Recommend to continue glimepiride  4 mg BID with meals.  - Patient denies personal or family history of multiple endocrine neoplasia type 2, medullary thyroid  cancer; personal history of pancreatitis or gallbladder disease. - Recommend to check glucose once daily fasting - Next A1C due Sept 2025   Hypertension: - Currently controlled during phone call today, and much improved compared to previous telephone and PCP visits. Again, counseled patient on the importance of daily adherence to her BP medications. Additionally emphasized importance of monitoring. She is unable to take ACEi/ARB due to a hx of angioedema and had issues with LE swelling with amlodipine  in the past. She is tolerating the higher dose of hydrochlorothiazide  well.  In the future, could consider switching atenolol  to carvedilol and/or titrating spironolactone  at follow-up if additional blood pressure control is needed. - Reviewed long term cardiovascular and renal outcomes of uncontrolled blood pressure - Reviewed appropriate blood pressure monitoring technique and  reviewed goal blood pressure. Recommended to check home blood pressure and heart rate once daily every day. She should bring log to all her appointments. - Recommend to continue atenolol  100 mg daily, hydrochlorothiazide  25 mg daily, spironolactone  12.5 mg daily  - Repeat BMET at next PCP appointment after increase in HCTZ   Hyperlipidemia/ASCVD Risk Reduction: - Currently controlled with last LDL-C 62 mg/dL below goal < 70 mg/dL. High intensity statin was resumed in June 2025. Patient was initiated on fenofibrate  for elevated triglycerides - however, fibrate is not indicated unless TG > 500 mg/dL. Anticipate TG to improve with glycemic control. Will discuss with PCP. - Reviewed long term complications of uncontrolled cholesterol - Recommend to continue rosuvastatin  40 mg daily  - Will discuss discontinuing fenofibrate  with PCP - Repeat lipid panel in 6-12 weeks   Follow Up Plan: PCP 04/16/24, PharmD telephone 05/15/24   Lorain Baseman, PharmD [facility] [phone]

## 2024-04-04 ENCOUNTER — Other Ambulatory Visit: Payer: Self-pay

## 2024-04-10 ENCOUNTER — Other Ambulatory Visit: Payer: Self-pay

## 2024-04-13 ENCOUNTER — Other Ambulatory Visit: Payer: Self-pay

## 2024-04-16 ENCOUNTER — Encounter: Payer: Self-pay | Admitting: Nurse Practitioner

## 2024-04-16 ENCOUNTER — Ambulatory Visit (INDEPENDENT_AMBULATORY_CARE_PROVIDER_SITE_OTHER): Payer: Self-pay | Admitting: Nurse Practitioner

## 2024-04-16 VITALS — BP 130/81 | HR 79 | Wt 302.0 lb

## 2024-04-16 DIAGNOSIS — Z6841 Body Mass Index (BMI) 40.0 and over, adult: Secondary | ICD-10-CM

## 2024-04-16 DIAGNOSIS — E785 Hyperlipidemia, unspecified: Secondary | ICD-10-CM

## 2024-04-16 DIAGNOSIS — G8929 Other chronic pain: Secondary | ICD-10-CM

## 2024-04-16 DIAGNOSIS — M5441 Lumbago with sciatica, right side: Secondary | ICD-10-CM

## 2024-04-16 DIAGNOSIS — E66813 Obesity, class 3: Secondary | ICD-10-CM

## 2024-04-16 DIAGNOSIS — I10 Essential (primary) hypertension: Secondary | ICD-10-CM

## 2024-04-16 DIAGNOSIS — M5442 Lumbago with sciatica, left side: Secondary | ICD-10-CM

## 2024-04-16 DIAGNOSIS — E1165 Type 2 diabetes mellitus with hyperglycemia: Secondary | ICD-10-CM

## 2024-04-16 LAB — POCT GLYCOSYLATED HEMOGLOBIN (HGB A1C): Hemoglobin A1C: 10.1 % — AB (ref 4.0–5.6)

## 2024-04-16 MED ORDER — TRULICITY 3 MG/0.5ML ~~LOC~~ SOAJ: 3.0000 mg | SUBCUTANEOUS | 0 refills | Status: DC

## 2024-04-16 NOTE — Progress Notes (Signed)
 Established Patient Office Visit  Subjective:  Patient ID: Nancy Pham, female    DOB: [birth year]  Age: 58 y.o. MRN: [id number]  CC:  Chief Complaint  Patient presents with   Diabetes    HPI   Discussed the use of AI scribe software for clinical note transcription with the patient, who gave verbal consent to proceed.  History of Present Illness Nancy Pham is a 58 year old female with hypertension and diabetes who presents for medication management and follow-up.  She is managing her hypertension with atenolol  100 mg daily, hydrochlorothiazide  25 mg daily, and spironolactone  12.5 mg daily. Her most recent blood pressure reading was 130/81 mmHg. She does not regularly check her blood pressure at home but plans to start recording it in a logbook.  For diabetes management, she is taking  Trulicity  1.5 mg once a week, and glimepiride  4 mg twice a day. She has been on the current dose of Trulicity  for about one to two months and has three doses remaining at home. Her A1c has improved from 12.8% to 10.1%. She is receiving patient assistance for her medications and is trying to control her blood sugar by reducing her intake of sweets and staying active by walking with her sister daily.  She experiences lower back pain located in the middle to lower back. The pain does not worsen with pressure but increases when standing or walking for long periods. She takes Tylenol  and meloxicam  for pain management and uses a lidocaine patch, especially when traveling.  No chest pain, shortness of breath, nausea, vomiting, abdominal pain, or dizziness. She is planning to travel to [place]  and will be babysitting four dogs and her sister during her stay.    Assessment & Plan     Past Medical History:  Diagnosis Date   Asthma    Cancer (HCC)    Chronic pain of right knee    Diabetes mellitus    Gout    History of cervical cancer    History of ovarian cancer    Hyperlipidemia     Hyperlipidemia LDL goal <70    Hypertension    Obesity     Past Surgical History:  Procedure Laterality Date   ABDOMINAL HYSTERECTOMY     BREAST BIOPSY Left 02/23/2023   MM LT BREAST BX W LOC DEV 1ST LESION IMAGE BX SPEC STEREO GUIDE 02/23/2023 [facility]   BREAST BIOPSY Right 02/23/2023   MM RT BREAST BX W LOC DEV 1ST LESION IMAGE BX SPEC STEREO GUIDE 02/23/2023 [facility]    Family History  Problem Relation Age of Onset   Seizures Mother    Heart attack Father    Heart attack Sister    Hypertension Sister    Hypertension Brother    Hypertension Brother    Hypertension Brother    Hypertension Brother    Hypertension Sister     Social History   Socioeconomic History   Marital status: Single    Spouse name: Not on file   Number of children: Not on file   Years of education: Not on file   Highest education level: Not on file  Occupational History   Not on file  Tobacco Use   Smoking status: Never   Smokeless tobacco: Never  Vaping Use   Vaping status: Never Used  Substance and Sexual Activity   Alcohol use: No   Drug use: No   Sexual activity: Not Currently  Other Topics Concern   Not on file  Social History Narrative   Not on file   Social Drivers of Health   Financial Resource Strain: Not on file  Food Insecurity: No Food Insecurity (01/10/2024)   Hunger Vital Sign    Worried About Running Out of Food in the Last Year: Never true    Ran Out of Food in the Last Year: Never true  Transportation Needs: No Transportation Needs (01/10/2024)   PRAPARE - Administrator, Civil Service (Medical): No    Lack of Transportation (Non-Medical): No  Physical Activity: Not on file  Stress: Not on file  Social Connections: Not on file  Intimate Partner Violence: Not on file    Outpatient Medications Prior to Visit  Medication Sig Dispense Refill   acetaminophen  (TYLENOL ) 325 MG tablet Take 325-650 mg by mouth every 6 (six) hours as needed for  mild pain, fever or headache.      albuterol  (VENTOLIN  HFA) 108 (90 Base) MCG/ACT inhaler Inhale 1-2 puffs into the lungs every 6 (six) hours as needed for wheezing or shortness of breath. Please dispense 3 months supple. 8 g 11   allopurinol  (ZYLOPRIM ) 300 MG tablet Take 1 tablet (300 mg total) by mouth daily. 90 tablet 3   aspirin  EC 81 MG tablet Take 1 tablet (81 mg total) by mouth daily. 90 tablet 1   atenolol  (TENORMIN ) 100 MG tablet Take 1 tablet (100 mg total) by mouth daily. 90 tablet 1   Blood Glucose Monitoring Suppl DEVI Use to check up to twice a day. May substitute to any manufacturer covered by patient's insurance. 1 each 0   cetirizine (ZYRTEC) 10 MG tablet Take 10 mg by mouth daily.     Cholecalciferol  (VITAMIN D3) 125 MCG (5000 UT) CAPS Take 1 capsule (5,000 Units total) by mouth daily. 15 capsule 1   Dulaglutide  (TRULICITY ) 1.5 MG/0.5ML SOAJ Inject 1.5 mg into the skin once a week. 2 mL 2   ezetimibe  (ZETIA ) 10 MG tablet Take 1 tablet (10 mg total) by mouth daily. 90 tablet 3   famotidine  (PEPCID ) 20 MG tablet Take 1 tablet (20 mg total) by mouth daily. 90 tablet 3   fenofibrate  (TRICOR ) 48 MG tablet Take 1 tablet (48 mg total) by mouth daily. 60 tablet 2   ferrous sulfate  325 (65 FE) MG tablet Take 1 tablet (325 mg total) by mouth daily with breakfast. 90 tablet 3   fluticasone  (FLONASE ) 50 MCG/ACT nasal spray Place 2 sprays into both nostrils daily as needed for allergies or rhinitis. 11.1 mL 11   gabapentin  (NEURONTIN ) 100 MG capsule Take 1 capsule (100 mg total) by mouth 3 (three) times daily. 270 capsule 3   glimepiride  (AMARYL ) 4 MG tablet Take 1 tablet (4 mg total) by mouth 2 (two) times daily. 180 tablet 1   Glucose Blood (BLOOD GLUCOSE TEST STRIPS) STRP Use to check up to twice a day. May substitute to any manufacturer covered by patient's insurance. 100 strip 3   hydrochlorothiazide  (HYDRODIURIL ) 25 MG tablet Take 1 tablet (25 mg total) by mouth daily. 90 tablet 0    Lancet Device MISC Use to check up to twice a day. May substitute to any manufacturer covered by patient's insurance. 1 each 0   meloxicam  (MOBIC ) 15 MG tablet Take 1 tablet (15 mg total) by mouth daily as needed for pain. 30 tablet 0   Multiple Vitamins-Minerals (CENTRUM SILVER  50+WOMEN) TABS Take 1 tablet by mouth daily. 90 tablet 1   Omega-3 Fatty Acids (FISH OIL  PO) Take 1 capsule by mouth daily.     Probiotic Product (PROBIOTIC DAILY PO) Take 1 capsule by mouth daily.     rosuvastatin  (CRESTOR ) 40 MG tablet Take 1 tablet (40 mg total) by mouth daily. 90 tablet 1   spironolactone  (ALDACTONE ) 25 MG tablet Take 0.5 tablets (12.5 mg total) by mouth daily. 90 tablet 1   triamcinolone  cream (KENALOG ) 0.1 % Apply 1 Application topically 2 (two) times daily. 30 g 0   Lancets Misc. MISC Use to check up to twice a day. May substitute to any manufacturer covered by patient's insurance. (Patient not taking: Reported on 09/09/2023) 100 each 3   No facility-administered medications prior to visit.    Allergies  Allergen Reactions   Ace Inhibitors Swelling    Swelling of the lips   Tape Rash    No Band-Aids!!    ROS Review of Systems  Constitutional:  Negative for appetite change, chills, fatigue and fever.  HENT:  Negative for congestion, postnasal drip, rhinorrhea and sneezing.   Respiratory:  Negative for cough, shortness of breath and wheezing.   Cardiovascular:  Negative for chest pain, palpitations and leg swelling.  Gastrointestinal:  Negative for abdominal pain, constipation, nausea and vomiting.  Genitourinary:  Negative for difficulty urinating, dysuria, flank pain and frequency.  Musculoskeletal:  Positive for back pain. Negative for joint swelling and myalgias.  Skin:  Negative for color change, pallor, rash and wound.  Neurological:  Negative for dizziness, facial asymmetry, weakness, numbness and headaches.  Psychiatric/Behavioral:  Negative for behavioral problems, confusion,  self-injury and suicidal ideas.       Objective:    Physical Exam Vitals and nursing note reviewed.  Constitutional:      General: She is not in acute distress.    Appearance: Normal appearance. She is obese. She is not ill-appearing, toxic-appearing or diaphoretic.  HENT:     Mouth/Throat:     Pharynx: No posterior oropharyngeal erythema.  Eyes:     General: No scleral icterus.       Right eye: No discharge.        Left eye: No discharge.     Extraocular Movements: Extraocular movements intact.     Conjunctiva/sclera: Conjunctivae normal.  Cardiovascular:     Rate and Rhythm: Normal rate and regular rhythm.     Pulses: Normal pulses.     Heart sounds: Normal heart sounds. No murmur heard.    No friction rub. No gallop.  Pulmonary:     Effort: Pulmonary effort is normal. No respiratory distress.     Breath sounds: Normal breath sounds. No stridor. No wheezing, rhonchi or rales.  Chest:     Chest wall: No tenderness.  Abdominal:     General: There is no distension.     Palpations: Abdomen is soft.     Tenderness: There is no abdominal tenderness. There is no right CVA tenderness, left CVA tenderness or guarding.  Musculoskeletal:        General: No swelling, tenderness, deformity or signs of injury.     Right lower leg: No edema.     Left lower leg: No edema.  Skin:    General: Skin is warm and dry.     Capillary Refill: Capillary refill takes less than 2 seconds.     Coloration: Skin is not jaundiced or pale.     Findings: No bruising, erythema or lesion.  Neurological:     Mental Status: She is alert and oriented to person, place,  and time.     Motor: No weakness.     Gait: Gait normal.  Psychiatric:        Mood and Affect: Mood normal.        Behavior: Behavior normal.        Thought Content: Thought content normal.        Judgment: Judgment normal.     BP 130/81   Pulse 79   Wt (!) 302 lb (137 kg)   SpO2 100%   BMI 48.74 kg/m  Wt Readings from Last 3  Encounters:  04/16/24 (!) 302 lb (137 kg)  02/22/24 295 lb (133.8 kg)  01/10/24 296 lb 6.4 oz (134.4 kg)    Lab Results  Component Value Date   TSH 1.680 12/14/2022   Lab Results  Component Value Date   WBC 5.5 01/10/2024   HGB 13.9 01/10/2024   HCT 42.0 01/10/2024   MCV 88 01/10/2024   PLT 269 01/10/2024   Lab Results  Component Value Date   NA 138 02/23/2024   K 4.2 02/23/2024   CO2 22 02/23/2024   GLUCOSE 142 (H) 02/23/2024   BUN 19 02/23/2024   CREATININE 0.78 02/23/2024   BILITOT 1.0 01/08/2023   ALKPHOS 102 01/08/2023   AST 43 (H) 01/08/2023   ALT 32 01/08/2023   PROT 8.5 (H) 01/08/2023   ALBUMIN 3.2 (L) 01/08/2023   CALCIUM  10.1 02/23/2024   ANIONGAP 13 01/11/2023   EGFR 87 02/23/2024   Lab Results  Component Value Date   CHOL 108 02/23/2024   Lab Results  Component Value Date   HDL 50 02/23/2024   Lab Results  Component Value Date   LDLCALC 28 02/23/2024   Lab Results  Component Value Date   TRIG 185 (H) 02/23/2024   Lab Results  Component Value Date   CHOLHDL 2.2 02/23/2024   Lab Results  Component Value Date   HGBA1C 10.1 (A) 04/16/2024      Assessment & Plan:   Problem List Items Addressed This Visit       Cardiovascular and Mediastinum   Hypertension    Fairly controlled with blood pressure at 130/81. Goal is to maintain blood pressure less than 130/80. - Continue atenolol  100 mg daily, hydrochlorothiazide  25 mg daily, and spironolactone  12.5 mg daily as prescribed. - Reassess blood pressure control at next visit after travel. DASH diet and commitment to daily physical activity for a minimum of 30 minutes discussed and encouraged, as a part of hypertension management. The importance of attaining a healthy weight is also discussed.     04/16/2024    3:21 PM 04/16/2024    3:12 PM 02/22/2024    4:09 PM 02/22/2024    3:58 PM 01/10/2024    3:43 PM 01/10/2024    2:20 PM 01/10/2024    2:03 PM  BP/Weight  Systolic BP 130 139 139 141  151 218 229  Diastolic BP 81 79 87 91 84 92 897  Wt. (Lbs)  302  295   296.4  BMI  48.74 kg/m2  47.61 kg/m2   47.84 kg/m2             Endocrine   Uncontrolled type 2 diabetes mellitus with hyperglycemia (HCC) - Primary   Lab Results  Component Value Date   HGBA1C 10.1 (A) 04/16/2024   Type 2 diabetes mellitus with hyperglycemia A1c improved to 10.1 from 12.8 but remains above target. Goal is to reduce A1c to less than 7. - Increase Trulicity  to  3 mg weekly after current supply is finished. - Continue glimepiride  4 mg twice daily. - Emphasize lifestyle modifications including low carb diet and regular exercise. Appreciates collaboration with clinical pharmacist       Relevant Medications   Dulaglutide  (TRULICITY ) 3 MG/0.5ML SOAJ   Other Relevant Orders   POCT glycosylated hemoglobin (Hb A1C) (Completed)     Nervous and Auditory   Chronic bilateral low back pain with bilateral sciatica   Pain rated as 6/10, tolerable and managed with Tylenol  and meloxicam . - Continue Tylenol  and meloxicam  15 mg daily as needed for pain. - Use lidocaine patch for additional pain relief, especially during travel. - Ensure meloxicam  is taken with food to protect stomach.         Other   Dyslipidemia, goal LDL below 70   Lab Results  Component Value Date   CHOL 108 02/23/2024   HDL 50 02/23/2024   LDLCALC 28 02/23/2024   LDLDIRECT 62 04/08/2023   TRIG 185 (H) 02/23/2024   CHOLHDL 2.2 02/23/2024   Cholesterol levels were good a month ago. Triglyceride levels not significantly elevated. - Continue rosuvastatin  40 mg daily Zetia  10 mg daily as prescribed.   - Discontinue fenofibrate  after current supply is finished.         Class 3 severe obesity with serious comorbidity and body mass index (BMI) of 45.0 to 49.9 in adult   Wt Readings from Last 3 Encounters:  04/16/24 (!) 302 lb (137 kg)  02/22/24 295 lb (133.8 kg)  01/10/24 296 lb 6.4 oz (134.4 kg)   Body mass index is 48.74  kg/m.  Patient counseled on low-carb diet Encouraged regular moderate to vigorous exercise at least 150 minutes weekly as tolerated      Relevant Medications   Dulaglutide  (TRULICITY ) 3 MG/0.5ML SOAJ    Meds ordered this encounter  Medications   Dulaglutide  (TRULICITY ) 3 MG/0.5ML SOAJ    Sig: Inject 3 mg as directed once a week.    Dispense:  2 mL    Refill:  0    Follow-up: Return in about 3 months (around 07/16/2024) for DM.    Virna Livengood R Qiana Landgrebe, FNP

## 2024-04-16 NOTE — Assessment & Plan Note (Signed)
 Lab Results  Component Value Date   CHOL 108 02/23/2024   HDL 50 02/23/2024   LDLCALC 28 02/23/2024   LDLDIRECT 62 04/08/2023   TRIG 185 (H) 02/23/2024   CHOLHDL 2.2 02/23/2024   Cholesterol levels were good a month ago. Triglyceride levels not significantly elevated. - Continue rosuvastatin  40 mg daily Zetia  10 mg daily as prescribed.   - Discontinue fenofibrate  after current supply is finished.

## 2024-04-16 NOTE — Assessment & Plan Note (Signed)
 Wt Readings from Last 3 Encounters:  04/16/24 (!) 302 lb (137 kg)  02/22/24 295 lb (133.8 kg)  01/10/24 296 lb 6.4 oz (134.4 kg)   Body mass index is 48.74 kg/m.  Patient counseled on low-carb diet Encouraged regular moderate to vigorous exercise at least 150 minutes weekly as tolerated

## 2024-04-16 NOTE — Patient Instructions (Signed)
 Around 3 times per week, check your blood pressure 2 times per day. once in the morning and once in the evening. The readings should be at least one minute apart. Write down these values and bring them to your next nurse visit/appointment.  When you check your BP, make sure you have been doing something calm/relaxing 5 minutes prior to checking. Both feet should be flat on the floor and you should be sitting. Use your left arm and make sure it is in a relaxed position (on a table), and that the cuff is at the approximate level/height of your heart.   1. Uncontrolled type 2 diabetes mellitus with hyperglycemia (HCC) (Primary)  - POCT glycosylated hemoglobin (Hb A1C) - Dulaglutide  (TRULICITY ) 3 MG/0.5ML SOAJ; Inject 3 mg as directed once a week.  Dispense: 2 mL; Refill: 0   Goal for fasting blood sugar ranges from 80 to 120 and 2 hours after any meal or at bedtime should be between 130 to 170.   It is important that you exercise regularly at least 30 minutes 5 times a week as tolerated  Think about what you will eat, plan ahead. Choose  clean, green, fresh or frozen over canned, processed or packaged foods which are more sugary, salty and fatty. 70 to 75% of food eaten should be vegetables and fruit. Three meals at set times with snacks allowed between meals, but they must be fruit or vegetables. Aim to eat over a 12 hour period , example 7 am to 7 pm, and STOP after  your last meal of the day. Drink water,generally about 64 ounces per day, no other drink is as healthy. Fruit juice is best enjoyed in a healthy way, by EATING the fruit.  Thanks for choosing [facility] we consider it a privelige to serve you.

## 2024-04-16 NOTE — Assessment & Plan Note (Signed)
 Lab Results  Component Value Date   HGBA1C 10.1 (A) 04/16/2024   Type 2 diabetes mellitus with hyperglycemia A1c improved to 10.1 from 12.8 but remains above target. Goal is to reduce A1c to less than 7. - Increase Trulicity  to 3 mg weekly after current supply is finished. - Continue glimepiride  4 mg twice daily. - Emphasize lifestyle modifications including low carb diet and regular exercise. Appreciates collaboration with clinical pharmacist

## 2024-04-16 NOTE — Assessment & Plan Note (Addendum)
  Fairly controlled with blood pressure at 130/81. Goal is to maintain blood pressure less than 130/80. - Continue atenolol  100 mg daily, hydrochlorothiazide  25 mg daily, and spironolactone  12.5 mg daily as prescribed. - Reassess blood pressure control at next visit after travel. DASH diet and commitment to daily physical activity for a minimum of 30 minutes discussed and encouraged, as a part of hypertension management. The importance of attaining a healthy weight is also discussed.     04/16/2024    3:21 PM 04/16/2024    3:12 PM 02/22/2024    4:09 PM 02/22/2024    3:58 PM 01/10/2024    3:43 PM 01/10/2024    2:20 PM 01/10/2024    2:03 PM  BP/Weight  Systolic BP 130 139 139 141 151 218 229  Diastolic BP 81 79 87 91 84 92 897  Wt. (Lbs)  302  295   296.4  BMI  48.74 kg/m2  47.61 kg/m2   47.84 kg/m2

## 2024-04-16 NOTE — Assessment & Plan Note (Signed)
 Pain rated as 6/10, tolerable and managed with Tylenol  and meloxicam . - Continue Tylenol  and meloxicam  15 mg daily as needed for pain. - Use lidocaine patch for additional pain relief, especially during travel. - Ensure meloxicam  is taken with food to protect stomach.

## 2024-05-15 ENCOUNTER — Other Ambulatory Visit: Payer: Self-pay

## 2024-05-15 ENCOUNTER — Telehealth: Payer: Self-pay

## 2024-05-15 NOTE — Progress Notes (Deleted)
 05/15/2024 Name: Nancy Pham MRN: [id number] DOB: 1966-05-25  No chief complaint on file.   Nancy Pham is a 58 y.o. year old female who presented for a telephone visit.   They were referred to the pharmacist by their PCP for assistance in managing diabetes, hypertension, and hyperlipidemia. PMH includes asthma, HTN, T2DM, HLD, gout, BMI > 45.    Subjective: Patient was seen by PCP, Lorice Shall, NP on 02/22/24. At this time, patient's BP was much improved at 139/87 mmHg since restarting her BP medications. Repeat BMP demonstrated stable Scr and K. She was instructed to increase hydrochlorothiazide  to 25 mg daily. She was also instructed to start fenofibrate  48 mg daily for TG of 185 mg/dL. She was engaged by pharmacy via telephone on 02/15/24, at which time she reported she was taking her medications, but had not been monitoring her BP or BG. She was instructed to increase Trulicity  to 1.5 mg at next fill. At pharmacy telephone call on 03/20/24, patient reported continued adherence. At PCP appt on 04/16/24, BP was 130/81 mmHg, A1C improved from 12.8% to 10.1%. She was instructed to increase Trulicity  to 3 mg weekly at next fill. On repeat lipid panel, TG were improved, LDL-C was at goal and patient was told she could discontinue fenofibrate .  Today, patient reports doing well. ***  Care Team: Primary Care Provider: Paseda, Folashade R, FNP ; Next Scheduled Visit: 04/16/24  Medication Access/Adherence  Current Pharmacy:  [facility] [address] [place], [name] - [address] [facility] AT [facility] [address] [place]. [name] [address] Phone: [phone] Fax: [phone]   Patient reports affordability concerns with their medications: Yes  - lost insurance and had not been taking her medications from February to June. She reports she has applied for Medicaid in the past but it has been several years. She used GoodRx to pick up her medications at  [facility] for 90ds in June and has successfully picked up refills since then.  Patient reports access/transportation concerns to their pharmacy: No  - [facility] pharmacy is more convenient for her, but she is willing to go to [facility] for [facility] Trulicity   Patient reports adherence concerns with their medications:  Yes     Diabetes:  Current medications: glimepiride  4 mg BID, Trulicity  1.5 mg weekly (Fridays)  Medications tried in the past: metformin XR/IR - nausea and vomiting, Victoza  - did well, lost insurance and had to stop  Denies GI AE since increasing Trulicity  to 1.5 mg. Discussed increasing to 3 mg weekly at next fill, but patient prefers to stay on current dose since she will be going out of town in late September and does not want to have side effects during this trip.   Current glucose readings: has meter, but has not been checking.   Patient denies hypoglycemic s/sx including dizziness, shakiness, sweating. Patient denies hyperglycemic symptoms including polyuria, polydipsia, polyphagia, nocturia, neuropathy, blurred vision.  Current meal patterns: Has been difficult to cut out sweet foods. Feels like Trulicity  is helping. Tries to eat 3 meals every day.  Breakfast: grits (less than 1/2 cup) and eggs Lunch: sandwich Dinner: chicken, salad  Drinks: water only, occasional ginger ale or juice.  Current physical activity: walking back and forth at home, nothing intentional   Current medication access support: [facility] Trulicity  from [facility]  Hypertension:  Current medications: atenolol  100 mg daily, hydrochlorothiazide  25 mg daily, spironolactone  12.5 mg daily Medications previously  tried: lip/tongue swelling with lisinopril, amlodipine  - LE swelling, chlorthalidone  - hypokalemia   Patient has a validated, automated, upper arm home BP cuff - checked while on the phone today: She took her medications this AM 129/69 mmHg on the phone today  Patient denies  hypotensive s/sx including dizziness, lightheadedness.  Patient denies hypertensive symptoms including headache, chest pain, shortness of breath. Says she feels great.    Hyperlipidemia/ASCVD Risk Reduction  Current lipid lowering medications: rosuvastatin  40 mg daily  Antiplatelet regimen: aspirin  81 mg daily  ASCVD History: none Family History: MI in Father, MI in sister Risk Factors: T2DM, HTN, HLD   Clinical ASCVD: No  The ASCVD Risk score (Arnett DK, et al., 2019) failed to calculate for the following reasons:   The valid total cholesterol range is 130 to 320 mg/dL     Objective:  BP Readings from Last 3 Encounters:  04/16/24 130/81  02/22/24 139/87  01/10/24 (!) 151/84   Lab Results  Component Value Date   HGBA1C 10.1 (A) 04/16/2024   HGBA1C 12.8 (A) 01/10/2024   HGBA1C 5.9 (A) 06/27/2023    Lab Results  Component Value Date   CREATININE 0.78 02/23/2024   BUN 19 02/23/2024   NA 138 02/23/2024   K 4.2 02/23/2024   CL 98 02/23/2024   CO2 22 02/23/2024    Lab Results  Component Value Date   CHOL 108 02/23/2024   HDL 50 02/23/2024   LDLCALC 28 02/23/2024   LDLDIRECT 62 04/08/2023   TRIG 185 (H) 02/23/2024   CHOLHDL 2.2 02/23/2024    Medications Reviewed Today   Medications were not reviewed in this encounter       Assessment/Plan:   Diabetes: - Currently uncontrolled with most recent A1C of 12.8% above goal < 7% in the setting of not taking any medications for several months due to losing insurance and cost barriers. Anticipate  improvement in glycemic control since restarting Trulicity  and glimepiride , but there is no SMBG to review. Patient does not appear willing to monitor her BG or BP. We will continue Trulicity  1.5 mg weekly for another month, as patient prefers to delay titration, then plan to increase to 3 mg weekly.  - Reviewed long term cardiovascular and renal outcomes of uncontrolled blood sugar - Reviewed goal A1c, goal fasting, and  goal 2 hour post prandial glucose - Reviewed hypoglycemia management plan and the rule of 15 - Reviewed dietary modifications including  utilizing the healthy plate method, limiting portion size of carbohydrate foods, increasing intake of protein and non-starchy vegetables. Counseled patient to stay hydrated with water throughout the day. - Reviewed lifestyle modifications including: aiming for 150 minutes of moderate intensity exercise every week.  - Recommend to continue Trulicity  1.5 mg weekly - Recommend to continue glimepiride  4 mg BID with meals.  - Patient denies personal or family history of multiple endocrine neoplasia type 2, medullary thyroid  cancer; personal history of pancreatitis or gallbladder disease. - Recommend to check glucose once daily fasting - Next A1C due Sept 2025   Hypertension: - Currently controlled during phone call today, and much improved compared to previous telephone and PCP visits. Again, counseled patient on the importance of daily adherence to her BP medications. Additionally emphasized importance of monitoring. She is unable to take ACEi/ARB due to a hx of angioedema and had issues with LE swelling with amlodipine  in the past. She is tolerating the higher dose of hydrochlorothiazide  well.  In the future, could consider switching atenolol  to carvedilol  and/or titrating spironolactone  at follow-up if additional blood pressure control is needed. - Reviewed long term cardiovascular and renal outcomes of uncontrolled blood pressure - Reviewed appropriate blood pressure monitoring technique and reviewed goal blood pressure. Recommended to check home blood pressure and heart rate once daily every day. She should bring log to all her appointments. - Recommend to continue atenolol  100 mg daily, hydrochlorothiazide  25 mg daily, spironolactone  12.5 mg daily  - Repeat BMET at next PCP appointment after increase in HCTZ   Hyperlipidemia/ASCVD Risk Reduction: - Currently  controlled with last LDL-C 62 mg/dL below goal < 70 mg/dL. High intensity statin was resumed in June 2025. Patient was initiated on fenofibrate  for elevated triglycerides - however, fibrate is not indicated unless TG > 500 mg/dL. Anticipate TG to improve with glycemic control. Will discuss with PCP. - Reviewed long term complications of uncontrolled cholesterol - Recommend to continue rosuvastatin  40 mg daily  - Will discuss discontinuing fenofibrate  with PCP - Repeat lipid panel in 6-12 weeks   Follow Up Plan: PCP 04/16/24, PharmD telephone 05/15/24   Lorain Baseman, PharmD [facility] [phone]

## 2024-05-15 NOTE — Telephone Encounter (Signed)
 Attempted to contact patient for scheduled appointment for medication management. Left HIPAA compliant message for patient to return my call at their convenience.   Lorain Baseman, PharmD [facility] [phone]

## 2024-05-18 ENCOUNTER — Other Ambulatory Visit: Payer: Self-pay

## 2024-05-25 ENCOUNTER — Ambulatory Visit: Payer: Self-pay | Admitting: Nurse Practitioner

## 2024-05-29 ENCOUNTER — Other Ambulatory Visit: Payer: Self-pay

## 2024-06-12 ENCOUNTER — Other Ambulatory Visit: Payer: Self-pay | Admitting: Nurse Practitioner

## 2024-06-12 DIAGNOSIS — I10 Essential (primary) hypertension: Secondary | ICD-10-CM

## 2024-06-13 ENCOUNTER — Telehealth (HOSPITAL_COMMUNITY): Payer: Self-pay

## 2024-06-13 NOTE — Telephone Encounter (Signed)
 allopurinol  (ZYLOPRIM ) 300 MG tablet [[id number]]  glimepiride  (AMARYL ) 4 MG tablet [[id number]]  meloxicam  (MOBIC ) 15 MG tablet [[id number]]

## 2024-06-13 NOTE — Telephone Encounter (Signed)
 rosuvastatin  (CRESTOR ) 40 MG tablet [[id number]]

## 2024-06-19 ENCOUNTER — Telehealth: Payer: Self-pay | Admitting: Nurse Practitioner

## 2024-06-19 ENCOUNTER — Other Ambulatory Visit: Payer: Self-pay | Admitting: Nurse Practitioner

## 2024-06-19 ENCOUNTER — Other Ambulatory Visit: Payer: Self-pay

## 2024-06-19 DIAGNOSIS — E1165 Type 2 diabetes mellitus with hyperglycemia: Secondary | ICD-10-CM

## 2024-06-19 DIAGNOSIS — M25521 Pain in right elbow: Secondary | ICD-10-CM

## 2024-06-19 DIAGNOSIS — M1A09X0 Idiopathic chronic gout, multiple sites, without tophus (tophi): Secondary | ICD-10-CM

## 2024-06-19 DIAGNOSIS — G8929 Other chronic pain: Secondary | ICD-10-CM

## 2024-06-19 MED ORDER — MELOXICAM 15 MG PO TABS: 15.0000 mg | ORAL_TABLET | Freq: Every day | ORAL | 0 refills | Status: AC | PRN

## 2024-06-19 MED ORDER — GLIMEPIRIDE 4 MG PO TABS: 4.0000 mg | ORAL_TABLET | Freq: Two times a day (BID) | ORAL | 1 refills | Status: AC

## 2024-06-19 MED ORDER — TRULICITY 3 MG/0.5ML ~~LOC~~ SOAJ
3.0000 mg | SUBCUTANEOUS | 0 refills | Status: DC
  Filled 2024-06-19: qty 2, 28d supply, fill #0

## 2024-06-19 MED ORDER — TRULICITY 3 MG/0.5ML ~~LOC~~ SOAJ
3.0000 mg | SUBCUTANEOUS | 0 refills | Status: DC
Start: 2024-06-19 — End: 2024-06-19

## 2024-06-19 MED ORDER — ALLOPURINOL 300 MG PO TABS: 300.0000 mg | ORAL_TABLET | Freq: Every day | ORAL | 3 refills | Status: AC

## 2024-06-19 NOTE — Telephone Encounter (Signed)
 Done [facility]

## 2024-06-19 NOTE — Telephone Encounter (Signed)
 Copied from CRM [id number]. Topic: Clinical - Medication Question >> Jun 19, 2024  2:21 PM Kevelyn M wrote: Reason for CRM: Patient is asking if she can get an increase for Dulaglutide  (TRULICITY ) 3 MG/0.5ML SOAJ.  Call back [phone]

## 2024-06-19 NOTE — Telephone Encounter (Signed)
 Sent to PCP ?

## 2024-06-19 NOTE — Telephone Encounter (Signed)
 Copied from CRM [id number]. Topic: Clinical - Prescription Issue >> Jun 19, 2024  2:13 PM Kevelyn M wrote: Reason for CRM: Patient is at the pharmacy. She is waiting on Dulaglutide  (TRULICITY ) 3 MG/0.5ML SOAJ. She does not want to have to go back to the pharmacy. Patient is asking to get this taken care of today.  Please advise which dose is being prescribed. Thank you.

## 2024-06-20 ENCOUNTER — Other Ambulatory Visit: Payer: Self-pay

## 2024-06-26 ENCOUNTER — Other Ambulatory Visit: Payer: Self-pay

## 2024-07-04 ENCOUNTER — Other Ambulatory Visit (INDEPENDENT_AMBULATORY_CARE_PROVIDER_SITE_OTHER): Payer: Self-pay

## 2024-07-04 ENCOUNTER — Other Ambulatory Visit: Payer: Self-pay

## 2024-07-04 DIAGNOSIS — E1165 Type 2 diabetes mellitus with hyperglycemia: Secondary | ICD-10-CM

## 2024-07-04 DIAGNOSIS — I10 Essential (primary) hypertension: Secondary | ICD-10-CM

## 2024-07-04 DIAGNOSIS — E785 Hyperlipidemia, unspecified: Secondary | ICD-10-CM

## 2024-07-04 MED ORDER — ATENOLOL 100 MG PO TABS: 100.0000 mg | ORAL_TABLET | Freq: Every day | ORAL | 1 refills | Status: AC

## 2024-07-04 MED ORDER — ROSUVASTATIN CALCIUM 40 MG PO TABS: 40.0000 mg | ORAL_TABLET | Freq: Every day | ORAL | 3 refills | Status: AC

## 2024-07-04 MED ORDER — TRULICITY 3 MG/0.5ML ~~LOC~~ SOAJ
3.0000 mg | SUBCUTANEOUS | 5 refills | Status: DC
  Filled 2024-07-04 – 2024-07-13 (×2): qty 2, 28d supply, fill #0

## 2024-07-04 NOTE — Progress Notes (Signed)
 07/04/2024 Name: Nancy Pham MRN: [id number] DOB: 1965-12-30  Chief Complaint  Patient presents with   Diabetes   Hypertension    Nancy Pham is a 58 y.o. year old female who presented for a telephone visit.   They were referred to the pharmacist by their PCP for assistance in managing diabetes, hypertension, and hyperlipidemia. PMH includes asthma, HTN, T2DM, HLD, gout, BMI > 45.    Subjective: Patient was last seen by PCP, Lorice Shall, NP on 04/16/24. A1C had improved from 12.8% to 10.1% after a few weeks on Trulicity . BP was much improved at 131/80 mmHg. She was instructed to increase Trulicity  to 3 mg weekly at next fill. She was advised to discontinue fenofibrate  after current supply is finished.  Today, patient reports that she picked up Trulicity  3 mg for the first time on 06/22/24, and took a dose this past Friday. Denies any worsened GI AE. She does not monitor BG or BP at home.  Care Team: Primary Care Provider: Paseda, Folashade R, FNP ; Next Scheduled Visit: 07/16/24 - confirms appt today  Medication Access/Adherence  Current Pharmacy:  [facility] [address] [name] [address] Phone: [phone] Fax: [phone]   Patient reports affordability concerns with their medications: Yes  - lost insurance and had not been taking her medications from February to June. She reports she has applied for Medicaid in the past but it has been several years. She used GoodRx to pick up her medications at [facility] and has successfully picked up refills since then.  Patient reports access/transportation concerns to their pharmacy: No  - [facility] pharmacy is more convenient for her, but she is willing to go to [facility] for [facility]   Patient reports adherence concerns with their medications:  Yes   - hx of nonadherence due to financial concerns and lack of communication   Diabetes:  Current  medications: glimepiride  4 mg BID, Trulicity  3 mg weekly (Fridays)  Medications tried in the past: metformin XR/IR - nausea and vomiting, Victoza  - did well, lost insurance and had to stop  Denies GI AE since increasing Trulicity  to 3 mg.   Current glucose readings: has meter, but has not been checking. Not willing to start checking despite multiple conversations.  Patient denies hypoglycemic s/sx including dizziness, shakiness, sweating. Patient denies hyperglycemic symptoms including polyuria, polydipsia, polyphagia, nocturia, neuropathy, blurred vision.  Current meal patterns: Has been difficult to cut out sweet foods. Feels like Trulicity  is helping. Tries to eat 3 meals every day.  Breakfast: grits (less than 1/2 cup) and eggs Lunch: sandwich Dinner: chicken, salad  Drinks: water only, occasional ginger ale or juice.  Current physical activity: walking back and forth at home, nothing intentional   Current medication access support: [facility] Trulicity  from [facility]  Hypertension:  Current medications: atenolol  100 mg daily, hydrochlorothiazide  25 mg daily, spironolactone  12.5 mg daily Medications previously tried: lip/tongue swelling with lisinopril, amlodipine  - LE swelling, chlorthalidone  - hypokalemia   Patient has a validated, automated, upper arm home BP cuff - has not checked recently. Controlled at last OV and during last pharmacy call.  Patient denies hypotensive s/sx including dizziness, lightheadedness.  Patient denies hypertensive symptoms including headache, chest pain, shortness of breath.   Hyperlipidemia/ASCVD Risk Reduction  Current lipid lowering medications: rosuvastatin  40 mg daily, ezetimibe  10 mg daily, confirms that she plans to stop taking fenofibrate  48 mg daily when current supply is finished.  Antiplatelet regimen: aspirin  81 mg daily  ASCVD History: none Family History: MI in Father, MI in sister Risk Factors: T2DM, HTN, HLD   Clinical ASCVD: No   The ASCVD Risk score (Arnett DK, et al., 2019) failed to calculate for the following reasons:   The valid total cholesterol range is 130 to 320 mg/dL     Objective:  BP Readings from Last 3 Encounters:  04/16/24 130/81  02/22/24 139/87  01/10/24 (!) 151/84   Lab Results  Component Value Date   HGBA1C 10.1 (A) 04/16/2024   HGBA1C 12.8 (A) 01/10/2024   HGBA1C 5.9 (A) 06/27/2023    Lab Results  Component Value Date   CREATININE 0.78 02/23/2024   BUN 19 02/23/2024   NA 138 02/23/2024   K 4.2 02/23/2024   CL 98 02/23/2024   CO2 22 02/23/2024    Lab Results  Component Value Date   CHOL 108 02/23/2024   HDL 50 02/23/2024   LDLCALC 28 02/23/2024   LDLDIRECT 62 04/08/2023   TRIG 185 (H) 02/23/2024   CHOLHDL 2.2 02/23/2024    Medications Reviewed Today     Reviewed by Brinda Lorain SQUIBB, RPH-CPP (Pharmacist) on 07/04/24 at 1038  Med List Status: <None>   Medication Order Taking? Sig Documenting Provider Last Dose Status Informant  acetaminophen  (TYLENOL ) 325 MG tablet [id number]  Take 325-650 mg by mouth every 6 (six) hours as needed for mild pain, fever or headache.  [provider]  Active Self           Med Note BUZZ, DOROTHYANN ONEIDA Schaumann Dec 23, 2022 10:24 AM)    albuterol  (VENTOLIN  HFA) 108 [phone] Base) MCG/ACT inhaler [id number]  Inhale 1-2 puffs into the lungs every 6 (six) hours as needed for wheezing or shortness of breath. Please dispense 3 months supple. Stroud, Natalie M, FNP  Active            Med Note SAMULE, [facility]   Fri Feb 25, 2023 10:56 AM) Prn   allopurinol  (ZYLOPRIM ) 300 MG tablet [id number]  Take 1 tablet (300 mg total) by mouth daily. Paseda, Folashade R, FNP  Active   aspirin  EC 81 MG tablet [id number]  Take 1 tablet (81 mg total) by mouth daily. Stroud, Natalie M, FNP  Active   atenolol  (TENORMIN ) 100 MG tablet [id number] Yes Take 1 tablet (100 mg total) by mouth daily. Paseda, Folashade R, FNP  Active   Blood Glucose Monitoring Suppl DEVI [id number]   Use to check up to twice a day. May substitute to any manufacturer covered by patient's insurance. Paseda, Folashade R, FNP  Active   cetirizine (ZYRTEC) 10 MG tablet [id number]  Take 10 mg by mouth daily. [provider]  Active   Cholecalciferol  (VITAMIN D3) 125 MCG (5000 UT) CAPS [id number]  Take 1 capsule (5,000 Units total) by mouth daily. Stroud, Natalie M, FNP  Active   Dulaglutide  (TRULICITY ) 3 MG/0.5ML EMMANUEL [id number] Yes Inject 3 mg as directed once a week. Paseda, Folashade R, FNP  Active   ezetimibe  (ZETIA ) 10 MG tablet [id number] Yes Take 1 tablet (10 mg total) by mouth daily. Paseda, Folashade R, FNP  Active   famotidine  (PEPCID ) 20 MG tablet [id number]  Take 1 tablet (20 mg total) by mouth daily. Myrna Camelia HERO, NP  Active     Discontinued 07/04/24 1022 (Change in therapy)   ferrous sulfate  325 (65 FE) MG tablet [id number]  Take 1 tablet (325 mg total) by mouth daily with breakfast. Joan,  Natalie M, FNP  Active   fluticasone  (FLONASE ) 50 MCG/ACT nasal spray [id number]  Place 2 sprays into both nostrils daily as needed for allergies or rhinitis. Stroud, Natalie M, FNP  Active            Med Note BUZZ, DOROTHYANN ONEIDA Schaumann Dec 23, 2022 10:24 AM)    gabapentin  (NEURONTIN ) 100 MG capsule [id number] Yes Take 1 capsule (100 mg total) by mouth 3 (three) times daily. Paseda, Folashade R, FNP  Active   glimepiride  (AMARYL ) 4 MG tablet [id number] Yes Take 1 tablet (4 mg total) by mouth 2 (two) times daily. Paseda, Folashade R, FNP  Active   Glucose Blood (BLOOD GLUCOSE TEST STRIPS) STRP [id number]  Use to check up to twice a day. May substitute to any manufacturer covered by patient's insurance. Paseda, Folashade R, FNP  Active   hydrochlorothiazide  (HYDRODIURIL ) 25 MG tablet [id number] Yes TAKE ONE TABLET BY MOUTH ONE TIME DAILY Paseda, Folashade R, FNP  Active   Lancet Device MISC [id number]  Use to check up to twice a day. May substitute to any manufacturer covered by patient's insurance.  Paseda, Folashade R, FNP  Active   Lancets Misc. MISC [id number]  Use to check up to twice a day. May substitute to any manufacturer covered by patient's insurance.  Patient not taking: Reported on 09/09/2023   Paseda, Folashade R, FNP  Active   meloxicam  (MOBIC ) 15 MG tablet [id number]  Take 1 tablet (15 mg total) by mouth daily as needed for pain. Paseda, Folashade R, FNP  Active   Multiple Vitamins-Minerals (CENTRUM SILVER  50+WOMEN) TABS [id number]  Take 1 tablet by mouth daily. Myrna Camelia HERO, NP  Active   Omega-3 Fatty Acids (FISH OIL PO) [id number]  Take 1 capsule by mouth daily. [provider]  Active   Probiotic Product (PROBIOTIC DAILY PO) [id number]  Take 1 capsule by mouth daily. [provider]  Active Self  rosuvastatin  (CRESTOR ) 40 MG tablet [id number] Yes Take 1 tablet (40 mg total) by mouth daily. Paseda, Folashade R, FNP  Active   spironolactone  (ALDACTONE ) 25 MG tablet [id number] Yes Take 0.5 tablets (12.5 mg total) by mouth daily. Paseda, Folashade R, FNP  Active   triamcinolone  cream (KENALOG ) 0.1 % [id number]  Apply 1 Application topically 2 (two) times daily. Paseda, Folashade R, FNP  Active               Assessment/Plan:   Diabetes: - Currently uncontrolled with most recent A1C of 10.1% above goal < 7%, but improved from 12.8% since restarting Trulicity  and glimepiride . She recently increased Trulicity  to 3 mg weekly, 1-2 weeks ago. May take some time for effect to show in A1C. She previously achieved glycemic control and A1C <7% on this dose of Trulicity . She is not having overt s/sx of hyperglycemia, so feel it is appropriate to continue this regimen for now without addition of basal insulin , though may be required in the future. Continued to encourage healthy diet choices to control blood sugar. Once A1C is closer to goal, would add SGLT2i for kidney protection given macroalbuminuria. - Last UACR June 2025 - 564 mg/g - Reviewed long term cardiovascular  and renal outcomes of uncontrolled blood sugar - Reviewed goal A1c, goal fasting, and goal 2 hour post prandial glucose - Reviewed hypoglycemia management plan and the rule of 15 - Reviewed dietary modifications including  utilizing the healthy plate method, limiting portion size of carbohydrate foods, increasing intake of protein and non-starchy vegetables. Counseled  patient to stay hydrated with water throughout the day. - Reviewed lifestyle modifications including: aiming for 150 minutes of moderate intensity exercise every week.  - Recommend to continue Trulicity  3 mg weekly - Recommend to continue glimepiride  4 mg BID with meals - Patient denies personal or family history of multiple endocrine neoplasia type 2, medullary thyroid  cancer; personal history of pancreatitis or gallbladder disease. - Recommend to check glucose once daily fasting - Next A1C due 07/16/24   Hypertension: - Currently controlled with recent home and clinic BP consistently below or at goal < 130/80 mmHg. Patient has not had repeat BMP since increasing dose of hydrochlorothiazide  to 25 mg daily. Will plan to monitor at PCP f/u.  She is unable to take ACEi/ARB due to a hx of angioedema and had issues with LE swelling with amlodipine  in the past. In the future, could consider switching atenolol  to carvedilol and/or titrating spironolactone  at follow-up if additional blood pressure control is needed. Fill hx shows she may not be consistently taking spironolactone , but patient states she has this medication. - Reviewed long term cardiovascular and renal outcomes of uncontrolled blood pressure - Reviewed appropriate blood pressure monitoring technique and reviewed goal blood pressure. Recommended to check home blood pressure and heart rate once daily every day. She should bring log to all her appointments. - Recommend to continue atenolol  100 mg daily, hydrochlorothiazide  25 mg daily, spironolactone  12.5 mg daily  - Repeat BMET  at next PCP appointment after increase in HCTZ   Hyperlipidemia/ASCVD Risk Reduction: - Currently controlled with last LDL-C 28 mg/dL below goal < 70 mg/dL. High intensity statin indicated. Patient tolerating well. Agree with discontinuing fenofibrate  due to it being unnecessary treatment with TG < 500 mg/dL. - Reviewed long term complications of uncontrolled cholesterol - Recommend to continue rosuvastatin  40 mg daily, ezetimibe  10 mg daily  Follow Up Plan: PCP 07/16/24, PharmD telephone 08/15/24   Lorain Baseman, PharmD [facility] [phone]

## 2024-07-13 ENCOUNTER — Other Ambulatory Visit: Payer: Self-pay

## 2024-07-16 ENCOUNTER — Other Ambulatory Visit: Payer: Self-pay

## 2024-07-16 ENCOUNTER — Encounter: Payer: Self-pay | Admitting: Nurse Practitioner

## 2024-07-16 ENCOUNTER — Ambulatory Visit: Payer: Self-pay | Admitting: Nurse Practitioner

## 2024-07-16 VITALS — BP 142/78 | HR 74 | Temp 97.3°F | Wt 310.0 lb

## 2024-07-16 DIAGNOSIS — I10 Essential (primary) hypertension: Secondary | ICD-10-CM

## 2024-07-16 DIAGNOSIS — G8929 Other chronic pain: Secondary | ICD-10-CM

## 2024-07-16 DIAGNOSIS — E1165 Type 2 diabetes mellitus with hyperglycemia: Secondary | ICD-10-CM

## 2024-07-16 DIAGNOSIS — M25561 Pain in right knee: Secondary | ICD-10-CM

## 2024-07-16 DIAGNOSIS — E785 Hyperlipidemia, unspecified: Secondary | ICD-10-CM

## 2024-07-16 DIAGNOSIS — G5793 Unspecified mononeuropathy of bilateral lower limbs: Secondary | ICD-10-CM | POA: Insufficient documentation

## 2024-07-16 LAB — POCT GLYCOSYLATED HEMOGLOBIN (HGB A1C): Hemoglobin A1C: 8.7 % — AB (ref 4.0–5.6)

## 2024-07-16 MED ORDER — TRULICITY 4.5 MG/0.5ML ~~LOC~~ SOAJ
4.5000 mg | SUBCUTANEOUS | 1 refills | Status: AC
  Filled 2024-07-16: qty 2, 28d supply, fill #0
  Filled 2024-08-15 (×2): qty 2, 28d supply, fill #1

## 2024-07-16 MED ORDER — SPIRONOLACTONE 25 MG PO TABS
25.0000 mg | ORAL_TABLET | Freq: Every day | ORAL | 1 refills | Status: AC
  Filled 2024-07-16: qty 90, 90d supply, fill #0

## 2024-07-16 NOTE — Assessment & Plan Note (Signed)
"   Educated on foot care and importance of checking feet for wounds "

## 2024-07-16 NOTE — Assessment & Plan Note (Signed)
 Wt Readings from Last 3 Encounters:  07/16/24 (!) 310 lb (140.6 kg)  04/16/24 (!) 302 lb (137 kg)  02/22/24 295 lb (133.8 kg)   Body mass index is 50.04 kg/m.   BMI remains high, necessitating weight loss to improve health and manage comorbidities. - Advised on weight loss strategies including dietary modifications and increased physical activity

## 2024-07-16 NOTE — Progress Notes (Signed)
 "  Established Patient Office Visit  Subjective:  Patient ID: Nancy Pham, female    DOB: September 22, 1965  Age: 58 y.o. MRN: [id number]  CC:  Chief Complaint  Patient presents with   Diabetes    HPI   Discussed the use of AI scribe software for clinical note transcription with the patient, who gave verbal consent to proceed.  History of Present Illness Nancy Pham is a 58 year old female  has a past medical history of Asthma, Cancer (HCC), Chronic pain of right knee, Diabetes mellitus, Gout, History of cervical cancer, History of ovarian cancer, Hyperlipidemia, Hyperlipidemia LDL goal <70, Hypertension, and Obesity.  who presents for follow-up of her blood sugar and blood pressure management.  Her A1c has improved to 8.7 from 10.1 three months ago. She is currently taking glimepiride  4 mg twice daily and Trulicity  3 mg, which she started on June 22, 2024. She has made dietary changes, avoiding sugar, sweet soda, bread, and pasta, and consuming only small portions of brown rice. Occasionally, she eats cookies and cakes but not frequently.  Blood pressure readings have been high, with a first check at 152/83 and a recheck at 142/78. At home, her reading was 137/78. She is currently taking atenolol  100 mg daily, hydrochlorothiazide  25 mg, and spironolactone  12.5 mg daily. she denies consuming a lot of salt.  No history of wounds on her feet but experiences occasional calf pain when standing or moving around a lot, which resolves with rest. No regular calf pain when walking. No chest pain or shortness of breath. She walks barefoot at home.    Assessment & Plan .   Past Medical History:  Diagnosis Date   Asthma    Cancer (HCC)    Chronic pain of right knee    Diabetes mellitus    Gout    History of cervical cancer    History of ovarian cancer    Hyperlipidemia    Hyperlipidemia LDL goal <70    Hypertension    Obesity     Past Surgical History:  Procedure Laterality Date    ABDOMINAL HYSTERECTOMY     BREAST BIOPSY Left 02/23/2023   MM LT BREAST BX W LOC DEV 1ST LESION IMAGE BX SPEC STEREO GUIDE 02/23/2023 [facility]   BREAST BIOPSY Right 02/23/2023   MM RT BREAST BX W LOC DEV 1ST LESION IMAGE BX SPEC STEREO GUIDE 02/23/2023 [facility]    Family History  Problem Relation Age of Onset   Seizures Mother    Heart attack Father    Heart attack Sister    Hypertension Sister    Hypertension Brother    Hypertension Brother    Hypertension Brother    Hypertension Brother    Hypertension Sister     Social History   Socioeconomic History   Marital status: Single    Spouse name: Not on file   Number of children: Not on file   Years of education: Not on file   Highest education level: Not on file  Occupational History   Not on file  Tobacco Use   Smoking status: Never   Smokeless tobacco: Never  Vaping Use   Vaping status: Never Used  Substance and Sexual Activity   Alcohol use: No   Drug use: No   Sexual activity: Not Currently  Other Topics Concern   Not on file  Social History Narrative   Not on file   Social Drivers of Health   Tobacco Use: Low Risk (07/16/2024)  Patient History    Smoking Tobacco Use: Never    Smokeless Tobacco Use: Never    Passive Exposure: Not on file  Financial Resource Strain: Not on file  Food Insecurity: No Food Insecurity (07/16/2024)   Epic    Worried About Programme Researcher, Broadcasting/film/video in the Last Year: Never true    Ran Out of Food in the Last Year: Never true  Transportation Needs: No Transportation Needs (07/16/2024)   Epic    Lack of Transportation (Medical): No    Lack of Transportation (Non-Medical): No  Physical Activity: Not on file  Stress: Not on file  Social Connections: Not on file  Intimate Partner Violence: Not At Risk (07/16/2024)   Epic    Fear of Current or Ex-Partner: No    Emotionally Abused: No    Physically Abused: No    Sexually Abused: No  Depression (PHQ2-9): Low Risk  (07/16/2024)   Depression (PHQ2-9)    PHQ-2 Score: 0  Alcohol Screen: Not on file  Housing: Low Risk (07/16/2024)   Epic    Unable to Pay for Housing in the Last Year: No    Number of Times Moved in the Last Year: 0    Homeless in the Last Year: No  Utilities: Not At Risk (07/16/2024)   Epic    Threatened with loss of utilities: No  Health Literacy: Not on file    Outpatient Medications Prior to Visit  Medication Sig Dispense Refill   acetaminophen  (TYLENOL ) 325 MG tablet Take 325-650 mg by mouth every 6 (six) hours as needed for mild pain, fever or headache.      albuterol  (VENTOLIN  HFA) 108 (90 Base) MCG/ACT inhaler Inhale 1-2 puffs into the lungs every 6 (six) hours as needed for wheezing or shortness of breath. Please dispense 3 months supple. 8 g 11   allopurinol  (ZYLOPRIM ) 300 MG tablet Take 1 tablet (300 mg total) by mouth daily. 90 tablet 3   aspirin  EC 81 MG tablet Take 1 tablet (81 mg total) by mouth daily. 90 tablet 1   atenolol  (TENORMIN ) 100 MG tablet Take 1 tablet (100 mg total) by mouth daily. 90 tablet 1   cetirizine (ZYRTEC) 10 MG tablet Take 10 mg by mouth daily.     Cholecalciferol  (VITAMIN D3) 125 MCG (5000 UT) CAPS Take 1 capsule (5,000 Units total) by mouth daily. 15 capsule 1   ezetimibe  (ZETIA ) 10 MG tablet Take 1 tablet (10 mg total) by mouth daily. 90 tablet 3   famotidine  (PEPCID ) 20 MG tablet Take 1 tablet (20 mg total) by mouth daily. 90 tablet 3   ferrous sulfate  325 (65 FE) MG tablet Take 1 tablet (325 mg total) by mouth daily with breakfast. 90 tablet 3   fluticasone  (FLONASE ) 50 MCG/ACT nasal spray Place 2 sprays into both nostrils daily as needed for allergies or rhinitis. 11.1 mL 11   gabapentin  (NEURONTIN ) 100 MG capsule Take 1 capsule (100 mg total) by mouth 3 (three) times daily. 270 capsule 3   glimepiride  (AMARYL ) 4 MG tablet Take 1 tablet (4 mg total) by mouth 2 (two) times daily. 180 tablet 1   hydrochlorothiazide  (HYDRODIURIL ) 25 MG tablet TAKE  ONE TABLET BY MOUTH ONE TIME DAILY 90 tablet 0   meloxicam  (MOBIC ) 15 MG tablet Take 1 tablet (15 mg total) by mouth daily as needed for pain. 30 tablet 0   Multiple Vitamins-Minerals (CENTRUM SILVER  50+WOMEN) TABS Take 1 tablet by mouth daily. 90 tablet 1   Omega-3  Fatty Acids (FISH OIL PO) Take 1 capsule by mouth daily.     Probiotic Product (PROBIOTIC DAILY PO) Take 1 capsule by mouth daily.     rosuvastatin  (CRESTOR ) 40 MG tablet Take 1 tablet (40 mg total) by mouth daily. 90 tablet 3   Dulaglutide  (TRULICITY ) 3 MG/0.5ML SOAJ Inject 3 mg as directed once a week. 2 mL 5   spironolactone  (ALDACTONE ) 25 MG tablet Take 0.5 tablets (12.5 mg total) by mouth daily. 90 tablet 1   Blood Glucose Monitoring Suppl DEVI Use to check up to twice a day. May substitute to any manufacturer covered by patient's insurance. (Patient not taking: Reported on 07/16/2024) 1 each 0   Glucose Blood (BLOOD GLUCOSE TEST STRIPS) STRP Use to check up to twice a day. May substitute to any manufacturer covered by patient's insurance. (Patient not taking: Reported on 07/16/2024) 100 strip 3   Lancet Device MISC Use to check up to twice a day. May substitute to any manufacturer covered by patient's insurance. (Patient not taking: Reported on 07/16/2024) 1 each 0   Lancets Misc. MISC Use to check up to twice a day. May substitute to any manufacturer covered by patient's insurance. (Patient not taking: Reported on 07/16/2024) 100 each 3   triamcinolone  cream (KENALOG ) 0.1 % Apply 1 Application topically 2 (two) times daily. (Patient not taking: Reported on 07/16/2024) 30 g 0   No facility-administered medications prior to visit.    Allergies[1]  ROS Review of Systems  Constitutional:  Negative for appetite change, chills, fatigue and fever.  HENT:  Negative for congestion, postnasal drip, rhinorrhea and sneezing.   Respiratory:  Negative for cough, shortness of breath and wheezing.   Cardiovascular:  Negative for chest  pain, palpitations and leg swelling.  Gastrointestinal:  Negative for abdominal pain, constipation, nausea and vomiting.  Genitourinary:  Negative for difficulty urinating, dysuria, flank pain and frequency.  Musculoskeletal:  Negative for arthralgias, back pain, joint swelling and myalgias.  Skin:  Negative for color change, pallor, rash and wound.  Neurological:  Negative for dizziness, facial asymmetry, weakness, numbness and headaches.  Psychiatric/Behavioral:  Negative for behavioral problems, confusion, self-injury and suicidal ideas.       Objective:    Physical Exam Vitals and nursing note reviewed.  Constitutional:      General: She is not in acute distress.    Appearance: Normal appearance. She is obese. She is not ill-appearing, toxic-appearing or diaphoretic.  Eyes:     General: No scleral icterus.       Right eye: No discharge.        Left eye: No discharge.     Extraocular Movements: Extraocular movements intact.     Conjunctiva/sclera: Conjunctivae normal.  Cardiovascular:     Rate and Rhythm: Normal rate and regular rhythm.     Pulses: Normal pulses.     Heart sounds: Normal heart sounds. No murmur heard.    No friction rub. No gallop.  Pulmonary:     Effort: Pulmonary effort is normal. No respiratory distress.     Breath sounds: Normal breath sounds. No stridor. No wheezing, rhonchi or rales.  Chest:     Chest wall: No tenderness.  Abdominal:     General: There is no distension.     Palpations: Abdomen is soft.     Tenderness: There is no abdominal tenderness. There is no right CVA tenderness, left CVA tenderness or guarding.  Musculoskeletal:        General: No swelling, tenderness,  deformity or signs of injury.     Right lower leg: No edema.     Left lower leg: No edema.  Skin:    General: Skin is warm and dry.     Capillary Refill: Capillary refill takes less than 2 seconds.     Coloration: Skin is not jaundiced or pale.     Findings: No bruising,  erythema or lesion.  Neurological:     Mental Status: She is alert and oriented to person, place, and time.     Motor: No weakness.     Gait: Gait normal.  Psychiatric:        Mood and Affect: Mood normal.        Behavior: Behavior normal.        Thought Content: Thought content normal.        Judgment: Judgment normal.     BP (!) 142/78   Pulse 74   Temp (!) 97.3 F (36.3 C)   Wt (!) 310 lb (140.6 kg)   SpO2 99%   BMI 50.04 kg/m  Wt Readings from Last 3 Encounters:  07/16/24 (!) 310 lb (140.6 kg)  04/16/24 (!) 302 lb (137 kg)  02/22/24 295 lb (133.8 kg)    Lab Results  Component Value Date   TSH 1.680 12/14/2022   Lab Results  Component Value Date   WBC 5.5 01/10/2024   HGB 13.9 01/10/2024   HCT 42.0 01/10/2024   MCV 88 01/10/2024   PLT 269 01/10/2024   Lab Results  Component Value Date   NA 138 02/23/2024   K 4.2 02/23/2024   CO2 22 02/23/2024   GLUCOSE 142 (H) 02/23/2024   BUN 19 02/23/2024   CREATININE 0.78 02/23/2024   BILITOT 1.0 01/08/2023   ALKPHOS 102 01/08/2023   AST 43 (H) 01/08/2023   ALT 32 01/08/2023   PROT 8.5 (H) 01/08/2023   ALBUMIN 3.2 (L) 01/08/2023   CALCIUM  10.1 02/23/2024   ANIONGAP 13 01/11/2023   EGFR 87 02/23/2024   Lab Results  Component Value Date   CHOL 108 02/23/2024   Lab Results  Component Value Date   HDL 50 02/23/2024   Lab Results  Component Value Date   LDLCALC 28 02/23/2024   Lab Results  Component Value Date   TRIG 185 (H) 02/23/2024   Lab Results  Component Value Date   CHOLHDL 2.2 02/23/2024   Lab Results  Component Value Date   HGBA1C 8.7 (A) 07/16/2024      Assessment & Plan:   Problem List Items Addressed This Visit       Cardiovascular and Mediastinum   Hypertension        07/16/2024    3:18 PM 07/16/2024    3:07 PM 04/16/2024    3:21 PM 04/16/2024    3:12 PM 02/22/2024    4:09 PM 02/22/2024    3:58 PM 01/10/2024    3:43 PM  BP/Weight  Systolic BP 142 152 130 139 139 141 151   Diastolic BP 78 83 81 79 87 91 84  Wt. (Lbs)  310  302  295   BMI  50.04 kg/m2  48.74 kg/m2  47.61 kg/m2    Blood pressure remains above target despite current medication regimen. - Increased spironolactone  25mg  dailydaily pending lab results.  Continue atenolol  100 mg daily, hydrochlorothiazide  25 mg daily - Advised on heart-healthy, low salt, low fat diet and moderate exercise. - Recommended moderate to vigorous exercise at least 150 minutes weekly as tolerated  Relevant Medications   spironolactone  (ALDACTONE ) 25 MG tablet     Endocrine   Uncontrolled type 2 diabetes mellitus with hyperglycemia (HCC) - Primary   Lab Results  Component Value Date   HGBA1C 8.7 (A) 07/16/2024   Type 2 diabetes mellitus with hyperglycemia and diabetic neuropathy A1c improved but remains above target. Diabetic neuropathy present with sensory deficits in feet. - Increased Trulicity  to 4.5 mg weekly after current 3 mg supply. - Continue glimepiride  4 mg twice daily with meals. - Advised on dietary modifications to avoid sugar, sweet soda, bread, pasta, and rice. - Educated on foot care and importance of checking feet for wounds. - Discussed potential use of a CGM  Has not completed it a diabetic eye exam due to lack of insurance       Relevant Medications   Dulaglutide  (TRULICITY ) 4.5 MG/0.5ML SOAJ   spironolactone  (ALDACTONE ) 25 MG tablet   Other Relevant Orders   POCT glycosylated hemoglobin (Hb A1C) (Completed)   Basic Metabolic Panel   Direct LDL     Nervous and Auditory   Neuropathy of both feet    Educated on foot care and importance of checking feet for wounds        Other   Dyslipidemia, goal LDL below 70   Lab Results  Component Value Date   CHOL 108 02/23/2024   HDL 50 02/23/2024   LDLCALC 28 02/23/2024   LDLDIRECT 62 04/08/2023   TRIG 185 (H) 02/23/2024   CHOLHDL 2.2 02/23/2024   Dyslipidemia LDL cholesterol previously well-controlled. - Continue current  lipid-lowering therapy.  Zetia  10 mg daily, Crestor  40 mg daily - Will review cholesterol levels once lab results are available.       Relevant Medications   spironolactone  (ALDACTONE ) 25 MG tablet   Chronic pain of right knee   Meloxicam  15 mg daily as needed, gabapentin  100 mg 3 times daily Continue current medications      Morbid obesity (HCC)   Wt Readings from Last 3 Encounters:  07/16/24 (!) 310 lb (140.6 kg)  04/16/24 (!) 302 lb (137 kg)  02/22/24 295 lb (133.8 kg)   Body mass index is 50.04 kg/m.   BMI remains high, necessitating weight loss to improve health and manage comorbidities. - Advised on weight loss strategies including dietary modifications and increased physical activity         Relevant Medications   Dulaglutide  (TRULICITY ) 4.5 MG/0.5ML SOAJ    Meds ordered this encounter  Medications   Dulaglutide  (TRULICITY ) 4.5 MG/0.5ML SOAJ    Sig: Inject 4.5 mg as directed once a week.    Dispense:  6 mL    Refill:  1   spironolactone  (ALDACTONE ) 25 MG tablet    Sig: Take 1 tablet (25 mg total) by mouth daily.    Dispense:  90 tablet    Refill:  1    Follow-up: Return in about 3 months (around 10/14/2024) for HTN, DM, HYPERLIPIDEMIA.    Engelbert Sevin R Lorri Fukuhara, FNP     [1]  Allergies Allergen Reactions   Ace Inhibitors Swelling    Swelling of the lips   Tape Rash    No Band-Aids!!   "

## 2024-07-16 NOTE — Assessment & Plan Note (Signed)
 Meloxicam  15 mg daily as needed, gabapentin  100 mg 3 times daily Continue current medications

## 2024-07-16 NOTE — Assessment & Plan Note (Signed)
 Lab Results  Component Value Date   CHOL 108 02/23/2024   HDL 50 02/23/2024   LDLCALC 28 02/23/2024   LDLDIRECT 62 04/08/2023   TRIG 185 (H) 02/23/2024   CHOLHDL 2.2 02/23/2024   Dyslipidemia LDL cholesterol previously well-controlled. - Continue current lipid-lowering therapy.  Zetia  10 mg daily, Crestor  40 mg daily - Will review cholesterol levels once lab results are available.

## 2024-07-16 NOTE — Assessment & Plan Note (Addendum)
" ° ° °    07/16/2024    3:18 PM 07/16/2024    3:07 PM 04/16/2024    3:21 PM 04/16/2024    3:12 PM 02/22/2024    4:09 PM 02/22/2024    3:58 PM 01/10/2024    3:43 PM  BP/Weight  Systolic BP 142 152 130 139 139 141 151  Diastolic BP 78 83 81 79 87 91 84  Wt. (Lbs)  310  302  295   BMI  50.04 kg/m2  48.74 kg/m2  47.61 kg/m2    Blood pressure remains above target despite current medication regimen. - Increased spironolactone  25mg  dailydaily pending lab results.  Continue atenolol  100 mg daily, hydrochlorothiazide  25 mg daily - Advised on heart-healthy, low salt, low fat diet and moderate exercise. - Recommended moderate to vigorous exercise at least 150 minutes weekly as tolerated  "

## 2024-07-16 NOTE — Assessment & Plan Note (Addendum)
 Lab Results  Component Value Date   HGBA1C 8.7 (A) 07/16/2024   Type 2 diabetes mellitus with hyperglycemia and diabetic neuropathy A1c improved but remains above target. Diabetic neuropathy present with sensory deficits in feet. - Increased Trulicity  to 4.5 mg weekly after current 3 mg supply. - Continue glimepiride  4 mg twice daily with meals. - Advised on dietary modifications to avoid sugar, sweet soda, bread, pasta, and rice. - Educated on foot care and importance of checking feet for wounds. - Discussed potential use of a CGM  Has not completed it a diabetic eye exam due to lack of insurance

## 2024-07-16 NOTE — Patient Instructions (Signed)
 Around 3 times per week, check your blood pressure 2 times per day. once in the morning and once in the evening. The readings should be at least one minute apart. Write down these values and bring them to your next nurse visit/appointment.  When you check your BP, make sure you have been doing something calm/relaxing 5 minutes prior to checking. Both feet should be flat on the floor and you should be sitting. Use your left arm and make sure it is in a relaxed position (on a table), and that the cuff is at the approximate level/height of your heart. Blood pressure goal is less than 130/80   1. Uncontrolled type 2 diabetes mellitus with hyperglycemia (HCC) (Primary) - POCT glycosylated hemoglobin (Hb A1C) - Basic Metabolic Panel - Direct LDL - Dulaglutide  (TRULICITY ) 4.5 MG/0.5ML SOAJ; Inject 4.5 mg as directed once a week.  Dispense: 6 mL; Refill: 1 - spironolactone  (ALDACTONE ) 25 MG tablet; Take 1 tablet (25 mg total) by mouth daily.  Dispense: 90 tablet; Refill: 1  2. Primary hypertension - spironolactone  (ALDACTONE ) 25 MG tablet; Take 1 tablet (25 mg total) by mouth daily.  Dispense: 90 tablet; Refill: 1    It is important that you exercise regularly at least 30 minutes 5 times a week as tolerated  Think about what you will eat, plan ahead. Choose  clean, green, fresh or frozen over canned, processed or packaged foods which are more sugary, salty and fatty. 70 to 75% of food eaten should be vegetables and fruit. Three meals at set times with snacks allowed between meals, but they must be fruit or vegetables. Aim to eat over a 12 hour period , example 7 am to 7 pm, and STOP after  your last meal of the day. Drink water,generally about 64 ounces per day, no other drink is as healthy. Fruit juice is best enjoyed in a healthy way, by EATING the fruit.  Thanks for choosing [facility] we consider it a privelige to serve you.

## 2024-07-17 ENCOUNTER — Ambulatory Visit: Payer: Self-pay | Admitting: Nurse Practitioner

## 2024-07-17 LAB — BASIC METABOLIC PANEL WITH GFR
BUN/Creatinine Ratio: 16 (ref 9–23)
BUN: 12 mg/dL (ref 6–24)
CO2: 25 mmol/L (ref 20–29)
Calcium: 9.8 mg/dL (ref 8.7–10.2)
Chloride: 102 mmol/L (ref 96–106)
Creatinine, Ser: 0.75 mg/dL (ref 0.57–1.00)
Glucose: 94 mg/dL (ref 70–99)
Potassium: 4.1 mmol/L (ref 3.5–5.2)
Sodium: 138 mmol/L (ref 134–144)
eGFR: 92 mL/min/1.73

## 2024-07-17 LAB — LDL CHOLESTEROL, DIRECT: LDL Direct: 52 mg/dL (ref 0–99)

## 2024-08-15 ENCOUNTER — Other Ambulatory Visit (INDEPENDENT_AMBULATORY_CARE_PROVIDER_SITE_OTHER): Payer: Self-pay

## 2024-08-15 ENCOUNTER — Other Ambulatory Visit: Payer: Self-pay

## 2024-08-15 VITALS — BP 124/84

## 2024-08-15 DIAGNOSIS — E1165 Type 2 diabetes mellitus with hyperglycemia: Secondary | ICD-10-CM

## 2024-08-15 DIAGNOSIS — I10 Essential (primary) hypertension: Secondary | ICD-10-CM

## 2024-08-15 NOTE — Progress Notes (Unsigned)
 "  08/16/2024 Name: Chalon Zobrist MRN: [id number] DOB: September 03, 1965  Chief Complaint  Patient presents with   Diabetes   Hypertension    Gearl Kimbrough is a 59 y.o. year old female who presented for a telephone visit.   They were referred to the pharmacist by their PCP for assistance in managing diabetes, hypertension, and hyperlipidemia. PMH includes asthma, HTN, T2DM, HLD, gout, BMI > 45.    Subjective: Patient was last seen by PCP, Lorice Shall, NP on 07/16/24. A1C was 8.7%. BP was 142/78 mmHg. Trulicity  was increased to 4.5 mg weekly. Spironolactone  was increased to 25 mg daily. LDL-C was 52 mg/dL, at goal.   Today, patient reports she is doing well. No issues with increase in Trulicity  to 4.5 mg weekly. Denies nausea, vomiting, constipation, abdominal pain. Now taking full tab of spironolactone .   Care Team: Primary Care Provider: Paseda, Folashade R, FNP ; Next Scheduled Visit: 07/16/24 - confirms appt today  Medication Access/Adherence  Current Pharmacy:  [facility] [address], [address] [name] [address] Phone: [phone] Fax: [phone]   Patient reports affordability concerns with their medications: Yes  - lost insurance and had not been taking her medications from February to June. She reports she has applied for Medicaid in the past but it has been several years. She used [facility] to pick up her medications at [facility] and has successfully picked up refills since then. Also using [facility] at [facility].  Patient reports access/transportation concerns to their pharmacy: No  - [facility] pharmacy is more convenient for her, but she is willing to go to [facility] at [facility] for [facility] Trulicity   Patient reports adherence concerns with their medications:  Yes   - hx of nonadherence due to financial concerns and lack of communication   Diabetes:  Current medications: glimepiride  4 mg BID, Trulicity  4.5 mg weekly  (Fridays)  Medications tried in the past: metformin XR/IR - nausea and vomiting, Victoza  - did well, lost insurance and had to stop  Denies GI AE since increasing Trulicity  to 4.5 mg.   Current glucose readings: has meter, but has not been checking. Not willing to start checking despite multiple conversations. Checked during our call today - post-prandial BG of 172 mg/dL  Patient denies hypoglycemic s/sx including dizziness, shakiness, sweating. Patient denies hyperglycemic symptoms including polyuria, polydipsia, polyphagia, nocturia, neuropathy, blurred vision.  Current meal patterns: Has been difficult to cut out sweet foods. Feels like Trulicity  is helping. Tries to eat 3 meals every day.  Breakfast: grits (less than 1/2 cup) and eggs Lunch: sandwich Dinner: chicken, salad  Drinks: water only, occasional ginger ale or juice.  Current physical activity: walking back and forth at home, nothing intentional   Current medication access support: [facility]  from [facility]  Hypertension:  Current medications: atenolol  100 mg daily, hydrochlorothiazide  25 mg daily, spironolactone  25 mg daily Medications previously tried: lip/tongue swelling with lisinopril, amlodipine  - LE swelling, chlorthalidone  - hypokalemia   Patient has a validated, automated, upper arm home BP cuff - has not checked recently. Checked during call today L arm : 124/84 mmHg R arm: 136/71 mmHg  Patient denies hypotensive s/sx including dizziness, lightheadedness.  Patient denies hypertensive symptoms including headache, chest pain, shortness of breath.   Hyperlipidemia/ASCVD Risk Reduction  Current lipid lowering medications: rosuvastatin  40 mg daily, ezetimibe  10 mg daily  Antiplatelet regimen: aspirin  81 mg daily  ASCVD History: none Family History: MI in Father, MI in sister  Risk Factors: T2DM, HTN, HLD   Clinical ASCVD: No  The ASCVD Risk score (Arnett DK, et al., 2019) failed to calculate for the  following reasons:   The valid total cholesterol range is 130 to 320 mg/dL     Objective:  BP Readings from Last 3 Encounters:  08/16/24 124/84  07/16/24 (!) 142/78  04/16/24 130/81   Lab Results  Component Value Date   HGBA1C 8.7 (A) 07/16/2024   HGBA1C 10.1 (A) 04/16/2024   HGBA1C 12.8 (A) 01/10/2024    Lab Results  Component Value Date   CREATININE 0.75 07/16/2024   BUN 12 07/16/2024   NA 138 07/16/2024   K 4.1 07/16/2024   CL 102 07/16/2024   CO2 25 07/16/2024    Lab Results  Component Value Date   CHOL 108 02/23/2024   HDL 50 02/23/2024   LDLCALC 28 02/23/2024   LDLDIRECT 52 07/16/2024   TRIG 185 (H) 02/23/2024   CHOLHDL 2.2 02/23/2024    Medications Reviewed Today     Reviewed by Brinda Lorain SQUIBB, RPH-CPP (Pharmacist) on 08/15/24 at 1735  Med List Status: <None>   Medication Order Taking? Sig Documenting Provider Last Dose Status Informant  acetaminophen  (TYLENOL ) 325 MG tablet [id number]  Take 325-650 mg by mouth every 6 (six) hours as needed for mild pain, fever or headache.  [provider]  Active Self           Med Note BUZZ, DOROTHYANN ONEIDA Schaumann Dec 23, 2022 10:24 AM)    albuterol  (VENTOLIN  HFA) 108 [phone] Base) MCG/ACT inhaler [id number]  Inhale 1-2 puffs into the lungs every 6 (six) hours as needed for wheezing or shortness of breath. Please dispense 3 months supple. Stroud, Natalie M, FNP  Active            Med Note SAMULE, [facility]   Fri Feb 25, 2023 10:56 AM) Prn   allopurinol  (ZYLOPRIM ) 300 MG tablet [id number]  Take 1 tablet (300 mg total) by mouth daily. Paseda, Folashade R, FNP  Active   aspirin  EC 81 MG tablet [id number]  Take 1 tablet (81 mg total) by mouth daily. Stroud, Natalie M, FNP  Active   atenolol  (TENORMIN ) 100 MG tablet [id number] Yes Take 1 tablet (100 mg total) by mouth daily. Paseda, Folashade R, FNP  Active    Patient not taking:   Discontinued 08/15/24 1445 (No longer needed (for PRN medications))   cetirizine (ZYRTEC) 10 MG  tablet [id number]  Take 10 mg by mouth daily. [provider]  Active   Cholecalciferol  (VITAMIN D3) 125 MCG (5000 UT) CAPS [id number]  Take 1 capsule (5,000 Units total) by mouth daily. Stroud, Natalie M, FNP  Active   Dulaglutide  (TRULICITY ) 4.5 MG/0.5ML EMMANUEL [id number] Yes Inject 4.5 mg as directed once a week. Paseda, Folashade R, FNP  Active   ezetimibe  (ZETIA ) 10 MG tablet [id number] Yes Take 1 tablet (10 mg total) by mouth daily. Paseda, Folashade R, FNP  Active   famotidine  (PEPCID ) 20 MG tablet [id number]  Take 1 tablet (20 mg total) by mouth daily. Myrna Camelia HERO, NP  Active   ferrous sulfate  325 (65 FE) MG tablet [id number]  Take 1 tablet (325 mg total) by mouth daily with breakfast. Stroud, Natalie M, FNP  Active   fluticasone  (FLONASE ) 50 MCG/ACT nasal spray [id number]  Place 2 sprays into both nostrils daily as needed for allergies or rhinitis. Stroud, Natalie M, FNP  Active  Med Note BUZZ, CATHERINE T   Thu Dec 23, 2022 10:24 AM)    gabapentin  (NEURONTIN ) 100 MG capsule [id number]  Take 1 capsule (100 mg total) by mouth 3 (three) times daily. Paseda, Folashade R, FNP  Active   glimepiride  (AMARYL ) 4 MG tablet [id number] Yes Take 1 tablet (4 mg total) by mouth 2 (two) times daily. Paseda, Folashade R, FNP  Active   Glucose Blood (BLOOD GLUCOSE TEST STRIPS) STRP [id number]  Use to check up to twice a day. May substitute to any manufacturer covered by patient's insurance.  Patient not taking: Reported on 07/16/2024   Paseda, Folashade R, FNP  Active   hydrochlorothiazide  (HYDRODIURIL ) 25 MG tablet [id number] Yes TAKE ONE TABLET BY MOUTH ONE TIME DAILY Paseda, Folashade R, FNP  Active    Patient not taking:   Discontinued 08/15/24 1445 (No longer needed (for PRN medications))   Lancets Misc. MISC [id number]  Use to check up to twice a day. May substitute to any manufacturer covered by patient's insurance.  Patient not taking: Reported on 07/16/2024   Paseda, Folashade R, FNP   Active   meloxicam  (MOBIC ) 15 MG tablet [id number]  Take 1 tablet (15 mg total) by mouth daily as needed for pain. Paseda, Folashade R, FNP  Active   Multiple Vitamins-Minerals (CENTRUM SILVER  50+WOMEN) TABS [id number]  Take 1 tablet by mouth daily. Myrna Camelia HERO, NP  Active   Omega-3 Fatty Acids (FISH OIL PO) [id number]  Take 1 capsule by mouth daily. [provider]  Active   Probiotic Product (PROBIOTIC DAILY PO) [id number]  Take 1 capsule by mouth daily. [provider]  Active Self  rosuvastatin  (CRESTOR ) 40 MG tablet [id number] Yes Take 1 tablet (40 mg total) by mouth daily. Paseda, Folashade R, FNP  Active   spironolactone  (ALDACTONE ) 25 MG tablet [id number] Yes Take 1 tablet (25 mg total) by mouth daily. Paseda, Folashade R, FNP  Active   triamcinolone  cream (KENALOG ) 0.1 % [id number]  Apply 1 Application topically 2 (two) times daily.  Patient not taking: Reported on 07/16/2024   Paseda, Folashade R, FNP  Active               Assessment/Plan:   Diabetes: - Currently uncontrolled with most recent A1C of 8.7% above goal < 7%, but steadily improving. She recently increased Trulicity  to 4.5 mg weekly. She is not having overt s/sx of hyperglycemia, so feel it is appropriate to continue this regimen for now without addition of basal insulin , though may be required in the future. Continued to encourage healthy diet choices to control blood sugar. Now that A1C is < 9%, could consider addition of SGLT2i for additional glycemic control and kidney protection given microalbuminuria. - Last UACR June 2025 - 564 mg/g - Reviewed long term cardiovascular and renal outcomes of uncontrolled blood sugar - Reviewed goal A1c, goal fasting, and goal 2 hour post prandial glucose - Reviewed hypoglycemia management plan and the rule of 15 - Reviewed dietary modifications including  utilizing the healthy plate method, limiting portion size of carbohydrate foods, increasing intake of protein  and non-starchy vegetables. Counseled patient to stay hydrated with water throughout the day. - Reviewed lifestyle modifications including: aiming for 150 minutes of moderate intensity exercise every week.  - Recommend to continue Trulicity  3 mg weekly - Recommend to continue glimepiride  4 mg BID with meals - Recommend to check glucose once daily fasting - Next A1C due 10/15/23   Hypertension: - Currently uncontrolled with recent home and  clinic BP above goal < 130/80 mmHg. Home BP today improved compared to clinic reading. Suspect patient was not fulling resting during today's readings. Last BMP was stable.She is unable to take ACEi/ARB due to a hx of angioedema and had issues with LE swelling with amlodipine  in the past.  - Reviewed long term cardiovascular and renal outcomes of uncontrolled blood pressure - Reviewed appropriate blood pressure monitoring technique and reviewed goal blood pressure. Recommended to check home blood pressure and heart rate once daily every day. She should bring log to all her appointments. - Recommend to continue atenolol  100 mg daily, hydrochlorothiazide  25 mg daily, spironolactone  25 mg daily  - Repeat BMET in next couple weeks after increase in spironolactone    Hyperlipidemia/ASCVD Risk Reduction: - Currently controlled with last LDL-C 52 mg/dL below goal < 70 mg/dL. High intensity statin indicated. Patient tolerating well.  - Reviewed long term complications of uncontrolled cholesterol - Recommend to continue rosuvastatin  40 mg daily, ezetimibe  10 mg daily  Follow Up Plan: PCP 10/16/24, PharmD telephone 09/26/24   Lorain Baseman, PharmD [facility] [phone]  "

## 2024-08-16 ENCOUNTER — Other Ambulatory Visit: Payer: Self-pay

## 2024-08-30 ENCOUNTER — Other Ambulatory Visit: Payer: Self-pay

## 2024-08-31 ENCOUNTER — Other Ambulatory Visit: Payer: Self-pay

## 2024-09-26 ENCOUNTER — Other Ambulatory Visit: Payer: Self-pay

## 2024-10-01 ENCOUNTER — Other Ambulatory Visit: Payer: Self-pay

## 2024-10-16 ENCOUNTER — Ambulatory Visit: Payer: Self-pay | Admitting: Nurse Practitioner
